# Patient Record
Sex: Female | Born: 1950 | Race: White | Hispanic: No | Smoking: Current every day smoker
Health system: Southern US, Community
[De-identification: ages and names within clinical notes are randomized; demographics above are authoritative.]

## PROBLEM LIST (undated history)

## (undated) DIAGNOSIS — R911 Solitary pulmonary nodule: Secondary | ICD-10-CM

## (undated) DIAGNOSIS — H269 Unspecified cataract: Secondary | ICD-10-CM

## (undated) DIAGNOSIS — J111 Influenza due to unidentified influenza virus with other respiratory manifestations: Secondary | ICD-10-CM

## (undated) DIAGNOSIS — J449 Chronic obstructive pulmonary disease, unspecified: Secondary | ICD-10-CM

## (undated) DIAGNOSIS — Z720 Tobacco use: Secondary | ICD-10-CM

## (undated) DIAGNOSIS — I10 Essential (primary) hypertension: Secondary | ICD-10-CM

## (undated) DIAGNOSIS — E785 Hyperlipidemia, unspecified: Secondary | ICD-10-CM

## (undated) DIAGNOSIS — J9600 Acute respiratory failure, unspecified whether with hypoxia or hypercapnia: Secondary | ICD-10-CM

## (undated) DIAGNOSIS — T7840XA Allergy, unspecified, initial encounter: Secondary | ICD-10-CM

## (undated) DIAGNOSIS — J189 Pneumonia, unspecified organism: Secondary | ICD-10-CM

## (undated) DIAGNOSIS — J42 Unspecified chronic bronchitis: Secondary | ICD-10-CM

## (undated) DIAGNOSIS — J302 Other seasonal allergic rhinitis: Secondary | ICD-10-CM

## (undated) DIAGNOSIS — M1990 Unspecified osteoarthritis, unspecified site: Secondary | ICD-10-CM

## (undated) HISTORY — PX: TUBAL LIGATION: SHX77

## (undated) HISTORY — PX: COLONOSCOPY: SHX174

## (undated) HISTORY — PX: DILATION AND CURETTAGE OF UTERUS: SHX78

## (undated) HISTORY — PX: EYE SURGERY: SHX253

## (undated) HISTORY — DX: Essential (primary) hypertension: I10

## (undated) HISTORY — PX: LUMBAR LAMINECTOMY: SHX95

## (undated) HISTORY — DX: Allergy, unspecified, initial encounter: T78.40XA

## (undated) HISTORY — DX: Hyperlipidemia, unspecified: E78.5

## (undated) HISTORY — DX: Unspecified cataract: H26.9

## (undated) HISTORY — DX: Chronic obstructive pulmonary disease, unspecified: J44.9

## (undated) HISTORY — PX: GANGLION CYST EXCISION: SHX1691

## (undated) HISTORY — DX: Other seasonal allergic rhinitis: J30.2

## (undated) HISTORY — PX: SPINE SURGERY: SHX786

## (undated) HISTORY — DX: Unspecified osteoarthritis, unspecified site: M19.90

---
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
## 1970-06-19 HISTORY — PX: OTHER SURGICAL HISTORY: SHX169

## 1996-06-19 HISTORY — PX: CHOLECYSTECTOMY: SHX55

## 1998-08-31 ENCOUNTER — Other Ambulatory Visit: Admission: RE | Admit: 1998-08-31 | Discharge: 1998-08-31 | Payer: Self-pay | Admitting: Gynecology

## 1999-12-01 ENCOUNTER — Other Ambulatory Visit: Admission: RE | Admit: 1999-12-01 | Discharge: 1999-12-01 | Payer: Self-pay | Admitting: Gynecology

## 2000-11-13 ENCOUNTER — Other Ambulatory Visit: Admission: RE | Admit: 2000-11-13 | Discharge: 2000-11-13 | Payer: Self-pay | Admitting: Gynecology

## 2002-06-19 HISTORY — PX: ROTATOR CUFF REPAIR: SHX139

## 2006-06-19 HISTORY — PX: MENISCECTOMY: SHX123

## 2012-06-19 HISTORY — PX: BREAST BIOPSY: SHX20

## 2012-10-22 DIAGNOSIS — E785 Hyperlipidemia, unspecified: Secondary | ICD-10-CM | POA: Insufficient documentation

## 2012-11-13 DIAGNOSIS — R079 Chest pain, unspecified: Secondary | ICD-10-CM | POA: Insufficient documentation

## 2013-01-22 DIAGNOSIS — D249 Benign neoplasm of unspecified breast: Secondary | ICD-10-CM | POA: Insufficient documentation

## 2014-11-23 DIAGNOSIS — M25561 Pain in right knee: Secondary | ICD-10-CM | POA: Diagnosis not present

## 2014-11-23 DIAGNOSIS — M25562 Pain in left knee: Secondary | ICD-10-CM | POA: Diagnosis not present

## 2014-11-23 DIAGNOSIS — M1990 Unspecified osteoarthritis, unspecified site: Secondary | ICD-10-CM | POA: Diagnosis not present

## 2014-12-08 DIAGNOSIS — M7122 Synovial cyst of popliteal space [Baker], left knee: Secondary | ICD-10-CM | POA: Diagnosis not present

## 2014-12-08 DIAGNOSIS — M25561 Pain in right knee: Secondary | ICD-10-CM | POA: Diagnosis not present

## 2014-12-08 DIAGNOSIS — M13861 Other specified arthritis, right knee: Secondary | ICD-10-CM | POA: Diagnosis not present

## 2014-12-08 DIAGNOSIS — M13862 Other specified arthritis, left knee: Secondary | ICD-10-CM | POA: Diagnosis not present

## 2014-12-08 DIAGNOSIS — M1712 Unilateral primary osteoarthritis, left knee: Secondary | ICD-10-CM | POA: Diagnosis not present

## 2014-12-09 DIAGNOSIS — Z1231 Encounter for screening mammogram for malignant neoplasm of breast: Secondary | ICD-10-CM | POA: Diagnosis not present

## 2015-01-25 DIAGNOSIS — M1990 Unspecified osteoarthritis, unspecified site: Secondary | ICD-10-CM | POA: Diagnosis not present

## 2016-01-10 DIAGNOSIS — Z23 Encounter for immunization: Secondary | ICD-10-CM | POA: Diagnosis not present

## 2016-01-10 DIAGNOSIS — I10 Essential (primary) hypertension: Secondary | ICD-10-CM | POA: Diagnosis not present

## 2016-01-10 DIAGNOSIS — Z6829 Body mass index (BMI) 29.0-29.9, adult: Secondary | ICD-10-CM | POA: Diagnosis not present

## 2016-01-10 DIAGNOSIS — Z0000 Encounter for general adult medical examination without abnormal findings: Secondary | ICD-10-CM | POA: Diagnosis not present

## 2016-01-12 ENCOUNTER — Other Ambulatory Visit (HOSPITAL_COMMUNITY): Payer: Self-pay | Admitting: Internal Medicine

## 2016-01-12 DIAGNOSIS — Z6829 Body mass index (BMI) 29.0-29.9, adult: Secondary | ICD-10-CM | POA: Diagnosis not present

## 2016-01-12 DIAGNOSIS — E663 Overweight: Secondary | ICD-10-CM | POA: Diagnosis not present

## 2016-01-12 DIAGNOSIS — Z23 Encounter for immunization: Secondary | ICD-10-CM | POA: Diagnosis not present

## 2016-01-12 DIAGNOSIS — Z1389 Encounter for screening for other disorder: Secondary | ICD-10-CM | POA: Diagnosis not present

## 2016-01-12 DIAGNOSIS — E039 Hypothyroidism, unspecified: Secondary | ICD-10-CM | POA: Diagnosis not present

## 2016-01-12 DIAGNOSIS — Z1231 Encounter for screening mammogram for malignant neoplasm of breast: Secondary | ICD-10-CM

## 2016-01-14 ENCOUNTER — Ambulatory Visit (HOSPITAL_COMMUNITY)
Admission: RE | Admit: 2016-01-14 | Discharge: 2016-01-14 | Disposition: A | Discharge status: Home / Self Care | Payer: Medicare Other | Source: Ambulatory Visit | Attending: Internal Medicine | Admitting: Internal Medicine

## 2016-01-14 DIAGNOSIS — Z1231 Encounter for screening mammogram for malignant neoplasm of breast: Secondary | ICD-10-CM

## 2016-02-25 ENCOUNTER — Ambulatory Visit: Payer: Self-pay | Admitting: Internal Medicine

## 2016-02-25 ENCOUNTER — Ambulatory Visit (INDEPENDENT_AMBULATORY_CARE_PROVIDER_SITE_OTHER): Payer: Medicare Other | Admitting: Gastroenterology

## 2016-02-25 ENCOUNTER — Encounter: Payer: Self-pay | Admitting: Gastroenterology

## 2016-02-25 ENCOUNTER — Other Ambulatory Visit: Payer: Self-pay

## 2016-02-25 DIAGNOSIS — Z86010 Personal history of colonic polyps: Secondary | ICD-10-CM

## 2016-02-25 MED ORDER — PEG 3350-KCL-NA BICARB-NACL 420 G PO SOLR: 4000.0000 mL | ORAL | 0 refills | Status: DC

## 2016-02-25 NOTE — Progress Notes (Signed)
Primary Care Physician:  Glo Herring., MD  Primary Gastroenterologist:  Garfield Cornea, MD   Chief Complaint  Patient presents with  . Colonoscopy    hx benign polyps    HPI:  Erin Guerrero is a 65 y.o. female here To schedule surveillance colonoscopy. She states she has had 2 previous colonoscopies, last one 8 years ago [facility]). She has had history of colon polyps. At one point she was told follow-up in 5 years. Records are unavailable. She has been doing well from a GI standpoint. Regular bowel movements. Only occasional constipation. Denies blood in the stool or melena. No heartburn, dysphagia, vomiting, abdominal pain. No family history of colon cancer.  Current Outpatient Prescriptions  Medication Sig Dispense Refill  . Calcium Carbonate Antacid (TUMS ULTRA 1000 PO) Take 2 tablets by mouth daily.    . cetirizine (ZYRTEC) 10 MG tablet Take 10 mg by mouth daily.    . Cholecalciferol (VITAMIN D) 2000 units CAPS Take 1 capsule by mouth daily.    . irbesartan (AVAPRO) 150 MG tablet Take 1 tablet by mouth daily.    Marland Kitchen triamcinolone (NASACORT) 55 MCG/ACT AERO nasal inhaler Place 2 sprays into the nose as needed.     No current facility-administered medications for this visit.     Allergies as of 02/25/2016 - Review Complete 02/25/2016  Allergen Reaction Noted  . Diphenhydramine hcl Anaphylaxis 10/22/2012  . Morphine Rash 10/22/2012  . Other  02/25/2016  . Statins Other (See Comments) 02/25/2016  . Benzocaine Rash 10/22/2012  . Latex Rash 02/25/2016  . Sulfa antibiotics Rash 10/22/2012  . Triclosan Rash 10/22/2012    Past Medical History:  Diagnosis Date  . HTN (hypertension)   . Osteoarthritis   . Seasonal allergies     Past Surgical History:  Procedure Laterality Date  . BREAST BIOPSY  2014   Benign  . Cervical cryo-treatment  1972  . CHOLECYSTECTOMY  1998  . COLONOSCOPY     Polyps  . GANGLION CYST EXCISION  1970   Left wrist  . GANGLION CYST EXCISION      Right hand and left ring finger  . [place]  . MENISCECTOMY  2008  . ROTATOR CUFF REPAIR  2004   Right    Family History  Problem Relation Age of Onset  . Cancer Mother 35    Question cervical/ovarian  . Kidney failure Sister 83    born with one kidney, other kidney injured by mva  . Kidney failure Sister   . Lupus Sister   . Diabetes Sister   . Thyroid cancer Daughter   . Celiac disease Neg Hx   . Inflammatory bowel disease Neg Hx     Social History   Social History  . Marital status: Widowed    Spouse name: N/A  . Number of children: N/A  . Years of education: N/A   Occupational History  . PT for [employer], OR/ER nurse    Social History Main Topics  . Smoking status: Current Every Day Smoker  . Smokeless tobacco: Never Used  . Alcohol use No  . Drug use: No  . Sexual activity: Not on file   Other Topics Concern  . Not on file   Social History Narrative  . No narrative on file      ROS:  General: Negative for anorexia, weight loss, fever, chills, fatigue, weakness. Eyes: Negative for vision changes.  ENT: Negative for hoarseness, difficulty swallowing , nasal congestion. CV: Negative  for chest pain, angina, palpitations, dyspnea on exertion, peripheral edema.  Respiratory: Negative for dyspnea at rest, dyspnea on exertion, cough, sputum, wheezing.  GI: See history of present illness. GU:  Negative for dysuria, hematuria, urinary incontinence, urinary frequency, nocturnal urination.  MS: Negative for joint pain, low back pain.  Derm: Negative for rash or itching.  Neuro: Negative for weakness, abnormal sensation, seizure, frequent headaches, memory loss, confusion.  Psych: Negative for anxiety, depression, suicidal ideation, hallucinations.  Endo: Negative for unusual weight change.  Heme: Negative for bruising or bleeding. Allergy: Negative for rash or hives.    Physical Examination:  BP 140/84   Pulse 76   Temp 97.7 F (36.5  C) (Oral)   Ht 5\' 3"  (1.6 m)   Wt 174 lb 12.8 oz (79.3 kg)   BMI 30.96 kg/m    General: Well-nourished, well-developed in no acute distress.  Head: Normocephalic, atraumatic.   Eyes: Conjunctiva pink, no icterus. Mouth: Oropharyngeal mucosa moist and pink , no lesions erythema or exudate. Neck: Supple without thyromegaly, masses, or lymphadenopathy.  Lungs: Clear to auscultation bilaterally.  Heart: Regular rate and rhythm, no murmurs rubs or gallops.  Abdomen: Bowel sounds are normal, nontender, nondistended, no hepatosplenomegaly or masses, no abdominal bruits or    hernia , no rebound or guarding.   Rectal: Deferred Extremities: No lower extremity edema. No clubbing or deformities.  Neuro: Alert and oriented x 4 , grossly normal neurologically.  Skin: Warm and dry, no rash or jaundice.   Psych: Alert and cooperative, normal mood and affect.

## 2016-02-25 NOTE — Patient Instructions (Signed)
1. Colonoscopy as scheduled. Please see separate instructions. 

## 2016-02-25 NOTE — Progress Notes (Signed)
cc'ed to pcp °

## 2016-02-25 NOTE — Assessment & Plan Note (Signed)
65 year old lady who gives a history of colonic polyps, stating she is overdue for her surveillance colonoscopy. Her last colonoscopy was in 2009 per patient, [place]. Plan on a colonoscopy in the near future.  I have discussed the risks, alternatives, benefits with regards to but not limited to the risk of reaction to medication, bleeding, infection, perforation and the patient is agreeable to proceed. Written consent to be obtained.

## 2016-02-28 ENCOUNTER — Other Ambulatory Visit: Payer: Self-pay

## 2016-02-28 ENCOUNTER — Telehealth: Payer: Self-pay

## 2016-02-28 NOTE — Telephone Encounter (Signed)
Noted  

## 2016-02-28 NOTE — Telephone Encounter (Signed)
Pt called to change her TCS from this Thursday to 03/16/16 @ 8:30 am. New instructions are going out in the mail.

## 2016-03-16 ENCOUNTER — Encounter (HOSPITAL_COMMUNITY): Payer: Self-pay | Admitting: *Deleted

## 2016-03-16 ENCOUNTER — Ambulatory Visit (HOSPITAL_COMMUNITY)
Admission: RE | Admit: 2016-03-16 | Discharge: 2016-03-16 | Disposition: A | Discharge status: Home / Self Care | Payer: Medicare Other | Source: Ambulatory Visit | Attending: Internal Medicine | Admitting: Internal Medicine

## 2016-03-16 ENCOUNTER — Encounter (HOSPITAL_COMMUNITY)
Admission: RE | Disposition: A | Discharge status: Home / Self Care | Payer: Self-pay | Source: Ambulatory Visit | Attending: Internal Medicine

## 2016-03-16 DIAGNOSIS — D120 Benign neoplasm of cecum: Secondary | ICD-10-CM

## 2016-03-16 DIAGNOSIS — Z86010 Personal history of colonic polyps: Secondary | ICD-10-CM | POA: Diagnosis not present

## 2016-03-16 DIAGNOSIS — Z79899 Other long term (current) drug therapy: Secondary | ICD-10-CM | POA: Diagnosis not present

## 2016-03-16 DIAGNOSIS — J302 Other seasonal allergic rhinitis: Secondary | ICD-10-CM | POA: Diagnosis not present

## 2016-03-16 DIAGNOSIS — F17200 Nicotine dependence, unspecified, uncomplicated: Secondary | ICD-10-CM | POA: Insufficient documentation

## 2016-03-16 DIAGNOSIS — K5730 Diverticulosis of large intestine without perforation or abscess without bleeding: Secondary | ICD-10-CM | POA: Diagnosis not present

## 2016-03-16 DIAGNOSIS — Z1211 Encounter for screening for malignant neoplasm of colon: Secondary | ICD-10-CM | POA: Insufficient documentation

## 2016-03-16 DIAGNOSIS — I10 Essential (primary) hypertension: Secondary | ICD-10-CM | POA: Diagnosis not present

## 2016-03-16 HISTORY — PX: COLONOSCOPY: SHX5424

## 2016-03-16 HISTORY — PX: POLYPECTOMY: SHX5525

## 2016-03-16 SURGERY — COLONOSCOPY
Anesthesia: Moderate Sedation

## 2016-03-16 MED ORDER — MEPERIDINE HCL 100 MG/ML IJ SOLN
INTRAMUSCULAR | Status: DC
Start: 2016-03-16 — End: 2016-03-16
  Filled 2016-03-16: qty 2

## 2016-03-16 MED ORDER — ONDANSETRON HCL 4 MG/2ML IJ SOLN
INTRAMUSCULAR | Status: DC | PRN
  Administered 2016-03-16: 4 mg via INTRAVENOUS

## 2016-03-16 MED ORDER — ONDANSETRON HCL 4 MG/2ML IJ SOLN
INTRAMUSCULAR | Status: AC
  Filled 2016-03-16: qty 2

## 2016-03-16 MED ORDER — MIDAZOLAM HCL 5 MG/5ML IJ SOLN
INTRAMUSCULAR | Status: DC | PRN
  Administered 2016-03-16 (×2): 2 mg via INTRAVENOUS
  Administered 2016-03-16: 1 mg via INTRAVENOUS

## 2016-03-16 MED ORDER — MEPERIDINE HCL 100 MG/ML IJ SOLN
INTRAMUSCULAR | Status: DC | PRN
  Administered 2016-03-16 (×2): 50 mg via INTRAVENOUS

## 2016-03-16 MED ORDER — SODIUM CHLORIDE 0.9 % IV SOLN
INTRAVENOUS | Status: DC
  Administered 2016-03-16: 1000 mL via INTRAVENOUS

## 2016-03-16 MED ORDER — MIDAZOLAM HCL 5 MG/5ML IJ SOLN
INTRAMUSCULAR | Status: AC
  Filled 2016-03-16: qty 10

## 2016-03-16 MED ORDER — STERILE WATER FOR IRRIGATION IR SOLN
Status: DC | PRN
  Administered 2016-03-16: 08:00:00

## 2016-03-16 NOTE — Discharge Instructions (Signed)
°  Colonoscopy Discharge Instructions  Read the instructions outlined below and refer to this sheet in the next few weeks. These discharge instructions provide you with general information on caring for yourself after you leave the hospital. Your doctor may also give you specific instructions. While your treatment has been planned according to the most current medical practices available, unavoidable complications occasionally occur. If you have any problems or questions after discharge, call Dr. Gala Romney at [phone]. ACTIVITY  You may resume your regular activity, but move at a slower pace for the next 24 hours.   Take frequent rest periods for the next 24 hours.   Walking will help get rid of the air and reduce the bloated feeling in your belly (abdomen).   No driving for 24 hours (because of the medicine (anesthesia) used during the test).    Do not sign any important legal documents or operate any machinery for 24 hours (because of the anesthesia used during the test).  NUTRITION  Drink plenty of fluids.   You may resume your normal diet as instructed by your doctor.   Begin with a light meal and progress to your normal diet. Heavy or fried foods are harder to digest and may make you feel sick to your stomach (nauseated).   Avoid alcoholic beverages for 24 hours or as instructed.  MEDICATIONS  You may resume your normal medications unless your doctor tells you otherwise.  WHAT YOU CAN EXPECT TODAY  Some feelings of bloating in the abdomen.   Passage of more gas than usual.   Spotting of blood in your stool or on the toilet paper.  IF YOU HAD POLYPS REMOVED DURING THE COLONOSCOPY:  No aspirin products for 7 days or as instructed.   No alcohol for 7 days or as instructed.   Eat a soft diet for the next 24 hours.  FINDING OUT THE RESULTS OF YOUR TEST Not all test results are available during your visit. If your test results are not back during the visit, make an appointment  with your caregiver to find out the results. Do not assume everything is normal if you have not heard from your caregiver or the medical facility. It is important for you to follow up on all of your test results.  SEEK IMMEDIATE MEDICAL ATTENTION IF:  You have more than a spotting of blood in your stool.   Your belly is swollen (abdominal distention).   You are nauseated or vomiting.   You have a temperature over 101.   You have abdominal pain or discomfort that is severe or gets worse throughout the day.    Diverticulosis information provided  Polyp information provided  Further recommendations to follow pending review of pathology report

## 2016-03-16 NOTE — H&P (View-Only) (Signed)
Primary Care Physician:  Glo Herring., MD  Primary Gastroenterologist:  Garfield Cornea, MD   Chief Complaint  Patient presents with  . Colonoscopy    hx benign polyps    HPI:  Erin Guerrero is a 65 y.o. female here To schedule surveillance colonoscopy. She states she has had 2 previous colonoscopies, last one 8 years ago [facility]). She has had history of colon polyps. At one point she was told follow-up in 5 years. Records are unavailable. She has been doing well from a GI standpoint. Regular bowel movements. Only occasional constipation. Denies blood in the stool or melena. No heartburn, dysphagia, vomiting, abdominal pain. No family history of colon cancer.  Current Outpatient Prescriptions  Medication Sig Dispense Refill  . Calcium Carbonate Antacid (TUMS ULTRA 1000 PO) Take 2 tablets by mouth daily.    . cetirizine (ZYRTEC) 10 MG tablet Take 10 mg by mouth daily.    . Cholecalciferol (VITAMIN D) 2000 units CAPS Take 1 capsule by mouth daily.    . irbesartan (AVAPRO) 150 MG tablet Take 1 tablet by mouth daily.    Marland Kitchen triamcinolone (NASACORT) 55 MCG/ACT AERO nasal inhaler Place 2 sprays into the nose as needed.     No current facility-administered medications for this visit.     Allergies as of 02/25/2016 - Review Complete 02/25/2016  Allergen Reaction Noted  . Diphenhydramine hcl Anaphylaxis 10/22/2012  . Morphine Rash 10/22/2012  . Other  02/25/2016  . Statins Other (See Comments) 02/25/2016  . Benzocaine Rash 10/22/2012  . Latex Rash 02/25/2016  . Sulfa antibiotics Rash 10/22/2012  . Triclosan Rash 10/22/2012    Past Medical History:  Diagnosis Date  . HTN (hypertension)   . Osteoarthritis   . Seasonal allergies     Past Surgical History:  Procedure Laterality Date  . BREAST BIOPSY  2014   Benign  . Cervical cryo-treatment  1972  . CHOLECYSTECTOMY  1998  . COLONOSCOPY     Polyps  . GANGLION CYST EXCISION  1970   Left wrist  . GANGLION CYST EXCISION      Right hand and left ring finger  . [place]  . MENISCECTOMY  2008  . ROTATOR CUFF REPAIR  2004   Right    Family History  Problem Relation Age of Onset  . Cancer Mother 35    Question cervical/ovarian  . Kidney failure Sister 83    born with one kidney, other kidney injured by mva  . Kidney failure Sister   . Lupus Sister   . Diabetes Sister   . Thyroid cancer Daughter   . Celiac disease Neg Hx   . Inflammatory bowel disease Neg Hx     Social History   Social History  . Marital status: Widowed    Spouse name: N/A  . Number of children: N/A  . Years of education: N/A   Occupational History  . PT for [employer], OR/ER nurse    Social History Main Topics  . Smoking status: Current Every Day Smoker  . Smokeless tobacco: Never Used  . Alcohol use No  . Drug use: No  . Sexual activity: Not on file   Other Topics Concern  . Not on file   Social History Narrative  . No narrative on file      ROS:  General: Negative for anorexia, weight loss, fever, chills, fatigue, weakness. Eyes: Negative for vision changes.  ENT: Negative for hoarseness, difficulty swallowing , nasal congestion. CV: Negative  for chest pain, angina, palpitations, dyspnea on exertion, peripheral edema.  Respiratory: Negative for dyspnea at rest, dyspnea on exertion, cough, sputum, wheezing.  GI: See history of present illness. GU:  Negative for dysuria, hematuria, urinary incontinence, urinary frequency, nocturnal urination.  MS: Negative for joint pain, low back pain.  Derm: Negative for rash or itching.  Neuro: Negative for weakness, abnormal sensation, seizure, frequent headaches, memory loss, confusion.  Psych: Negative for anxiety, depression, suicidal ideation, hallucinations.  Endo: Negative for unusual weight change.  Heme: Negative for bruising or bleeding. Allergy: Negative for rash or hives.    Physical Examination:  BP 140/84   Pulse 76   Temp 97.7 F (36.5  C) (Oral)   Ht 5\' 3"  (1.6 m)   Wt 174 lb 12.8 oz (79.3 kg)   BMI 30.96 kg/m    General: Well-nourished, well-developed in no acute distress.  Head: Normocephalic, atraumatic.   Eyes: Conjunctiva pink, no icterus. Mouth: Oropharyngeal mucosa moist and pink , no lesions erythema or exudate. Neck: Supple without thyromegaly, masses, or lymphadenopathy.  Lungs: Clear to auscultation bilaterally.  Heart: Regular rate and rhythm, no murmurs rubs or gallops.  Abdomen: Bowel sounds are normal, nontender, nondistended, no hepatosplenomegaly or masses, no abdominal bruits or    hernia , no rebound or guarding.   Rectal: Deferred Extremities: No lower extremity edema. No clubbing or deformities.  Neuro: Alert and oriented x 4 , grossly normal neurologically.  Skin: Warm and dry, no rash or jaundice.   Psych: Alert and cooperative, normal mood and affect.

## 2016-03-16 NOTE — Interval H&P Note (Signed)
History and Physical Interval Note:  03/16/2016 8:14 AM  Erin Guerrero  has presented today for surgery, with the diagnosis of Hx colon polyps  The various methods of treatment have been discussed with the patient and family. After consideration of risks, benefits and other options for treatment, the patient has consented to  Procedure(s) with comments: COLONOSCOPY (N/A) - 1:30 PM as a surgical intervention .  The patient's history has been reviewed, patient examined, no change in status, stable for surgery.  I have reviewed the patient's chart and labs.  Questions were answered to the patient's satisfaction.     No change. Surveillance colonoscopy per plan.  The risks, benefits, limitations, alternatives and imponderables have been reviewed with the patient. Questions have been answered. All parties are agreeable.   Manus Rudd

## 2016-03-16 NOTE — Op Note (Signed)
[facility] Patient Name: Erin Guerrero Procedure Date: 03/16/2016 8:08 AM MRN: [id number] Date of Birth: 12-15-1950 Attending MD: Norvel Richards , MD CSN: [id number] Age: 65 Admit Type: Outpatient Procedure:                Colonoscopy with snare polypectomy Indications:              High risk colon cancer surveillance: Personal                            history of colonic polyps Providers:                Norvel Richards, MD, Jeanann Lewandowsky. Gwenlyn Perking RN, RN,                            Janeece Riggers, RN Referring MD:              Medicines:                Midazolam 2 mg IV, Meperidine 50 mg IV, Ondansetron                            4 mg IV Complications:            No immediate complications. Estimated blood loss:                            Minimal. Estimated Blood Loss:     Estimated blood loss was minimal. Procedure:                Pre-Anesthesia Assessment:                           - Prior to the procedure, a History and Physical                            was performed, and patient medications and                            allergies were reviewed. The patient's tolerance of                            previous anesthesia was also reviewed. The risks                            and benefits of the procedure and the sedation                            options and risks were discussed with the patient.                            All questions were answered, and informed consent                            was obtained. Prior Anticoagulants: The patient has  taken no previous anticoagulant or antiplatelet                            agents. ASA Grade Assessment: II - A patient with                            mild systemic disease. After reviewing the risks                            and benefits, the patient was deemed in                            satisfactory condition to undergo the procedure.                           After obtaining informed  consent, the colonoscope                            was passed under direct vision. Throughout the                            procedure, the patient's blood pressure, pulse, and                            oxygen saturations were monitored continuously. The                            [id number] scope was introduced through                            the anus and advanced to the the cecum, identified                            by appendiceal orifice and ileocecal valve. The                            colonoscopy was performed without difficulty. The                            patient tolerated the procedure well. The quality                            of the bowel preparation was adequate. The                            ileocecal valve, appendiceal orifice, and rectum                            were photographed. The patient tolerated the                            procedure well. Scope In: 8:28:06 AM Scope Out: 8:50:12 AM Total Procedure Duration: 0 hours 22 minutes 6 seconds  Findings:  The perianal and digital rectal examinations were normal.      A few small and large-mouthed diverticula were found in the sigmoid       colon.      A 3 mm polyp was found in the cecum. The polyp was semi-pedunculated.       The polyp was removed with a cold snare. Resection and retrieval were       complete. Estimated blood loss was minimal. Impression:               - Diverticulosis in the sigmoid colon.                           - One 3 mm polyp in the cecum, removed with a cold                            snare. Resected and retrieved. Moderate Sedation:      Moderate (conscious) sedation was administered by the endoscopy nurse       and supervised by the endoscopist. The following parameters were       monitored: oxygen saturation, heart rate, blood pressure, respiratory       rate, EKG, adequacy of pulmonary ventilation, and response to care.       Total physician intraservice time was  50 minutes. Recommendation:           - Patient has a contact number available for                            emergencies. The signs and symptoms of potential                            delayed complications were discussed with the                            patient. Return to normal activities tomorrow.                            Written discharge instructions were provided to the                            patient.                           - Advance diet as tolerated.                           - Continue present medications.                           - Repeat colonoscopy date to be determined after                            pending pathology results are reviewed for                            surveillance based on pathology results.                           -  Return to GI office PRN. Procedure Code(s):        --- Professional ---                           [phone], Colonoscopy, flexible; with removal of                            tumor(s), polyp(s), or other lesion(s) by snare                            technique                           99152, Moderate sedation services provided by the                            same physician or other qualified health care                            professional performing the diagnostic or                            therapeutic service that the sedation supports,                            requiring the presence of an independent trained                            observer to assist in the monitoring of the                            patient's level of consciousness and physiological                            status; initial 15 minutes of intraservice time,                            patient age 32 years or older                           [phone], Moderate sedation services; each additional                            15 minutes intraservice time                           99153, Moderate sedation services; each additional                            15  minutes intraservice time Diagnosis Code(s):        --- Professional ---                           Z86.010, Personal history of colonic polyps  D12.0, Benign neoplasm of cecum                           K57.30, Diverticulosis of large intestine without                            perforation or abscess without bleeding CPT copyright 2016 American Medical Association. All rights reserved. The codes documented in this report are preliminary and upon coder review may  be revised to meet current compliance requirements. Cristopher Estimable. Rourk, MD Norvel Richards, MD 03/16/2016 8:56:50 AM This report has been signed electronically. Number of Addenda: 0

## 2016-03-17 ENCOUNTER — Encounter: Payer: Self-pay | Admitting: Internal Medicine

## 2016-03-20 ENCOUNTER — Encounter (HOSPITAL_COMMUNITY): Payer: Self-pay | Admitting: Internal Medicine

## 2016-05-30 DIAGNOSIS — H52223 Regular astigmatism, bilateral: Secondary | ICD-10-CM | POA: Diagnosis not present

## 2016-05-30 DIAGNOSIS — H2513 Age-related nuclear cataract, bilateral: Secondary | ICD-10-CM | POA: Diagnosis not present

## 2016-05-30 DIAGNOSIS — H524 Presbyopia: Secondary | ICD-10-CM | POA: Diagnosis not present

## 2016-05-30 DIAGNOSIS — H5203 Hypermetropia, bilateral: Secondary | ICD-10-CM | POA: Diagnosis not present

## 2016-07-26 DIAGNOSIS — Z6830 Body mass index (BMI) 30.0-30.9, adult: Secondary | ICD-10-CM | POA: Diagnosis not present

## 2016-07-26 DIAGNOSIS — E6609 Other obesity due to excess calories: Secondary | ICD-10-CM | POA: Diagnosis not present

## 2016-07-26 DIAGNOSIS — J069 Acute upper respiratory infection, unspecified: Secondary | ICD-10-CM | POA: Diagnosis not present

## 2016-07-26 DIAGNOSIS — E669 Obesity, unspecified: Secondary | ICD-10-CM | POA: Diagnosis not present

## 2016-07-26 DIAGNOSIS — J111 Influenza due to unidentified influenza virus with other respiratory manifestations: Secondary | ICD-10-CM | POA: Diagnosis not present

## 2016-07-26 DIAGNOSIS — R0602 Shortness of breath: Secondary | ICD-10-CM | POA: Diagnosis not present

## 2016-07-26 DIAGNOSIS — Z1389 Encounter for screening for other disorder: Secondary | ICD-10-CM | POA: Diagnosis not present

## 2016-07-26 DIAGNOSIS — R062 Wheezing: Secondary | ICD-10-CM | POA: Diagnosis not present

## 2016-07-26 DIAGNOSIS — B349 Viral infection, unspecified: Secondary | ICD-10-CM | POA: Diagnosis not present

## 2016-07-26 DIAGNOSIS — J343 Hypertrophy of nasal turbinates: Secondary | ICD-10-CM | POA: Diagnosis not present

## 2016-08-01 ENCOUNTER — Other Ambulatory Visit (HOSPITAL_COMMUNITY): Payer: Self-pay | Admitting: Family Medicine

## 2016-08-01 ENCOUNTER — Ambulatory Visit (HOSPITAL_COMMUNITY)
Admission: RE | Admit: 2016-08-01 | Discharge: 2016-08-01 | Disposition: A | Discharge status: Home / Self Care | Payer: Medicare Other | Source: Ambulatory Visit | Attending: Internal Medicine | Admitting: Internal Medicine

## 2016-08-01 ENCOUNTER — Ambulatory Visit (HOSPITAL_COMMUNITY)
Admission: RE | Admit: 2016-08-01 | Discharge: 2016-08-01 | Disposition: A | Discharge status: Home / Self Care | Payer: Medicare Other | Source: Ambulatory Visit | Attending: Family Medicine | Admitting: Family Medicine

## 2016-08-01 ENCOUNTER — Other Ambulatory Visit (HOSPITAL_COMMUNITY): Payer: Self-pay | Admitting: Internal Medicine

## 2016-08-01 DIAGNOSIS — R918 Other nonspecific abnormal finding of lung field: Secondary | ICD-10-CM | POA: Insufficient documentation

## 2016-08-01 DIAGNOSIS — Z719 Counseling, unspecified: Secondary | ICD-10-CM | POA: Diagnosis not present

## 2016-08-01 DIAGNOSIS — I700 Atherosclerosis of aorta: Secondary | ICD-10-CM | POA: Diagnosis not present

## 2016-08-01 DIAGNOSIS — R0602 Shortness of breath: Secondary | ICD-10-CM

## 2016-08-01 DIAGNOSIS — R911 Solitary pulmonary nodule: Secondary | ICD-10-CM | POA: Diagnosis not present

## 2016-08-01 DIAGNOSIS — R05 Cough: Secondary | ICD-10-CM | POA: Diagnosis not present

## 2016-08-01 DIAGNOSIS — J40 Bronchitis, not specified as acute or chronic: Secondary | ICD-10-CM | POA: Diagnosis not present

## 2016-08-01 DIAGNOSIS — Z6828 Body mass index (BMI) 28.0-28.9, adult: Secondary | ICD-10-CM | POA: Diagnosis not present

## 2016-08-01 LAB — POCT I-STAT CREATININE: Creatinine, Ser: 0.6 mg/dL (ref 0.44–1.00)

## 2016-08-01 MED ORDER — IOPAMIDOL (ISOVUE-300) INJECTION 61%
75.0000 mL | Freq: Once | INTRAVENOUS | Status: AC | PRN
  Administered 2016-08-01: 75 mL via INTRAVENOUS

## 2016-08-04 ENCOUNTER — Emergency Department (HOSPITAL_COMMUNITY): Payer: Medicare Other

## 2016-08-04 ENCOUNTER — Encounter (HOSPITAL_COMMUNITY): Payer: Self-pay | Admitting: Emergency Medicine

## 2016-08-04 DIAGNOSIS — R109 Unspecified abdominal pain: Secondary | ICD-10-CM | POA: Insufficient documentation

## 2016-08-04 DIAGNOSIS — J479 Bronchiectasis, uncomplicated: Secondary | ICD-10-CM | POA: Diagnosis not present

## 2016-08-04 DIAGNOSIS — F17210 Nicotine dependence, cigarettes, uncomplicated: Secondary | ICD-10-CM | POA: Diagnosis not present

## 2016-08-04 DIAGNOSIS — R000 Tachycardia, unspecified: Secondary | ICD-10-CM | POA: Insufficient documentation

## 2016-08-04 DIAGNOSIS — Z9049 Acquired absence of other specified parts of digestive tract: Secondary | ICD-10-CM | POA: Diagnosis not present

## 2016-08-04 DIAGNOSIS — Z87891 Personal history of nicotine dependence: Secondary | ICD-10-CM | POA: Insufficient documentation

## 2016-08-04 DIAGNOSIS — J189 Pneumonia, unspecified organism: Secondary | ICD-10-CM | POA: Diagnosis not present

## 2016-08-04 DIAGNOSIS — J42 Unspecified chronic bronchitis: Secondary | ICD-10-CM | POA: Insufficient documentation

## 2016-08-04 DIAGNOSIS — Z841 Family history of disorders of kidney and ureter: Secondary | ICD-10-CM | POA: Diagnosis not present

## 2016-08-04 DIAGNOSIS — R911 Solitary pulmonary nodule: Secondary | ICD-10-CM | POA: Insufficient documentation

## 2016-08-04 DIAGNOSIS — Z888 Allergy status to other drugs, medicaments and biological substances status: Secondary | ICD-10-CM | POA: Diagnosis not present

## 2016-08-04 DIAGNOSIS — Z882 Allergy status to sulfonamides status: Secondary | ICD-10-CM | POA: Insufficient documentation

## 2016-08-04 DIAGNOSIS — Z833 Family history of diabetes mellitus: Secondary | ICD-10-CM | POA: Diagnosis not present

## 2016-08-04 DIAGNOSIS — Z79899 Other long term (current) drug therapy: Secondary | ICD-10-CM | POA: Insufficient documentation

## 2016-08-04 DIAGNOSIS — I700 Atherosclerosis of aorta: Secondary | ICD-10-CM | POA: Insufficient documentation

## 2016-08-04 DIAGNOSIS — Z885 Allergy status to narcotic agent status: Secondary | ICD-10-CM | POA: Diagnosis not present

## 2016-08-04 DIAGNOSIS — J111 Influenza due to unidentified influenza virus with other respiratory manifestations: Secondary | ICD-10-CM | POA: Insufficient documentation

## 2016-08-04 DIAGNOSIS — N6310 Unspecified lump in the right breast, unspecified quadrant: Secondary | ICD-10-CM | POA: Diagnosis not present

## 2016-08-04 DIAGNOSIS — Z800 Family history of malignant neoplasm of digestive organs: Secondary | ICD-10-CM | POA: Diagnosis not present

## 2016-08-04 DIAGNOSIS — I10 Essential (primary) hypertension: Secondary | ICD-10-CM | POA: Diagnosis present

## 2016-08-04 DIAGNOSIS — R110 Nausea: Secondary | ICD-10-CM | POA: Diagnosis not present

## 2016-08-04 DIAGNOSIS — Z809 Family history of malignant neoplasm, unspecified: Secondary | ICD-10-CM | POA: Diagnosis not present

## 2016-08-04 DIAGNOSIS — Z91040 Latex allergy status: Secondary | ICD-10-CM | POA: Diagnosis not present

## 2016-08-04 DIAGNOSIS — Z840 Family history of diseases of the skin and subcutaneous tissue: Secondary | ICD-10-CM | POA: Diagnosis not present

## 2016-08-04 DIAGNOSIS — Z7982 Long term (current) use of aspirin: Secondary | ICD-10-CM | POA: Insufficient documentation

## 2016-08-04 DIAGNOSIS — J9601 Acute respiratory failure with hypoxia: Principal | ICD-10-CM | POA: Insufficient documentation

## 2016-08-04 DIAGNOSIS — R079 Chest pain, unspecified: Secondary | ICD-10-CM | POA: Diagnosis not present

## 2016-08-04 DIAGNOSIS — Z86010 Personal history of colonic polyps: Secondary | ICD-10-CM | POA: Insufficient documentation

## 2016-08-04 LAB — CBC
HCT: 44.1 % (ref 36.0–46.0)
HEMOGLOBIN: 15.1 g/dL — AB (ref 12.0–15.0)
MCH: 30.8 pg (ref 26.0–34.0)
MCHC: 34.2 g/dL (ref 30.0–36.0)
MCV: 90 fL (ref 78.0–100.0)
Platelets: 219 10*3/uL (ref 150–400)
RBC: 4.9 MIL/uL (ref 3.87–5.11)
RDW: 12.2 % (ref 11.5–15.5)
WBC: 7.7 10*3/uL (ref 4.0–10.5)

## 2016-08-04 LAB — BASIC METABOLIC PANEL
ANION GAP: 13 (ref 5–15)
BUN: 18 mg/dL (ref 6–20)
CHLORIDE: 101 mmol/L (ref 101–111)
CO2: 25 mmol/L (ref 22–32)
CREATININE: 0.83 mg/dL (ref 0.44–1.00)
Calcium: 9.9 mg/dL (ref 8.9–10.3)
GFR calc non Af Amer: >60 mL/min (ref >60)
Glucose, Bld: 96 mg/dL (ref 65–99)
Potassium: 3.6 mmol/L (ref 3.5–5.1)
Sodium: 139 mmol/L (ref 135–145)

## 2016-08-04 LAB — I-STAT TROPONIN, ED: Troponin i, poc: 0 ng/mL (ref 0.00–0.08)

## 2016-08-04 NOTE — ED Triage Notes (Signed)
Pt seen by PCP 1 1/2 weeks ago for sob and diagnosed with flu.  Took Tamiflu.  Continues to have sob, indigestion, decreased appetite, and epigastric pain since being seen by PCP.  Seen again by PCP 3 days ago and had chest x-ray and CT chest.  Reports bilateral shoulder pain and neck pain x 2 days.

## 2016-08-05 ENCOUNTER — Encounter (HOSPITAL_COMMUNITY): Payer: Self-pay | Admitting: Internal Medicine

## 2016-08-05 ENCOUNTER — Observation Stay (HOSPITAL_COMMUNITY)
Admission: EM | Admit: 2016-08-05 | Discharge: 2016-08-06 | Disposition: A | Discharge status: Home / Self Care | Payer: Medicare Other | Attending: Internal Medicine | Admitting: Internal Medicine

## 2016-08-05 DIAGNOSIS — I10 Essential (primary) hypertension: Secondary | ICD-10-CM | POA: Diagnosis present

## 2016-08-05 DIAGNOSIS — J9601 Acute respiratory failure with hypoxia: Secondary | ICD-10-CM

## 2016-08-05 DIAGNOSIS — J189 Pneumonia, unspecified organism: Secondary | ICD-10-CM | POA: Diagnosis present

## 2016-08-05 DIAGNOSIS — R000 Tachycardia, unspecified: Secondary | ICD-10-CM | POA: Diagnosis present

## 2016-08-05 DIAGNOSIS — F17210 Nicotine dependence, cigarettes, uncomplicated: Secondary | ICD-10-CM | POA: Diagnosis present

## 2016-08-05 DIAGNOSIS — Z720 Tobacco use: Secondary | ICD-10-CM

## 2016-08-05 DIAGNOSIS — J42 Unspecified chronic bronchitis: Secondary | ICD-10-CM | POA: Diagnosis present

## 2016-08-05 DIAGNOSIS — J111 Influenza due to unidentified influenza virus with other respiratory manifestations: Secondary | ICD-10-CM | POA: Diagnosis present

## 2016-08-05 DIAGNOSIS — J410 Simple chronic bronchitis: Secondary | ICD-10-CM

## 2016-08-05 DIAGNOSIS — R109 Unspecified abdominal pain: Secondary | ICD-10-CM | POA: Diagnosis present

## 2016-08-05 DIAGNOSIS — J9600 Acute respiratory failure, unspecified whether with hypoxia or hypercapnia: Secondary | ICD-10-CM | POA: Diagnosis present

## 2016-08-05 HISTORY — DX: Pneumonia, unspecified organism: J18.9

## 2016-08-05 HISTORY — DX: Influenza due to unidentified influenza virus with other respiratory manifestations: J11.1

## 2016-08-05 HISTORY — DX: Acute respiratory failure, unspecified whether with hypoxia or hypercapnia: J96.00

## 2016-08-05 HISTORY — DX: Unspecified chronic bronchitis: J42

## 2016-08-05 HISTORY — DX: Tobacco use: Z72.0

## 2016-08-05 HISTORY — DX: Solitary pulmonary nodule: R91.1

## 2016-08-05 LAB — D-DIMER, QUANTITATIVE: D-Dimer, Quant: 0.64 ug/mL-FEU — ABNORMAL HIGH (ref 0.00–0.50)

## 2016-08-05 LAB — HEPATIC FUNCTION PANEL
ALBUMIN: 4 g/dL (ref 3.5–5.0)
ALK PHOS: 76 U/L (ref 38–126)
ALT: 26 U/L (ref 14–54)
AST: 19 U/L (ref 15–41)
Bilirubin, Direct: 0.1 mg/dL (ref 0.1–0.5)
Indirect Bilirubin: 0.7 mg/dL (ref 0.3–0.9)
TOTAL PROTEIN: 7.4 g/dL (ref 6.5–8.1)
Total Bilirubin: 0.8 mg/dL (ref 0.3–1.2)

## 2016-08-05 LAB — TROPONIN I

## 2016-08-05 LAB — LIPASE, BLOOD: Lipase: 21 U/L (ref 11–51)

## 2016-08-05 MED ORDER — CALCIUM-VITAMIN D 600-200 MG-UNIT PO TABS: 2.0000 | ORAL_TABLET | Freq: Every day | ORAL | Status: DC

## 2016-08-05 MED ORDER — TRAZODONE HCL 50 MG PO TABS: 25.0000 mg | ORAL_TABLET | Freq: Every evening | ORAL | Status: DC | PRN

## 2016-08-05 MED ORDER — SENNOSIDES-DOCUSATE SODIUM 8.6-50 MG PO TABS
1.0000 | ORAL_TABLET | Freq: Every evening | ORAL | Status: DC | PRN
  Filled 2016-08-05: qty 1

## 2016-08-05 MED ORDER — CALCIUM CARBONATE 1250 (500 CA) MG PO TABS
1.0000 | ORAL_TABLET | Freq: Every day | ORAL | Status: DC
  Administered 2016-08-06: 500 mg via ORAL
  Filled 2016-08-05 (×2): qty 1

## 2016-08-05 MED ORDER — IRBESARTAN 300 MG PO TABS
150.0000 mg | ORAL_TABLET | Freq: Every day | ORAL | Status: DC
  Administered 2016-08-05 – 2016-08-06 (×2): 150 mg via ORAL
  Filled 2016-08-05 (×2): qty 1

## 2016-08-05 MED ORDER — SODIUM CHLORIDE 0.9% FLUSH
3.0000 mL | Freq: Two times a day (BID) | INTRAVENOUS | Status: DC
  Administered 2016-08-05 – 2016-08-06 (×2): 3 mL via INTRAVENOUS

## 2016-08-05 MED ORDER — ENOXAPARIN SODIUM 40 MG/0.4ML ~~LOC~~ SOLN
40.0000 mg | SUBCUTANEOUS | Status: DC
  Administered 2016-08-05: 40 mg via SUBCUTANEOUS
  Filled 2016-08-05 (×2): qty 0.4

## 2016-08-05 MED ORDER — CHOLECALCIFEROL 10 MCG (400 UNIT) PO TABS
400.0000 [IU] | ORAL_TABLET | Freq: Every day | ORAL | Status: DC
  Administered 2016-08-06: 400 [IU] via ORAL
  Filled 2016-08-05: qty 1

## 2016-08-05 MED ORDER — ONDANSETRON HCL 4 MG PO TABS: 4.0000 mg | ORAL_TABLET | Freq: Four times a day (QID) | ORAL | Status: DC | PRN

## 2016-08-05 MED ORDER — IPRATROPIUM-ALBUTEROL 0.5-2.5 (3) MG/3ML IN SOLN
3.0000 mL | Freq: Once | RESPIRATORY_TRACT | Status: AC
  Administered 2016-08-05: 3 mL via RESPIRATORY_TRACT
  Filled 2016-08-05: qty 3

## 2016-08-05 MED ORDER — LEVOFLOXACIN 500 MG PO TABS
500.0000 mg | ORAL_TABLET | Freq: Every day | ORAL | Status: DC
  Administered 2016-08-06: 500 mg via ORAL
  Filled 2016-08-05 (×2): qty 1

## 2016-08-05 MED ORDER — SODIUM CHLORIDE 0.9 % IV SOLN
INTRAVENOUS | Status: AC
  Administered 2016-08-05: 09:00:00 via INTRAVENOUS

## 2016-08-05 MED ORDER — ONDANSETRON 4 MG PO TBDP
8.0000 mg | ORAL_TABLET | Freq: Once | ORAL | Status: AC
  Administered 2016-08-05: 8 mg via ORAL
  Filled 2016-08-05: qty 2

## 2016-08-05 MED ORDER — ACETAMINOPHEN 650 MG RE SUPP: 650.0000 mg | Freq: Four times a day (QID) | RECTAL | Status: DC | PRN

## 2016-08-05 MED ORDER — IBUPROFEN 400 MG PO TABS
400.0000 mg | ORAL_TABLET | Freq: Four times a day (QID) | ORAL | Status: DC | PRN
  Administered 2016-08-05: 400 mg via ORAL
  Filled 2016-08-05: qty 1

## 2016-08-05 MED ORDER — IPRATROPIUM-ALBUTEROL 0.5-2.5 (3) MG/3ML IN SOLN
3.0000 mL | RESPIRATORY_TRACT | Status: AC
  Administered 2016-08-05 (×2): 3 mL via RESPIRATORY_TRACT
  Filled 2016-08-05 (×3): qty 3

## 2016-08-05 MED ORDER — ASPIRIN EC 81 MG PO TBEC
81.0000 mg | DELAYED_RELEASE_TABLET | Freq: Every day | ORAL | Status: DC
  Administered 2016-08-05 – 2016-08-06 (×2): 81 mg via ORAL
  Filled 2016-08-05 (×2): qty 1

## 2016-08-05 MED ORDER — FAMOTIDINE 20 MG PO TABS
40.0000 mg | ORAL_TABLET | Freq: Once | ORAL | Status: AC
  Administered 2016-08-05: 40 mg via ORAL
  Filled 2016-08-05: qty 2

## 2016-08-05 MED ORDER — GUAIFENESIN ER 600 MG PO TB12
600.0000 mg | ORAL_TABLET | Freq: Two times a day (BID) | ORAL | Status: DC
  Administered 2016-08-05 – 2016-08-06 (×3): 600 mg via ORAL
  Filled 2016-08-05 (×3): qty 1

## 2016-08-05 MED ORDER — METHYLPREDNISOLONE SODIUM SUCC 125 MG IJ SOLR
60.0000 mg | Freq: Four times a day (QID) | INTRAMUSCULAR | Status: DC
Start: 2016-08-05 — End: 2016-08-06
  Administered 2016-08-05 – 2016-08-06 (×4): 60 mg via INTRAVENOUS
  Filled 2016-08-05 (×4): qty 2

## 2016-08-05 MED ORDER — KETOROLAC TROMETHAMINE 15 MG/ML IJ SOLN
15.0000 mg | Freq: Four times a day (QID) | INTRAMUSCULAR | Status: DC | PRN
Start: 2016-08-05 — End: 2016-08-06

## 2016-08-05 MED ORDER — LEVOFLOXACIN 500 MG PO TABS
500.0000 mg | ORAL_TABLET | Freq: Once | ORAL | Status: AC
Start: 2016-08-05 — End: 2016-08-05
  Administered 2016-08-05: 500 mg via ORAL
  Filled 2016-08-05: qty 1

## 2016-08-05 MED ORDER — IPRATROPIUM-ALBUTEROL 0.5-2.5 (3) MG/3ML IN SOLN
3.0000 mL | RESPIRATORY_TRACT | Status: AC
  Administered 2016-08-05 (×2): 3 mL via RESPIRATORY_TRACT
  Filled 2016-08-05 (×2): qty 3

## 2016-08-05 MED ORDER — LORATADINE 10 MG PO TABS
10.0000 mg | ORAL_TABLET | Freq: Every day | ORAL | Status: DC
  Administered 2016-08-05 – 2016-08-06 (×2): 10 mg via ORAL
  Filled 2016-08-05 (×2): qty 1

## 2016-08-05 MED ORDER — ONDANSETRON HCL 4 MG/2ML IJ SOLN: 4.0000 mg | Freq: Four times a day (QID) | INTRAMUSCULAR | Status: DC | PRN

## 2016-08-05 MED ORDER — GI COCKTAIL ~~LOC~~
30.0000 mL | Freq: Once | ORAL | Status: AC
  Administered 2016-08-05: 30 mL via ORAL
  Filled 2016-08-05: qty 30

## 2016-08-05 MED ORDER — ACETAMINOPHEN 325 MG PO TABS: 650.0000 mg | ORAL_TABLET | Freq: Four times a day (QID) | ORAL | Status: DC | PRN

## 2016-08-05 MED ORDER — OMEGA-3-ACID ETHYL ESTERS 1 G PO CAPS
3.0000 g | ORAL_CAPSULE | Freq: Every day | ORAL | Status: DC
  Administered 2016-08-05 – 2016-08-06 (×2): 3 g via ORAL
  Filled 2016-08-05 (×2): qty 3

## 2016-08-05 MED ORDER — METHYLPREDNISOLONE SODIUM SUCC 125 MG IJ SOLR
125.0000 mg | Freq: Once | INTRAMUSCULAR | Status: AC
  Administered 2016-08-05: 125 mg via INTRAVENOUS
  Filled 2016-08-05: qty 2

## 2016-08-05 NOTE — ED Notes (Addendum)
Pt baseline O2 sat at 91%. Upon ambulation 42ft, Pt sat dropped to 77%. Denied dizziness but was winded. Steady gait noted.

## 2016-08-05 NOTE — Progress Notes (Signed)
AMANAH GARGER [id number] Admission Data: 08/05/2016 7:39 PM Attending Provider: Elwin Mocha, MD  [id number] J., MD Consults/ Treatment Team:   Erin Guerrero is a 66 y.o. female patient admitted from ED awake, alert  & orientated  X 3,  Full Code, VSS - Blood pressure (!) 136/57, pulse 97, temperature 97.9 F (36.6 C), temperature source Oral, resp. rate (!) 22, height 5\' 3"  (1.6 m), weight 70.7 kg (155 lb 12.8 oz), SpO2 95 %.,  no c/o shortness of breath, no c/o chest pain, no distress noted. Tele # 31 placed and pt is currently running:sinus tachycardia.   IV site WDL:  antecubital left, condition patent and no redness with a transparent dsg that's clean dry and intact.  Allergies:   Allergies  Allergen Reactions  . Diphenhydramine Hcl Anaphylaxis  . Morphine Rash    Other  . Other     Bleach - contact dermatitis  . Statins Other (See Comments)    Muscle cramps/spasms  . Benzocaine Rash  . Latex Rash  . Sulfa Antibiotics Rash    Muscle cramps.  . Triclosan Rash     Past Medical History:  Diagnosis Date  . Acute respiratory failure ([place])   . CAP (community acquired pneumonia)   . Chronic bronchitis ([place])   . HTN (hypertension)   . Influenza   . Osteoarthritis   . Pulmonary nodule   . Seasonal allergies   . Tobacco use     History:  obtained from chart review. Tobacco/alcohol: denied none  Pt orientation to unit, room and routine. Information packet given to patient/family and safety video watched.  Admission INP armband ID verified with patient/family, and in place. SR up x 2, fall risk assessment complete with Patient and family verbalizing understanding of risks associated with falls. Pt verbalizes an understanding of how to use the call bell and to call for help before getting out of bed.  Skin, clean-dry- intact without evidence of bruising, or skin tears.   No evidence of skin break down noted on exam. color normal, vascularity normal, no rashes or  suspicious lesions, no evidence of bleeding or bruising, no rash, no edema, temperature normal, texture normal, mobility and turgor normal    Will cont to monitor and assist as needed.  Salley Slaughter, RN 08/05/2016 7:39 PM

## 2016-08-05 NOTE — ED Notes (Signed)
Recollect blue top 

## 2016-08-05 NOTE — ED Provider Notes (Signed)
[facility] DEPT Provider Note   CSN: [id number] Arrival date & time: 08/04/16  2022     History   Chief Complaint Chief Complaint  Patient presents with  . Abdominal Pain  . Shoulder Pain  . Neck Pain  . Shortness of Breath    HPI Erin Guerrero is a 66 y.o. female who presents with SOB. PMH significant for current tobacco use, HTN, allergies. She states about 2 weeks ago she felt acutely ill. She went to her PCP's office and was diagnosed with flu and given Tamiflu and Z-pack. She has taken both and states she felt worse and had SOB so she went back to her PCP's office a week later. They performed a CT of her chest which showed mild, nonspecific hazy opacities in the left upper lobe with bronchiectasis. She was subsequently put on Levaquin. She states she has been very SOB with any activity at home. She also endorses generalized weakness, decreased PO intake, nausea, reflux symptoms, and neck and shoulder muscle spasms. Denies fever, chills, body aches, headache, chest pain, cough, lower abdominal pain, vomiting, diarrhea, dysuria. No recent surgery/travel/immobilization, hx of cancer, leg swelling, hemptysis, prior DVT/PE, or hormone use. She has not officially been diagnosed with COPD   HPI  Past Medical History:  Diagnosis Date  . HTN (hypertension)   . Osteoarthritis   . Seasonal allergies     Patient Active Problem List   Diagnosis Date Noted  . History of colonic polyps 02/25/2016    Past Surgical History:  Procedure Laterality Date  . BREAST BIOPSY  2014   Benign  . Cervical cryo-treatment  1972  . CHOLECYSTECTOMY  1998  . COLONOSCOPY     Polyps  . COLONOSCOPY N/A 03/16/2016   Procedure: COLONOSCOPY;  Surgeon: Daneil Dolin, MD;  Location: [facility];  Service: Endoscopy;  Laterality: N/A;  1:30 PM  . DILATION AND CURETTAGE OF UTERUS    . GANGLION CYST EXCISION  1970   Left wrist  . GANGLION CYST EXCISION     Right hand and left ring finger  .  [place]  . MENISCECTOMY  2008  . POLYPECTOMY  03/16/2016   Procedure: POLYPECTOMY;  Surgeon: Daneil Dolin, MD;  Location: [facility];  Service: Endoscopy;;  colon  . ROTATOR CUFF REPAIR  2004   Right    OB History    No data available       Home Medications    Prior to Admission medications   Medication Sig Start Date End Date Taking? Authorizing Provider  albuterol (PROVENTIL HFA;VENTOLIN HFA) 108 (90 Base) MCG/ACT inhaler Inhale 1-2 puffs into the lungs every 6 (six) hours as needed for wheezing or shortness of breath.   Yes Historical Provider, MD  aspirin EC 81 MG tablet Take 81 mg by mouth daily.   Yes Historical Provider, MD  Calcium-Vitamin D 600-200 MG-UNIT tablet Take 2 tablets by mouth daily.   Yes Historical Provider, MD  cetirizine (ZYRTEC) 10 MG tablet Take 10 mg by mouth daily.   Yes Historical Provider, MD  irbesartan (AVAPRO) 150 MG tablet Take 1 tablet by mouth daily. 10/11/12  Yes Historical Provider, MD  levofloxacin (LEVAQUIN) 500 MG tablet Take 500 mg by mouth daily.   Yes Historical Provider, MD  omega-3 acid ethyl esters (LOVAZA) 1 g capsule Take 3 g by mouth daily.   Yes Historical Provider, MD  triamcinolone (NASACORT) 55 MCG/ACT AERO nasal inhaler Place 2 sprays into the  nose daily as needed (allergies).    Yes Historical Provider, MD    Family History Family History  Problem Relation Age of Onset  . Cancer Mother 12    Question cervical/ovarian  . Kidney failure Sister 27    born with one kidney, other kidney injured by mva  . Kidney failure Sister   . Lupus Sister   . Diabetes Sister   . Thyroid cancer Daughter   . Celiac disease Neg Hx   . Inflammatory bowel disease Neg Hx     Social History Social History  Substance Use Topics  . Smoking status: Current Every Day Smoker    Types: Cigarettes  . Smokeless tobacco: Never Used  . Alcohol use No     Allergies   Diphenhydramine hcl; Morphine; Other; Statins;  Benzocaine; Latex; Sulfa antibiotics; and Triclosan   Review of Systems Review of Systems  Constitutional: Positive for activity change, appetite change and fatigue. Negative for chills and fever.  HENT: Negative for congestion and rhinorrhea.   Respiratory: Positive for shortness of breath and wheezing. Negative for cough.   Cardiovascular: Negative for chest pain, palpitations and leg swelling.  Gastrointestinal: Positive for nausea. Negative for abdominal pain, diarrhea and vomiting.       +indigestion/reflux  Genitourinary: Negative for dysuria.  Musculoskeletal: Positive for myalgias and neck pain.  Neurological: Positive for weakness (generalized). Negative for light-headedness and headaches.     Physical Exam Updated Vital Signs BP 162/68   Pulse 65   Temp 98.2 F (36.8 C) (Oral)   Resp 20   Ht 5\' 3"  (1.6 m)   Wt 71.2 kg   SpO2 96%   BMI 27.81 kg/m   Physical Exam  Constitutional: She is oriented to person, place, and time. She appears well-developed and well-nourished. No distress.  HENT:  Head: Normocephalic and atraumatic.  Eyes: Conjunctivae are normal. Pupils are equal, round, and reactive to light. Right eye exhibits no discharge. Left eye exhibits no discharge. No scleral icterus.  Neck: Normal range of motion.  FROM. No midline tenderness  Cardiovascular: Normal rate and regular rhythm.  Exam reveals no gallop and no friction rub.   No murmur heard. Pulmonary/Chest: Effort normal. No respiratory distress. She has wheezes (scattered wheezes and coarse breath sounds). She has no rales. She exhibits no tenderness.  Abdominal: Soft. Bowel sounds are normal. She exhibits no distension and no mass. There is no tenderness. There is no rebound and no guarding. No hernia.  Neurological: She is alert and oriented to person, place, and time.  Skin: Skin is warm and dry.  Psychiatric: She has a normal mood and affect. Her behavior is normal.  Nursing note and vitals  reviewed.    ED Treatments / Results  Labs (all labs ordered are listed, but only abnormal results are displayed) Labs Reviewed  CBC - Abnormal; Notable for the following:       Result Value   Hemoglobin 15.1 (*)    All other components within normal limits  D-DIMER, QUANTITATIVE (NOT AT [facility]) - Abnormal; Notable for the following:    D-Dimer, Quant 0.64 (*)    All other components within normal limits  BASIC METABOLIC PANEL  HEPATIC FUNCTION PANEL  LIPASE, BLOOD  TROPONIN I  I-STAT TROPOININ, ED    EKG  EKG Interpretation  Date/Time:  Friday August 04 2016 20:32:17 EST Ventricular Rate:  69 PR Interval:  164 QRS Duration: 68 QT Interval:  410 QTC Calculation: 439 R Axis:  30 Text Interpretation:  Normal sinus rhythm Normal ECG No old tracing to compare Confirmed by WARD,  DO, KRISTEN [phone]) on 08/05/2016 3:38:53 AM       Radiology Dg Chest 2 View  Result Date: 08/04/2016 CLINICAL DATA:  Initial evaluation for acute chest pain. EXAM: CHEST  2 VIEW COMPARISON:  Prior radiograph and CT from 08/01/2016. FINDINGS: Cardiac and mediastinal silhouettes are stable in size and contour, and remain within normal limits. Lungs normally inflated. Chronic coarsening of the interstitial markings is similar to previous. No focal infiltrates. No pulmonary edema or pleural effusion. No pneumothorax. Known nodule at the peripheral early of the left upper lobe again seen, measuring 11 x 21 mm on today's study, similar to previous. No acute osseous abnormality. Surgical clips overlie the lower left chest. IMPRESSION: 1. No active cardiopulmonary disease. 2. 11 x 21 mm nodule overlying the peripheral left upper lobe, similar to previous studies. Electronically Signed   By: Jeannine Boga M.D.   On: 08/04/2016 21:33    Procedures Procedures (including critical care time)  Medications Ordered in ED Medications  ondansetron (ZOFRAN-ODT) disintegrating tablet 8 mg (8 mg Oral Given  08/05/16 0348)  famotidine (PEPCID) tablet 40 mg (40 mg Oral Given 08/05/16 0348)  ipratropium-albuterol (DUONEB) 0.5-2.5 (3) MG/3ML nebulizer solution 3 mL (3 mLs Nebulization Given 08/05/16 0357)  methylPREDNISolone sodium succinate (SOLU-MEDROL) 125 mg/2 mL injection 125 mg (125 mg Intravenous Given 08/05/16 0605)  ipratropium-albuterol (DUONEB) 0.5-2.5 (3) MG/3ML nebulizer solution 3 mL (3 mLs Nebulization Given 08/05/16 0616)  levofloxacin (LEVAQUIN) tablet 500 mg (500 mg Oral Given 08/05/16 0603)     Initial Impression / Assessment and Plan / ED Course  I have reviewed the triage vital signs and the nursing notes.  Pertinent labs & imaging results that were available during my care of the patient were reviewed by me and considered in my medical decision making (see chart for details).  67 year old female presents with likely COPD exacerbation due to recent respiratory illness. Although resting sats are normal on RA, ambulatory sats have dropped to 77% on RA. Otherwise vitals are normal. Labs are overall unremarkable. CXR unremarkable. She was given breathing tx, steroids, and Levaquin in the ED. Lungs are CTA. D-dimer ordered to r/o VTE.   D-dimer is 0.64. Adjusted for age it is negative. Shared visit with Dr. Leonides Schanz who also evaluated patient. Spoke with Lurlean Leyden, NP with hospitalist service who will admit patient. Appreciate assistance.  Final Clinical Impressions(s) / ED Diagnoses   Final diagnoses:  Essential hypertension  Acute respiratory failure with hypoxia ([place])  Influenza  Community acquired pneumonia, unspecified laterality  Tobacco use  Simple chronic bronchitis ([place])    New Prescriptions New Prescriptions   No medications on file     Recardo Evangelist, PA-C 08/05/16 1654

## 2016-08-05 NOTE — H&P (Signed)
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 female with medical history significant for hypertension, tobacco use, chronic bronchitis, recently treated for influenza and community-acquired pneumonia presents to the emergency department with the chief complaint of persistent worsening shortness of breath. Initial evaluation reveals acute respiratory failure  Information is obtained from the patient. She states that 2 weeks ago she developed worsening shortness of breath fever chills headache. She saw a primary care provider tested positive for influenza was treated with Tamiflu for 10 days and completed a Z-Pak. She reports the "flulike symptoms" improved but the shortness of breath persisted and worsened. She was given Levaquin and is currently taking Levaquin. She denies chest pain palpitations cough fever chills headache syncope or near-syncope. She denies lower extremity edema but does endorse some orthopnea. She continues to smoke and has not been officially diagnosed with COPD but reports "chronic bronchitis". She has an inhaler that she uses on occasion but here lately it has not helped. She also reports abdominal pain non-radiating in epigastric area relieved with belching. she endorses some nausea without vomiting. She denies dysuria hematuria frequency or urgency. She denies diarrhea constipation melena or bright red blood per rectum. He states she normally has a slight wheeze but over the last 2 days this has diminished.  ED Course: Emergency department 66 she's afebrile hemodynamically stable with hypoxia with ambulation. Oxygen saturation level dropped to the 70s with ambulation. At rest oxygen saturation level XCII on room air. She is provided with continuous DuoNeb's 2 125 mg of Solu-Medrol and  Levaquin.  Review of Systems: As per HPI otherwise 10 point review of systems negative.   Ambulatory Status: Related independent lives by herself independent with ADLs  Past Medical History:  Diagnosis Date  . Acute respiratory failure ([place])   . CAP (community acquired pneumonia)   . Chronic bronchitis ([place])   . HTN (hypertension)   . Influenza   . Osteoarthritis   . Pulmonary nodule   . Seasonal allergies   . Tobacco use     Past Surgical History:  Procedure Laterality Date  . BREAST BIOPSY  2014   Benign  . Cervical cryo-treatment  1972  . CHOLECYSTECTOMY  1998  . COLONOSCOPY     Polyps  . COLONOSCOPY N/A 03/16/2016   Procedure: COLONOSCOPY;  Surgeon: Daneil Dolin, MD;  [facility];  Service: Endoscopy;  Laterality: N/A;  1:30 PM  . DILATION AND CURETTAGE OF UTERUS    . GANGLION CYST EXCISION  1970   Left wrist  . GANGLION CYST EXCISION     Right hand and left ring finger  . [place]  . MENISCECTOMY  2008  . POLYPECTOMY  03/16/2016   Procedure: POLYPECTOMY;  Surgeon: Daneil Dolin, MD;  Location[facility];  Service: Endoscopy;;  colon  . ROTATOR CUFF REPAIR  2004   Right    Social History   Social History  . Marital status: Widowed    Spouse name: N/A  . Number of children: N/A  . Years of education: N/A   Occupational History  . PT for [employer], OR/ER nurse    Social History Main Topics  . Smoking status: Current Every Day Smoker    Types: Cigarettes  . Smokeless tobacco: Never Used  .  Alcohol use No  . Drug use: No  . Sexual activity: Not on file   Other Topics Concern  . Not on file   Social History Narrative  . No narrative on file    Allergies  Allergen Reactions  . Diphenhydramine Hcl Anaphylaxis  . Morphine Rash    Other  . Other     Bleach - contact dermatitis  . Statins Other (See Comments)    Muscle cramps/spasms  . Benzocaine Rash  . Latex Rash  . Sulfa Antibiotics Rash    Muscle cramps.    . Triclosan Rash    Family History  Problem Relation Age of Onset  . Cancer Mother 40    Question cervical/ovarian  . Kidney failure Sister 42    born with one kidney, other kidney injured by mva  . Kidney failure Sister   . Lupus Sister   . Diabetes Sister   . Thyroid cancer Daughter   . Celiac disease Neg Hx   . Inflammatory bowel disease Neg Hx     Prior to Admission medications   Medication Sig Start Date End Date Taking? Authorizing Provider  albuterol (PROVENTIL HFA;VENTOLIN HFA) 108 (90 Base) MCG/ACT inhaler Inhale 1-2 puffs into the lungs every 6 (six) hours as needed for wheezing or shortness of breath.   Yes Historical Provider, MD  aspirin EC 81 MG tablet Take 81 mg by mouth daily.   Yes Historical Provider, MD  Calcium-Vitamin D 600-200 MG-UNIT tablet Take 2 tablets by mouth daily.   Yes Historical Provider, MD  cetirizine (ZYRTEC) 10 MG tablet Take 10 mg by mouth daily.   Yes Historical Provider, MD  irbesartan (AVAPRO) 150 MG tablet Take 1 tablet by mouth daily. 10/11/12  Yes Historical Provider, MD  levofloxacin (LEVAQUIN) 500 MG tablet Take 500 mg by mouth daily.   Yes Historical Provider, MD  omega-3 acid ethyl esters (LOVAZA) 1 g capsule Take 3 g by mouth daily.   Yes Historical Provider, MD  triamcinolone (NASACORT) 55 MCG/ACT AERO nasal inhaler Place 2 sprays into the nose daily as needed (allergies).    Yes Historical Provider, MD    Physical Exam: Vitals:   08/05/16 0700 08/05/16 0800 08/05/16 0830 08/05/16 0900  BP:  136/72 129/63 139/70  Pulse:  98 113 (!) 112  Resp:  21 18 20   Temp:    98.2 F (36.8 C)  TempSrc:    Oral  SpO2: 93% 92% 93% 94%  Weight:   70.7 kg (155 lb 12.8 oz)   Height:   5\' 3"  (1.6 m)      General:  Appears Slightly irritable but cooperative appears somewhat uncomfortable repositioning self in bed Eyes:  PERRL, EOMI, normal lids, iris ENT:  grossly normal hearing, lips & tongue, mucous membranes of her mouth are pink slightly  dry Neck:  no LAD, masses or thyromegaly Cardiovascular:  Tachycardic but regular, no m/r/g. No LE edema. Pedal pulses present and palpable Respiratory:  Mild increased work of breathing with conversation. Breath sounds somewhat diminished throughout at this point I hear no wheeze no crackles. Abdomen:  soft, ntnd, +BS nontender Skin:  no rash or induration seen on limited exam Musculoskeletal:  grossly normal tone BUE/BLE, good ROM, no bony abnormality Psychiatric:  grossly normal mood and affect, speech fluent and appropriate, AOx3 Neurologic:  CN 2-12 grossly intact, moves all extremities in coordinated fashion, sensation intact  Labs on Admission: I have personally reviewed following labs and imaging studies  CBC:  Recent  Labs Lab 08/04/16 2054  WBC 7.7  HGB 15.1*  HCT 44.1  MCV 90.0  PLT [id number]   Basic Metabolic Panel:  Recent Labs Lab 08/01/16 1806 08/04/16 2054  NA  --  139  K  --  3.6  CL  --  101  CO2  --  25  GLUCOSE  --  96  BUN  --  18  CREATININE 0.60 0.83  CALCIUM  --  9.9   GFR: Estimated Creatinine Clearance: 62.8 mL/min (by C-G formula based on SCr of 0.83 mg/dL). Liver Function Tests:  Recent Labs Lab 08/05/16 0306  AST 19  ALT 26  ALKPHOS 76  BILITOT 0.8  PROT 7.4  ALBUMIN 4.0    Recent Labs Lab 08/05/16 0306  LIPASE 21   No results for input(s): AMMONIA in the last 168 hours. Coagulation Profile: No results for input(s): INR, PROTIME in the last 168 hours. Cardiac Enzymes: No results for input(s): CKTOTAL, CKMB, CKMBINDEX, TROPONINI in the last 168 hours. BNP (last 3 results) No results for input(s): PROBNP in the last 8760 hours. HbA1C: No results for input(s): HGBA1C in the last 72 hours. CBG: No results for input(s): GLUCAP in the last 168 hours. Lipid Profile: No results for input(s): CHOL, HDL, LDLCALC, TRIG, CHOLHDL, LDLDIRECT in the last 72 hours. Thyroid Function Tests: No results for input(s): TSH, T4TOTAL, FREET4,  T3FREE, THYROIDAB in the last 72 hours. Anemia Panel: No results for input(s): VITAMINB12, FOLATE, FERRITIN, TIBC, IRON, RETICCTPCT in the last 72 hours. Urine analysis: No results found for: COLORURINE, APPEARANCEUR, LABSPEC, PHURINE, GLUCOSEU, HGBUR, BILIRUBINUR, KETONESUR, PROTEINUR, UROBILINOGEN, NITRITE, LEUKOCYTESUR  Creatinine Clearance: Estimated Creatinine Clearance: 62.8 mL/min (by C-G formula based on SCr of 0.83 mg/dL).  Sepsis Labs: @LABRCNTIP (procalcitonin:4,lacticidven:4) )No results found for this or any previous visit (from the past 240 hour(s)).   Radiological Exams on Admission: Dg Chest 2 View  Result Date: 08/04/2016 CLINICAL DATA:  Initial evaluation for acute chest pain. EXAM: CHEST  2 VIEW COMPARISON:  Prior radiograph and CT from 08/01/2016. FINDINGS: Cardiac and mediastinal silhouettes are stable in size and contour, and remain within normal limits. Lungs normally inflated. Chronic coarsening of the interstitial markings is similar to previous. No focal infiltrates. No pulmonary edema or pleural effusion. No pneumothorax. Known nodule at the peripheral early of the left upper lobe again seen, measuring 11 x 21 mm on today's study, similar to previous. No acute osseous abnormality. Surgical clips overlie the lower left chest. IMPRESSION: 1. No active cardiopulmonary disease. 2. 11 x 21 mm nodule overlying the peripheral left upper lobe, similar to previous studies. Electronically Signed   By: Jeannine Boga M.D.   On: 08/04/2016 21:33    EKG: Independently reviewed. Normal sinus rhythm Normal ECG No old tracing to compare  Assessment/Plan Principal Problem:   Acute respiratory failure (HCC) Active Problems:   HTN (hypertension)   Influenza   CAP (community acquired pneumonia)   Tachycardia   Tobacco use   Chronic bronchitis (HCC)   Abdominal pain   1. Acute respiratory failure with hypoxia likely related to recent community-acquired pneumonia/influenza  in the setting of chronic bronchitis in a tobacco user. Oxygen saturation level drops to 70s with ambulation on room air. Chest x-ray with no active cardiopulmonary disease but with nodule that appears stable. She is afebrile no leukocytosis and dynamically stable and not hypoxic. D-dimer when adjusted for age within limits of normal. -Admit to telemetry -Scheduled nebulizers -Steroids -Levaquin -IS -Continue oxygen supplementation -Monitor oxygen  saturation level -Wean oxygen as able -Would likely benefit from outpatient pulmonary function tests  #2. Tachycardia. EKG normal sinus rhythm. No chest pain. Likely related to nebulizers. Heart rate was 63 upon presentation. -Monitor -check troponin  #3.  Recent history of Community-acquired pneumonia/influenza. Completed Tamiflu. Completed Z-Pak. Reports flulike symptoms resolved. Given Levaquin for persistent cough. No leukocytosis afebrile hemodynamically stable  #4., Abdominal pain/nausea. No vomiting. Pain is nonradiating no diaphoresis. Reports improvement with belching. EKG was normal sinus rhythm. He is provided with Zantac -Obtain initial troponin -Monitor on telemetry -GI cocktail  5. Tobacco use. -Cessation counseling offered   DVT prophylaxis: lovenox Code Status: full  Family Communication: daughter at bedside  Disposition Plan: home hopefully tomorrow  Consults called: none  Admission status: obs    Dyanne Carrel M MD [facility]  If 7PM-7AM, please contact night-coverage [email or web address] Password [email or web address]  08/05/2016, 9:03 AM

## 2016-08-05 NOTE — ED Provider Notes (Signed)
Medical screening examination/treatment/procedure(s) were conducted as a shared visit with non-physician practitioner(s) and myself.  I personally evaluated the patient during the encounter.   EKG Interpretation  Date/Time:  Friday August 04 2016 20:32:17 EST Ventricular Rate:  69 PR Interval:  164 QRS Duration: 68 QT Interval:  410 QTC Calculation: 439 R Axis:   30 Text Interpretation:  Normal sinus rhythm Normal ECG No old tracing to compare Confirmed by WARD,  DO, KRISTEN ([id number]) on 08/05/2016 3:38:53 AM      Pt is a 66 y.o. female with history of tobacco use, wheezing who presents emergency department with shortness of breath that is progressively worsening with exertion. No chest pain currently. Not on oxygen at home. Does use albuterol at home without relief. Has been on Levaquin for pneumonia. Recently also had influenza. Here patient is wheezing but not in distress with rest. When she ambulates however her sats drop into the 70s. She has received DuoNeb treatments, Solu-Medrol. Chest x-ray shows no new infiltrate or edema. Troponin is negative. Her age-adjusted d-dimer is negative. Suspect possible COPD. I feel she will need admission given she does not wear oxygen at home.   [place], DO 08/05/16 [phone]

## 2016-08-05 NOTE — ED Notes (Signed)
Pt states she has been nauseated for almost 2 weeks and has been dealing with SOB for almost the same time. Pt was diagnosed with pneumonia and then the flu. Pt is currently taking Levaquin and has had tamiflu.

## 2016-08-06 ENCOUNTER — Telehealth: Payer: Self-pay | Admitting: Emergency Medicine

## 2016-08-06 DIAGNOSIS — J9601 Acute respiratory failure with hypoxia: Secondary | ICD-10-CM | POA: Diagnosis not present

## 2016-08-06 LAB — CBC
HEMATOCRIT: 39.1 % (ref 36.0–46.0)
Hemoglobin: 13.4 g/dL (ref 12.0–15.0)
MCH: 30.9 pg (ref 26.0–34.0)
MCHC: 34.3 g/dL (ref 30.0–36.0)
MCV: 90.1 fL (ref 78.0–100.0)
Platelets: 211 10*3/uL (ref 150–400)
RBC: 4.34 MIL/uL (ref 3.87–5.11)
RDW: 12.4 % (ref 11.5–15.5)
WBC: 15.9 10*3/uL — ABNORMAL HIGH (ref 4.0–10.5)

## 2016-08-06 MED ORDER — GUAIFENESIN ER 600 MG PO TB12: 600.0000 mg | ORAL_TABLET | Freq: Two times a day (BID) | ORAL | 0 refills | Status: DC

## 2016-08-06 MED ORDER — PREDNISONE 10 MG PO TABS: ORAL_TABLET | ORAL | 0 refills | Status: DC

## 2016-08-06 MED ORDER — BISACODYL 5 MG PO TBEC
5.0000 mg | DELAYED_RELEASE_TABLET | Freq: Once | ORAL | Status: AC
  Administered 2016-08-06: 5 mg via ORAL
  Filled 2016-08-06: qty 1

## 2016-08-06 MED ORDER — DOCUSATE SODIUM 100 MG PO CAPS: 100.0000 mg | ORAL_CAPSULE | Freq: Two times a day (BID) | ORAL | 0 refills | Status: DC | PRN

## 2016-08-06 MED ORDER — TIOTROPIUM BROMIDE MONOHYDRATE 18 MCG IN CAPS: 18.0000 ug | ORAL_CAPSULE | Freq: Every day | RESPIRATORY_TRACT | 0 refills | Status: DC

## 2016-08-06 MED ORDER — BISACODYL 10 MG RE SUPP: 10.0000 mg | Freq: Every day | RECTAL | Status: DC | PRN

## 2016-08-06 MED ORDER — DOCUSATE SODIUM 100 MG PO CAPS
100.0000 mg | ORAL_CAPSULE | Freq: Two times a day (BID) | ORAL | Status: DC | PRN
Start: 2016-08-06 — End: 2016-08-06

## 2016-08-06 NOTE — Progress Notes (Signed)
Nsg Discharge Note  Admit Date:  08/05/2016 Discharge date: 08/06/2016   CIARA SOULIA to be D/C'd Home per MD order.  AVS completed.  Copy for chart, and copy for patient signed, and dated. Patient/caregiver able to verbalize understanding.  Discharge Medication: Allergies as of 08/06/2016      Reactions   Diphenhydramine Hcl Anaphylaxis   Morphine Rash   Other   Other    Bleach - contact dermatitis   Statins Other (See Comments)   Muscle cramps/spasms   Benzocaine Rash   Latex Rash   Sulfa Antibiotics Rash   Muscle cramps.   Triclosan Rash      Medication List    TAKE these medications   albuterol 108 (90 Base) MCG/ACT inhaler Commonly known as:  PROVENTIL HFA;VENTOLIN HFA Inhale 1-2 puffs into the lungs every 6 (six) hours as needed for wheezing or shortness of breath.   aspirin EC 81 MG tablet Take 81 mg by mouth daily.   Calcium-Vitamin D 600-200 MG-UNIT tablet Take 2 tablets by mouth daily.   cetirizine 10 MG tablet Commonly known as:  ZYRTEC Take 10 mg by mouth daily.   docusate sodium 100 MG capsule Commonly known as:  COLACE Take 1 capsule (100 mg total) by mouth 2 (two) times daily as needed for mild constipation.   guaiFENesin 600 MG 12 hr tablet Commonly known as:  MUCINEX Take 1 tablet (600 mg total) by mouth 2 (two) times daily.   irbesartan 150 MG tablet Commonly known as:  AVAPRO Take 1 tablet by mouth daily.   levofloxacin 500 MG tablet Commonly known as:  LEVAQUIN Take 500 mg by mouth daily.   omega-3 acid ethyl esters 1 g capsule Commonly known as:  LOVAZA Take 3 g by mouth daily.   predniSONE 10 MG tablet Commonly known as:  DELTASONE Take 40mg  daily for 3days,Take 30mg  daily for 3days,Take 20mg  daily for 3days,Take 10mg  daily for 3days, then stop.   tiotropium 18 MCG inhalation capsule Commonly known as:  SPIRIVA HANDIHALER Place 1 capsule (18 mcg total) into inhaler and inhale daily.   triamcinolone 55 MCG/ACT Aero nasal  inhaler Commonly known as:  NASACORT Place 2 sprays into the nose daily as needed (allergies).       Discharge Assessment: Vitals:   08/05/16 2000 08/06/16 0546  BP: 128/60 (!) 117/55  Pulse: 97 85  Resp: 20 18  Temp: 98 F (36.7 C) 97.8 F (36.6 C)   Skin clean, dry and intact without evidence of skin break down, no evidence of skin tears noted. IV catheter discontinued intact. Site without signs and symptoms of complications - no redness or edema noted at insertion site, patient denies c/o pain - only slight tenderness at site.  Dressing with slight pressure applied.  D/c Instructions-Education: Discharge instructions given to patient/family with verbalized understanding. D/c education completed with patient/family including follow up instructions, medication list, d/c activities limitations if indicated, with other d/c instructions as indicated by MD - patient able to verbalize understanding, all questions fully answered. Patient instructed to return to ED, call 911, or call MD for any changes in condition.  Patient escorted via [place], and D/C home via private auto.  Salley Slaughter, RN 08/06/2016 11:55 AM

## 2016-08-06 NOTE — Progress Notes (Signed)
Pt ambulated around the unit aprox. 150' without distress. 02 sats remained 98-100%

## 2016-08-06 NOTE — Telephone Encounter (Signed)
Patient being d/c from hospital 2/19, needs Pulm consult ASAP for pulm nodule.   Please set up with any Pulm provider, to be seen asap, call her to arrange. Thanks

## 2016-08-07 NOTE — Telephone Encounter (Signed)
She needs the next available consult slot for pulm nodule, any provider, even if not this week.

## 2016-08-07 NOTE — Telephone Encounter (Signed)
Spoke with the pt and scheduled appt with MW for 11:15 am on 08/10/16

## 2016-08-07 NOTE — Telephone Encounter (Signed)
We don't have any available appointments this week for consults.  RB - please advise. Thanks.

## 2016-08-08 NOTE — Discharge Summary (Signed)
[facility] Discharge Summary   Patient: Erin Guerrero [id number]   PCP: Glo Herring., MD DOB: [birth year]   Date of admission: 08/05/2016   Date of discharge: 08/06/2016     Discharge Diagnoses:  Principal Problem:   Acute respiratory failure ([place]) Active Problems:   HTN (hypertension)   Influenza   CAP (community acquired pneumonia)   Tachycardia   Tobacco use   Chronic bronchitis ([place])   Abdominal pain   Admitted From: home Disposition:  home  Recommendations for Outpatient Follow-up:  1. Follow-up with PCP in one week 2. Establish care with pulmonary for pulmonary nodule   Follow-up Information    FUSCO,LAWRENCE J., MD. Schedule an appointment as soon as possible for a visit in 1 week(s).   Specialty:  Internal Medicine Contact information: [address] [place] [id number] [phone]        Collene Gobble., MD. Call in 2 week(s).   Specialty:  Pulmonary Disease Why:  to establish care.  Contact information: [address] [address] [phone]          Diet recommendation: Cardiac diet  Activity: The patient is advised to gradually reintroduce usual activities.  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 female with medical history significant for hypertension, tobacco use, chronic bronchitis, recently treated for influenza and community-acquired pneumonia presents to the emergency department with the chief complaint of persistent worsening shortness of breath. Initial evaluation reveals acute respiratory failure  Information is obtained from the patient. She states that 2 weeks ago she developed worsening shortness of breath fever chills headache. She saw a primary care provider tested positive for influenza was treated with Tamiflu for 10 days and completed a Z-Pak. She  reports the "flulike symptoms" improved but the shortness of breath persisted and worsened. She was given Levaquin and is currently taking Levaquin. She denies chest pain palpitations cough fever chills headache syncope or near-syncope. She denies lower extremity edema but does endorse some orthopnea. She continues to smoke and has not been officially diagnosed with COPD but reports "chronic bronchitis". She has an inhaler that she uses on occasion but here lately it has not helped. She also reports abdominal pain non-radiating in epigastric area relieved with belching. she endorses some nausea without vomiting. She denies dysuria hematuria frequency or urgency. She denies diarrhea constipation melena or bright red blood per rectum. He states she normally has a slight wheeze but over the last 2 days this has diminished."  Hospital Course:   Summary of her active problems in the hospital is as following. 1. Acute respiratory failure with hypoxia likely related to recent community-acquired pneumonia/influenza in the setting of chronic bronchitis in a tobacco user.  Oxygen saturation level dropped to 70s in ER with ambulation on room air. Chest x-ray with no active cardiopulmonary disease but with nodule that appears stable.  She is afebrile no leukocytosis and dynamically stable and not hypoxic.  D-dimer when adjusted for age within limits of normal. We will continue regimen initiated with inhalers, steroids and antibiotics.  2. Pulmonary nodule. Discussed with pulmonary, patient will follow-up with outpatient for further workup. May be amenable to CT-guided biopsy.  3. Tachycardia. Sinus Due to inhalers. Monitor.  4., Abdominal pain/nausea. Resolved.  5. Tobacco use. -Cessation counseling offered  All other chronic medical condition were  stable during the hospitalization.  Patient was ambulatory without any assistance. On the day of the discharge the patient's vitals were stable, and no  other acute medical condition were reported by patient. the patient was felt safe to be discharge at home with family.  Procedures and Results:  none   Consultations:  none  DISCHARGE MEDICATION: Discharge Medication List as of 08/06/2016 11:52 AM    START taking these medications   Details  docusate sodium (COLACE) 100 MG capsule Take 1 capsule (100 mg total) by mouth 2 (two) times daily as needed for mild constipation., Starting Sun 08/06/2016, Normal    guaiFENesin (MUCINEX) 600 MG 12 hr tablet Take 1 tablet (600 mg total) by mouth 2 (two) times daily., Starting Sun 08/06/2016, Normal    predniSONE (DELTASONE) 10 MG tablet Take 40mg  daily for 3days,Take 30mg  daily for 3days,Take 20mg  daily for 3days,Take 10mg  daily for 3days, then stop., Normal    tiotropium (SPIRIVA HANDIHALER) 18 MCG inhalation capsule Place 1 capsule (18 mcg total) into inhaler and inhale daily., Starting Sun 08/06/2016, Normal      CONTINUE these medications which have NOT CHANGED   Details  albuterol (PROVENTIL HFA;VENTOLIN HFA) 108 (90 Base) MCG/ACT inhaler Inhale 1-2 puffs into the lungs every 6 (six) hours as needed for wheezing or shortness of breath., Historical Med    aspirin EC 81 MG tablet Take 81 mg by mouth daily., Historical Med    Calcium-Vitamin D 600-200 MG-UNIT tablet Take 2 tablets by mouth daily., Historical Med    cetirizine (ZYRTEC) 10 MG tablet Take 10 mg by mouth daily., Historical Med    irbesartan (AVAPRO) 150 MG tablet Take 1 tablet by mouth daily., Starting Fri 10/11/2012, Historical Med    levofloxacin (LEVAQUIN) 500 MG tablet Take 500 mg by mouth daily., Historical Med    omega-3 acid ethyl esters (LOVAZA) 1 g capsule Take 3 g by mouth daily., Historical Med    triamcinolone (NASACORT) 55 MCG/ACT AERO nasal inhaler Place 2 sprays into the nose daily as needed (allergies). , Historical Med       Allergies  Allergen Reactions  . Diphenhydramine Hcl Anaphylaxis  . Morphine  Rash    Other  . Other     Bleach - contact dermatitis  . Statins Other (See Comments)    Muscle cramps/spasms  . Benzocaine Rash  . Latex Rash  . Sulfa Antibiotics Rash    Muscle cramps.  . Triclosan Rash   Discharge Instructions    Diet - low sodium heart healthy    Complete by:  As directed    Discharge instructions    Complete by:  As directed    It is important that you read following instructions as well as go over your medication list with RN to help you understand your care after this hospitalization.  Discharge Instructions: Please follow-up with PCP in one week  Please request your primary care physician to go over all Hospital Tests and Procedure/Radiological results at the follow up,  Please get all Hospital records sent to your PCP by signing hospital release before you go home.   Do not take more than prescribed Pain, Sleep and Anxiety Medications. You were cared for by a hospitalist during your hospital stay. If you have any questions about your discharge medications or the care you received while you were in the hospital after you are discharged, you can call the unit and ask to speak with the hospitalist on call if the hospitalist that  took care of you is not available.  Once you are discharged, your primary care physician will handle any further medical issues. Please note that NO REFILLS for any discharge medications will be authorized once you are discharged, as it is imperative that you return to your primary care physician (or establish a relationship with a primary care physician if you do not have one) for your aftercare needs so that they can reassess your need for medications and monitor your lab values. You Must read complete instructions/literature along with all the possible adverse reactions/side effects for all the Medicines you take and that have been prescribed to you. Take any new Medicines after you have completely understood and accept all the possible  adverse reactions/side effects. Wear Seat belts while driving. If you have smoked or chewed Tobacco in the last 2 yrs please stop smoking and/or stop any Recreational drug use.   Increase activity slowly    Complete by:  As directed      Discharge Exam: Filed Weights   08/05/16 0254 08/05/16 0830  Weight: 71.2 kg (157 lb) 70.7 kg (155 lb 12.8 oz)   Vitals:   08/05/16 2000 08/06/16 0546  BP: 128/60 (!) 117/55  Pulse: 97 85  Resp: 20 18  Temp: 98 F (36.7 C) 97.8 F (36.6 C)   General: Appear in no distress, no Rash; Oral Mucosa moist. Cardiovascular: S1 and S2 Present, no Murmur, no JVD Respiratory: Bilateral Air entry present and Clear to Auscultation, no Crackles, no wheezes Abdomen: Bowel Sound present, Soft and no tenderness Extremities: no Pedal edema, no calf tenderness Neurology: Grossly no focal neuro deficit.  The results of significant diagnostics from this hospitalization (including imaging, microbiology, ancillary and laboratory) are listed below for reference.    Significant Diagnostic Studies: Dg Chest 2 View  Result Date: 08/04/2016 CLINICAL DATA:  Initial evaluation for acute chest pain. EXAM: CHEST  2 VIEW COMPARISON:  Prior radiograph and CT from 08/01/2016. FINDINGS: Cardiac and mediastinal silhouettes are stable in size and contour, and remain within normal limits. Lungs normally inflated. Chronic coarsening of the interstitial markings is similar to previous. No focal infiltrates. No pulmonary edema or pleural effusion. No pneumothorax. Known nodule at the peripheral early of the left upper lobe again seen, measuring 11 x 21 mm on today's study, similar to previous. No acute osseous abnormality. Surgical clips overlie the lower left chest. IMPRESSION: 1. No active cardiopulmonary disease. 2. 11 x 21 mm nodule overlying the peripheral left upper lobe, similar to previous studies. Electronically Signed   By: Jeannine Boga M.D.   On: 08/04/2016 21:33   Dg  Chest 2 View  Result Date: 08/01/2016 CLINICAL DATA:  Shortness of breath and cough for the past week following a flu syndrome. History of hypertension, smoker. EXAM: CHEST  2 VIEW COMPARISON:  None in PACs FINDINGS: The lungs are hyperinflated. The interstitial markings are coarse. There is confluent density peripherally in the left upper lobe measuring approximately 1.5 x 2 cm. The heart and pulmonary vascularity are normal. The mediastinum is normal in width. There is calcification in the wall of the tortuous thoracic aorta. There is mild multilevel degenerative disc disease of the thoracic spine. There is mild widening of the right AC joint. IMPRESSION: Ovoid soft tissue masslike density in the left upper lobe. The appearance is atypical for pneumonia. Chest CT scanning is recommended. Chronic bronchitic changes. Thoracic aortic atherosclerosis. These results will be called to the ordering clinician or representative by the Radiologist  Assistant, and communication documented in the PACS or zVision Dashboard. Electronically Signed   By: David  Martinique M.D.   On: 08/01/2016 08:57   Ct Chest W Contrast  Result Date: 08/01/2016 CLINICAL DATA:  Acute onset of shortness of breath. Pulmonary nodule noted on chest radiograph. Further evaluation requested. Initial encounter. EXAM: CT CHEST WITH CONTRAST TECHNIQUE: Multidetector CT imaging of the chest was performed during intravenous contrast administration. CONTRAST:  28mL ISOVUE-300 IOPAMIDOL (ISOVUE-300) INJECTION 61% COMPARISON:  Chest radiograph performed earlier today at 8:32 a.m. FINDINGS: Cardiovascular: The heart is normal in size. Scattered calcification is seen along the aortic arch and descending thoracic aorta. The great vessels are grossly unremarkable in appearance. Mediastinum/Nodes: Scattered mediastinal nodes remain normal in size. No pericardial effusion is identified. A 1.3 cm hypodensity at the right thyroid lobe is likely benign, given its  size. No axillary lymphadenopathy is seen. Postoperative change is noted at the left breast. A partially calcified small nodule is seen at the medial right breast. Lungs/Pleura: There is a 1.8 x 1.1 x 1.3 cm mildly lobulated nodule at the periphery of the left upper lobe, raising concern for malignancy. Mild nonspecific hazy opacities are seen within the left upper lobe, raising concern for a mild infectious process. Bronchiectasis is seen at the right middle lobe. No pleural effusion or pneumothorax is seen. Upper Abdomen: Small hypodensities are noted within the liver, measuring up to 1.7 cm in size. These are nonspecific but more likely reflect cysts. The visualized portions of the spleen, adrenal glands and kidneys are within normal limits. Musculoskeletal: No acute osseous abnormalities are identified. The visualized musculature is unremarkable in appearance. IMPRESSION: 1. 1.8 x 1.1 x 1.3 cm mildly lobulated nodule at the periphery of the left upper lung lobe. Malignancy cannot be excluded. Recommend Pulmonary consultation for tissue diagnosis, as deemed clinically appropriate. 2. Mild nonspecific hazy opacities at the left upper lobe, raising concern for a mild infectious process. 3. Bronchiectasis at the right middle lung lobe. 4. Small nonspecific hypodensities within the liver most likely reflect cysts. Electronically Signed   By: Garald Balding M.D.   On: 08/01/2016 18:31    Microbiology: No results found for this or any previous visit (from the past 240 hour(s)).   Labs: CBC:  Recent Labs Lab 08/04/16 2054 08/06/16 0503  WBC 7.7 15.9*  HGB 15.1* 13.4  HCT 44.1 39.1  MCV 90.0 90.1  PLT [id number]   Basic Metabolic Panel:  Recent Labs Lab 08/01/16 1806 08/04/16 2054  NA  --  139  K  --  3.6  CL  --  101  CO2  --  25  GLUCOSE  --  96  BUN  --  18  CREATININE 0.60 0.83  CALCIUM  --  9.9   Liver Function Tests:  Recent Labs Lab 08/05/16 0306  AST 19  ALT 26  ALKPHOS 76    BILITOT 0.8  PROT 7.4  ALBUMIN 4.0    Recent Labs Lab 08/05/16 0306  LIPASE 21   No results for input(s): AMMONIA in the last 168 hours. Cardiac Enzymes:  Recent Labs Lab 08/05/16 0952  TROPONINI <0.03   BNP (last 3 results) No results for input(s): BNP in the last 8760 hours. CBG: No results for input(s): GLUCAP in the last 168 hours. Time spent: 30 minutes  Signed:  Berle Mull  [facility] 08/06/2016 , 5:21 PM

## 2016-08-10 ENCOUNTER — Encounter: Payer: Self-pay | Admitting: Internal Medicine

## 2016-08-10 ENCOUNTER — Ambulatory Visit (INDEPENDENT_AMBULATORY_CARE_PROVIDER_SITE_OTHER): Payer: Medicare Other | Admitting: Internal Medicine

## 2016-08-10 ENCOUNTER — Encounter: Payer: Self-pay | Admitting: *Deleted

## 2016-08-10 VITALS — BP 142/84 | HR 67 | Ht 63.0 in | Wt 159.6 lb

## 2016-08-10 DIAGNOSIS — J449 Chronic obstructive pulmonary disease, unspecified: Secondary | ICD-10-CM

## 2016-08-10 DIAGNOSIS — R911 Solitary pulmonary nodule: Secondary | ICD-10-CM

## 2016-08-10 MED ORDER — TIOTROPIUM BROMIDE MONOHYDRATE 18 MCG IN CAPS: 18.0000 ug | ORAL_CAPSULE | Freq: Every day | RESPIRATORY_TRACT | 0 refills | Status: DC

## 2016-08-10 NOTE — Patient Instructions (Addendum)
Plan A = Automatic = spiriva each am   Plan B = Backup Only use your albuterol as a rescue medication to be used if you can't catch your breath by resting or doing a relaxed purse lip breathing pattern.  - The less you use it, the better it will work when you need it. - Ok to use the inhaler up to 2 puffs  every 4 hours if you must but call for appointment if use goes up over your usual need - Don't leave home without it !!  (think of it like the spare tire for your car)   Congratulations on not smoking, it's the most important aspect of your care      Please schedule a follow up office visit in 6 weeks, call sooner if needed with cxr on return and if at all possible obtain the actual chest xrays from [facility] in hand

## 2016-08-10 NOTE — Assessment & Plan Note (Addendum)
Quit smoking 07/20/16 - Spirometry 08/10/2016  FEV1 0.90 (39%)  Ratio 54 with classic curvature on spiriva handhaler   She has much more severe airflow obst than hx or exam would suggest    I reviewed the Fletcher curve with the patient that basically indicates  if you quit smoking when your best day FEV1 is still  preserved (as is only relatively still   the case here)  it is highly unlikely you will progress to severe disease and informed the patient there was  no medication on the market that has proven to alter the curve/ its downward trajectory  or the likelihood of progression of their disease(unlike other chronic medical conditions such as atheroclerosis where we do think we can change the natural hx with risk reducing meds)    Therefore stopping smoking and maintaining abstinence is the most important aspect of care, not choice of inhalers or for that matter, doctors.    For now leave on spiriva >  Consider stiolto next ov

## 2016-08-10 NOTE — Progress Notes (Signed)
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 Initial evaluation reveals acute respiratory failure  Information is obtained from the patient. She states that 2 weeks ago she developed worsening shortness of breath fever chills headache. She saw a primary care provider tested positive for influenza was treated with Tamiflu for 10 days and completed a Z-Pak. She reports the "flulike symptoms" improved but the shortness of breath persisted and worsened. She was given Levaquin and is currently taking Levaquin. She denies chest pain palpitations cough fever chills headache syncope or near-syncope. She denies lower extremity edema but does endorse some orthopnea. She continues to smoke and has not been officially diagnosed with COPD but reports "chronic bronchitis". She has an inhaler that she uses on occasion but here  lately it has not helped. She also reportsabdominal pain non-radiating in epigastric area relieved with belching. sheendorses some nausea without vomiting. She denies dysuria hematuria frequency or urgency. She denies diarrhea constipation melena or bright red blood per rectum. He states she normally has a slight wheeze but over the last 2 days this has diminished."  Hospital Course:   Summary of her active problems in the hospital is as following. 1. Acute respiratory failure with hypoxia likely related to recent community-acquired pneumonia/influenza in the setting of chronic bronchitis in a tobacco user. Oxygen saturation level dropped to 70s in ER with ambulation on room air. Chest x-ray with no active cardiopulmonary disease but with nodule that appears stable.  She is afebrile no leukocytosis and dynamically stable and not hypoxic.  D-dimer when adjusted for age within limits of normal. We will continue regimen initiated with inhalers, steroids and antibiotics.  2. Pulmonary nodule. Discussed with pulmonary, patient will follow-up with outpatient for further workup. May be amenable to CT-guided biopsy.  3. Tachycardia. Sinus Due to inhalers. Monitor.  4., Abdominal pain/nausea. Resolved.  5. Tobacco use. -Cessation counseling offered     08/10/2016 1st [facility] office visit/ Erin Guerrero   Chief Complaint  Patient presents with  . Pulmonary Consult    Referred by [facility] for eval of pulmonary nodule. Pt states she had the flu, and then was thought to have PNA and this is why CXR was done and then CT Chest. She states her breathing is at baseline now and no co's  today.     Completely back to nl but now on spiriva and prn saba  Doe = MMRC1 = can walk nl pace, flat grade, can't hurry or go uphills or steps s sob   No obvious day to day or daytime variability or assoc excess/ purulent sputum or mucus plugs or hemoptysis or cp or chest tightness, subjective wheeze  or overt sinus or hb symptoms. No unusual exp hx or h/o childhood pna/ asthma or knowledge of premature birth.  Sleeping ok without nocturnal  or early am exacerbation  of respiratory  c/o's or need for noct saba. Also denies any obvious fluctuation of symptoms with weather or environmental changes or other aggravating or alleviating factors except as outlined above   Current Medications, Allergies, Complete Past Medical History, Past Surgical History, Family History, and Social History were reviewed in [employer] record.  ROS  The following are not active complaints unless bolded sore throat, dysphagia, dental problems, itching, sneezing,  nasal congestion or excess/ purulent secretions, ear ache,   fever, chills, sweats, unintended wt loss, classically pleuritic or exertional cp,  orthopnea pnd or leg swelling, presyncope, palpitations, abdominal pain, anorexia, nausea, vomiting, diarrhea  or change in bowel or bladder habits, change in stools or urine, dysuria,hematuria,  rash, arthralgias, visual complaints, headache, numbness, weakness or ataxia or problems with walking or coordination,  change in mood/affect or memory.          Review of Systems     Objective:   Physical Exam amb wf nad  Wt Readings from Last 3 Encounters:  08/10/16 159 lb 9.6 oz (72.4 kg)  08/05/16 155 lb 12.8 oz (70.7 kg)  03/16/16 170 lb (77.1 kg)    Vital signs reviewed   - Note on arrival 02 sats  96% on RA      HEENT: nl dentition, turbinates bilaterally, and oropharynx. Nl external ear canals without cough reflex   NECK :  without JVD/Nodes/TM/ nl carotid upstrokes bilaterally   LUNGS: no acc muscle use,  sltly barrel contour chest/ diminished bs bilaterally but no wheezing   CV:  RRR  no s3 or murmur or increase in P2, and no edema   ABD:  soft and nontender with nl inspiratory excursion in the supine position. No bruits or organomegaly appreciated, bowel sounds nl  MS:   Nl gait/ ext warm without deformities, calf tenderness, cyanosis or clubbing No obvious joint restrictions   SKIN: warm and dry without lesions    NEURO:  alert, approp, nl sensorium with  no motor or cerebellar deficits apparent.     I personally reviewed images and agree with radiology impression as follows:  CT w contrast Chest  2/131/8  1. 1.8 x 1.1 x 1.3 cm mildly lobulated nodule at the periphery of the left upper lung lobe. Malignancy cannot be excluded. Recommend Pulmonary consultation for tissue diagnosis, as deemed clinically appropriate. 2. Mild nonspecific hazy opacities at the left upper lobe, raising concern for a mild infectious process. 3. Bronchiectasis at the right middle lung lobe.     Assessment:

## 2016-08-11 DIAGNOSIS — R911 Solitary pulmonary nodule: Secondary | ICD-10-CM | POA: Insufficient documentation

## 2016-08-11 NOTE — Assessment & Plan Note (Addendum)
See CT chest 08/01/16 with prior h/o pna in [place] 3 y prior > studies requested   She is clearly not a candidate for resection based on present FEV1 of only 0.90 and this  lesion looks quite dense to me so her  it's certainly possible it's just scar from her previous pna but since she's just getting over CAP there's nothing further to do for now as PET is the next step and should be postponed for about 6 weeks p lung infection to reduce false positives from inflammatory processes.  In meantime I've asked her to acquire her images from [place]  Discussed in detail all the  indications, usual  risks and alternatives  relative to the benefits with patient who agrees to proceed with conservative f/u as outlined    Total time devoted to counseling  > 50 % of initial 60 min office visit:  review case with pt/ discussion of options/alternatives/ personally creating written customized instructions  in presence of pt  then going over those specific  Instructions directly with the pt including how to use all of the meds but in particular covering each new medication in detail and the difference between the maintenance= "automatic" meds and the prns using an action plan format for the latter (If this problem/symptom => do that organization reading Left to right).  Please see AVS from this visit for a full list of these instructions which I personally wrote for this pt and  are unique to this visit.

## 2016-09-03 ENCOUNTER — Encounter (HOSPITAL_COMMUNITY): Payer: Self-pay | Admitting: Emergency Medicine

## 2016-09-03 ENCOUNTER — Emergency Department (HOSPITAL_COMMUNITY): Payer: Medicare Other

## 2016-09-03 ENCOUNTER — Emergency Department (HOSPITAL_COMMUNITY)
Admission: EM | Admit: 2016-09-03 | Discharge: 2016-09-03 | Disposition: A | Discharge status: Home / Self Care | Payer: Medicare Other | Attending: Emergency Medicine | Admitting: Emergency Medicine

## 2016-09-03 DIAGNOSIS — J449 Chronic obstructive pulmonary disease, unspecified: Secondary | ICD-10-CM | POA: Insufficient documentation

## 2016-09-03 DIAGNOSIS — M5441 Lumbago with sciatica, right side: Secondary | ICD-10-CM | POA: Insufficient documentation

## 2016-09-03 DIAGNOSIS — Z79899 Other long term (current) drug therapy: Secondary | ICD-10-CM | POA: Insufficient documentation

## 2016-09-03 DIAGNOSIS — I10 Essential (primary) hypertension: Secondary | ICD-10-CM | POA: Diagnosis not present

## 2016-09-03 DIAGNOSIS — Z7982 Long term (current) use of aspirin: Secondary | ICD-10-CM | POA: Diagnosis not present

## 2016-09-03 DIAGNOSIS — M5431 Sciatica, right side: Secondary | ICD-10-CM

## 2016-09-03 DIAGNOSIS — Z791 Long term (current) use of non-steroidal anti-inflammatories (NSAID): Secondary | ICD-10-CM | POA: Diagnosis not present

## 2016-09-03 DIAGNOSIS — Z87891 Personal history of nicotine dependence: Secondary | ICD-10-CM | POA: Diagnosis not present

## 2016-09-03 DIAGNOSIS — M545 Low back pain: Secondary | ICD-10-CM | POA: Diagnosis not present

## 2016-09-03 MED ORDER — HYDROMORPHONE HCL 1 MG/ML IJ SOLN
1.0000 mg | Freq: Once | INTRAMUSCULAR | Status: AC
  Administered 2016-09-03: 1 mg via INTRAVENOUS
  Filled 2016-09-03: qty 1

## 2016-09-03 MED ORDER — KETOROLAC TROMETHAMINE 30 MG/ML IJ SOLN
30.0000 mg | Freq: Once | INTRAMUSCULAR | Status: AC
  Administered 2016-09-03: 30 mg via INTRAVENOUS
  Filled 2016-09-03: qty 1

## 2016-09-03 MED ORDER — DIAZEPAM 5 MG PO TABS
5.0000 mg | ORAL_TABLET | Freq: Once | ORAL | Status: AC
  Administered 2016-09-03: 5 mg via ORAL
  Filled 2016-09-03: qty 1

## 2016-09-03 MED ORDER — DIAZEPAM 5 MG PO TABS: ORAL_TABLET | ORAL | 0 refills | Status: DC

## 2016-09-03 MED ORDER — ONDANSETRON HCL 4 MG/2ML IJ SOLN
4.0000 mg | Freq: Once | INTRAMUSCULAR | Status: AC
  Administered 2016-09-03: 4 mg via INTRAVENOUS
  Filled 2016-09-03: qty 2

## 2016-09-03 MED ORDER — PREDNISONE 10 MG PO TABS: 20.0000 mg | ORAL_TABLET | Freq: Every day | ORAL | 0 refills | Status: DC

## 2016-09-03 MED ORDER — METHYLPREDNISOLONE SODIUM SUCC 125 MG IJ SOLR
125.0000 mg | Freq: Once | INTRAMUSCULAR | Status: AC
  Administered 2016-09-03: 125 mg via INTRAVENOUS
  Filled 2016-09-03: qty 2

## 2016-09-03 MED ORDER — OXYCODONE-ACETAMINOPHEN 5-325 MG PO TABS: ORAL_TABLET | ORAL | 0 refills | Status: DC

## 2016-09-03 NOTE — ED Provider Notes (Signed)
Erin Guerrero DEPT Provider Note   CSN: [id number] Arrival date & time: 09/03/16  1448  By signing my name below, I, Erin Guerrero, attest that this documentation has been prepared under the direction and in the presence of Milton Ferguson, MD. Electronically Signed: Jeanell Guerrero, Scribe. 09/03/2016. 11:00 AM.  History   Chief Complaint Chief Complaint  Patient presents with  . Hip Pain   The history is provided by the patient. No language interpreter was used.  Leg Pain   This is a new problem. The current episode started more than 2 days ago. The problem occurs constantly. The problem has not changed since onset.The pain is present in the right lower leg. The quality of the pain is described as dull. The pain is at a severity of 7/10. The pain is moderate.   HPI Comments: Erin Guerrero is a 66 y.o. female who presents to the Emergency Department complaining of constant moderate right hip pain that started 2 days ago. She has been going to physical therapy for back pain prior to 2 days ago. She had back surgery at [facility] ~30 years ago. She has been taking ibuprofen PTA with minimal relief. She describes the pain as radiating to her right thigh. She denies any prior hx of back radiating to LE or other complaints.     PCP: Glo Herring., MD  Past Medical History:  Diagnosis Date  . Acute respiratory failure ([place])   . CAP (community acquired pneumonia)   . Chronic bronchitis ([place])   . HTN (hypertension)   . Influenza   . Osteoarthritis   . Pulmonary nodule   . Seasonal allergies   . Tobacco use     Patient Active Problem List   Diagnosis Date Noted  . Solitary pulmonary nodule 08/11/2016  . COPD  GOLD III  08/10/2016  . Tachycardia 08/05/2016  . Abdominal pain 08/05/2016  . HTN (hypertension)   . Acute respiratory failure ([place])   . Influenza   . CAP (community acquired pneumonia)   . Tobacco use   . Chronic bronchitis ([place])   . History of colonic polyps 02/25/2016      Past Surgical History:  Procedure Laterality Date  . BREAST BIOPSY  2014   Benign  . Cervical cryo-treatment  1972  . CHOLECYSTECTOMY  1998  . COLONOSCOPY     Polyps  . COLONOSCOPY N/A 03/16/2016   Procedure: COLONOSCOPY;  Surgeon: Daneil Dolin, MD;  Location[facility];  Service: Endoscopy;  Laterality: N/A;  1:30 PM  . DILATION AND CURETTAGE OF UTERUS    . GANGLION CYST EXCISION  1970   Left wrist  . GANGLION CYST EXCISION     Right hand and left ring finger  . [place]  . MENISCECTOMY  2008  . POLYPECTOMY  03/16/2016   Procedure: POLYPECTOMY;  Surgeon: Daneil Dolin, MD;  Location: [facility];  Service: Endoscopy;;  colon  . ROTATOR CUFF REPAIR  2004   Right    OB History    Gravida Para Term Preterm AB Living   1 1 1     1    SAB TAB Ectopic Multiple Live Births                   Home Medications    Prior to Admission medications   Medication Sig Start Date End Date Taking? Authorizing Provider  albuterol (PROVENTIL HFA;VENTOLIN HFA) 108 (90 Base) MCG/ACT inhaler Inhale 1-2 puffs into the  lungs every 6 (six) hours as needed for wheezing or shortness of breath.    Historical Provider, MD  aspirin EC 81 MG tablet Take 81 mg by mouth daily.    Historical Provider, MD  Calcium-Vitamin D 600-200 MG-UNIT tablet Take 2 tablets by mouth daily.    Historical Provider, MD  cetirizine (ZYRTEC) 10 MG tablet Take 10 mg by mouth daily.    Historical Provider, MD  docusate sodium (COLACE) 100 MG capsule Take 1 capsule (100 mg total) by mouth 2 (two) times daily as needed for mild constipation. 08/06/16   Lavina Hamman, MD  guaiFENesin (MUCINEX) 600 MG 12 hr tablet Take 1 tablet (600 mg total) by mouth 2 (two) times daily. 08/06/16   Lavina Hamman, MD  irbesartan (AVAPRO) 150 MG tablet Take 1 tablet by mouth daily. 10/11/12   Historical Provider, MD  levofloxacin (LEVAQUIN) 500 MG tablet Take 500 mg by mouth daily.    Historical Provider, MD  omega-3  acid ethyl esters (LOVAZA) 1 g capsule Take 3 g by mouth daily.    Historical Provider, MD  predniSONE (DELTASONE) 10 MG tablet Take 40mg  daily for 3days,Take 30mg  daily for 3days,Take 20mg  daily for 3days,Take 10mg  daily for 3days, then stop. 08/06/16   Lavina Hamman, MD  tiotropium (SPIRIVA HANDIHALER) 18 MCG inhalation capsule Place 1 capsule (18 mcg total) into inhaler and inhale daily. 08/10/16   Tanda Rockers, MD  triamcinolone (NASACORT) 55 MCG/ACT AERO nasal inhaler Place 2 sprays into the nose daily as needed (allergies).     Historical Provider, MD    Family History Family History  Problem Relation Age of Onset  . Cancer Mother 85    Question cervical/ovarian  . Kidney failure Sister 52    born with one kidney, other kidney injured by mva  . Kidney failure Sister   . Lupus Sister   . Diabetes Sister   . Thyroid cancer Daughter   . Celiac disease Neg Hx   . Inflammatory bowel disease Neg Hx     Social History Social History  Substance Use Topics  . Smoking status: Former Smoker    Packs/day: 1.00    Years: 40.00    Types: Cigarettes    Quit date: 07/20/2016  . Smokeless tobacco: Never Used  . Alcohol use No     Allergies   Diphenhydramine hcl; Morphine; Other; Statins; Benzocaine; Latex; Sulfa antibiotics; and Triclosan   Review of Systems Review of Systems  Constitutional: Negative for appetite change and fatigue.  HENT: Negative for congestion, ear discharge and sinus pressure.   Eyes: Negative for discharge.  Respiratory: Negative for cough.   Cardiovascular: Negative for chest pain.  Gastrointestinal: Negative for abdominal pain and diarrhea.  Genitourinary: Negative for frequency and hematuria.  Musculoskeletal: Positive for myalgias (Right hip/thigh). Negative for back pain.  Skin: Negative for rash.  Neurological: Negative for seizures and headaches.  Psychiatric/Behavioral: Negative for hallucinations.     Physical Exam Updated Vital Signs BP (!)  159/82 (BP Location: Right Arm)   Pulse 76   Temp 97.4 F (36.3 C) (Oral)   Resp 18   Ht 5' 3.5" (1.613 m)   Wt 165 lb (74.8 kg)   SpO2 98%   BMI 28.77 kg/m   Physical Exam  Constitutional: She is oriented to person, place, and time. She appears well-developed.  HENT:  Head: Normocephalic.  Eyes: Conjunctivae and EOM are normal. No scleral icterus.  Neck: Neck supple. No thyromegaly present.  Cardiovascular: Normal rate and regular rhythm.  Exam reveals no gallop and no friction rub.   No murmur heard. Pulmonary/Chest: No stridor. She has no wheezes. She has no rales. She exhibits no tenderness.  Abdominal: She exhibits no distension. There is no tenderness. There is no rebound.  Musculoskeletal: Normal range of motion. She exhibits no edema.  Moderate lumbar spine tenderness with positive straight leg raise on the right.   Lymphadenopathy:    She has no cervical adenopathy.  Neurological: She is oriented to person, place, and time. She exhibits normal muscle tone. Coordination normal.  Skin: No rash noted. No erythema.  Psychiatric: She has a normal mood and affect. Her behavior is normal.     ED Treatments / Results  DIAGNOSTIC STUDIES: Oxygen Saturation is 98% on RA, normal by my interpretation.    COORDINATION OF CARE: 11:04 AM- Pt advised of plan for treatment and pt agrees.  Labs (all labs ordered are listed, but only abnormal results are displayed) Labs Reviewed - No data to display  EKG  EKG Interpretation None       Radiology No results found.  Procedures Procedures (including critical care time)  Medications Ordered in ED Medications  HYDROmorphone (DILAUDID) injection 1 mg (not administered)  ondansetron (ZOFRAN) injection 4 mg (not administered)  methylPREDNISolone sodium succinate (SOLU-MEDROL) 125 mg/2 mL injection 125 mg (not administered)     Initial Impression / Assessment and Plan / ED Course  I have reviewed the triage vital signs and  the nursing notes.  Pertinent labs & imaging results that were available during my care of the patient were reviewed by me and considered in my medical decision making (see chart for details).     Patient with sciatica on the right side. Patient put on prednisone and Percocet and Valium will follow-up with her PCP Final Clinical Impressions(s) / ED Diagnoses   Final diagnoses:  None    New Prescriptions New Prescriptions   No medications on file   The chart was scribed for me under my direct supervision.  I personally performed the history, physical, and medical decision making and all procedures in the evaluation of this patient.Milton Ferguson, MD 09/03/16 [phone]

## 2016-09-03 NOTE — Discharge Instructions (Signed)
Rest at home for a couple days and follow-up with her doctor this week for recheck

## 2016-09-03 NOTE — ED Triage Notes (Signed)
Patient c/o right hip pain that radiates into thigh. Per patient started Friday. Denies any injury or swelling. Patient taking ibuprofen for pain without relief.

## 2016-09-09 ENCOUNTER — Other Ambulatory Visit: Payer: Self-pay | Admitting: Internal Medicine

## 2016-09-21 ENCOUNTER — Encounter: Payer: Self-pay | Admitting: Family Medicine

## 2016-09-21 ENCOUNTER — Ambulatory Visit (INDEPENDENT_AMBULATORY_CARE_PROVIDER_SITE_OTHER): Payer: Medicare Other | Admitting: Family Medicine

## 2016-09-21 VITALS — BP 120/68 | HR 73 | Temp 96.8°F | Ht 63.5 in | Wt 169.0 lb

## 2016-09-21 DIAGNOSIS — I10 Essential (primary) hypertension: Secondary | ICD-10-CM | POA: Diagnosis not present

## 2016-09-21 DIAGNOSIS — Z720 Tobacco use: Secondary | ICD-10-CM | POA: Diagnosis not present

## 2016-09-21 DIAGNOSIS — Z716 Tobacco abuse counseling: Secondary | ICD-10-CM

## 2016-09-21 MED ORDER — VARENICLINE TARTRATE 0.5 MG X 11 & 1 MG X 42 PO MISC: ORAL | 0 refills | Status: DC

## 2016-09-21 MED ORDER — VARENICLINE TARTRATE 1 MG PO TABS: 1.0000 mg | ORAL_TABLET | Freq: Two times a day (BID) | ORAL | 1 refills | Status: DC

## 2016-09-24 NOTE — Progress Notes (Signed)
BP 120/68   Pulse 73   Temp (!) 96.8 F (36 C) (Oral)   Ht 5' 3.5" (1.613 m)   Wt 169 lb (76.7 kg)   BMI 29.47 kg/m    Subjective:    Patient ID: Erin Guerrero, female    DOB: 25-Jul-1950, 66 y.o.   MRN: [id number]  HPI: Erin Guerrero is a 66 y.o. female presenting on 09/21/2016 for Establish Care   HPI Hypertension Patient is coming in for a hypertension visit and to establish care with our office. Her blood pressure today is 120/68. She has been on Avapro. Patient denies headaches, blurred vision, chest pains, shortness of breath, or weakness. Denies any side effects from medication and is content with current medication. Patient is also a smoker and wants to try to quit smoking. She has tried over-the-counter nicotine replacement but would like to try Chantix to see how that does for her. She would like to quit because she has realized that it has started to affect her breathing and has started to develop some amount of COPD.  Relevant past medical, surgical, family and social history reviewed and updated as indicated. Interim medical history since our last visit reviewed. Allergies and medications reviewed and updated.  Review of Systems  Constitutional: Negative for chills and fever.  Respiratory: Negative for chest tightness and shortness of breath.   Cardiovascular: Negative for chest pain and leg swelling.  Musculoskeletal: Negative for back pain and gait problem.  Skin: Negative for rash.  Neurological: Negative for dizziness, weakness, light-headedness, numbness and headaches.  Psychiatric/Behavioral: Negative for agitation and behavioral problems.  All other systems reviewed and are negative.   Per HPI unless specifically indicated above  Social History   Social History  . Marital status: Widowed    Spouse name: N/A  . Number of children: N/A  . Years of education: N/A   Occupational History  . PT for [employer], OR/ER nurse    Social History Main Topics  .  Smoking status: Current Every Day Smoker    Packs/day: 1.00    Years: 50.00    Types: Cigarettes    Last attempt to quit: 07/20/2016  . Smokeless tobacco: Never Used  . Alcohol use No  . Drug use: No  . Sexual activity: No   Other Topics Concern  . Not on file   Social History Narrative  . No narrative on file    Past Surgical History:  Procedure Laterality Date  . BREAST BIOPSY  2014   Benign  . Cervical cryo-treatment  1972  . CHOLECYSTECTOMY  1998  . COLONOSCOPY     Polyps  . COLONOSCOPY N/A 03/16/2016   Procedure: COLONOSCOPY;  Surgeon: Daneil Dolin, MD;  [facility];  Service: Endoscopy;  Laterality: N/A;  1:30 PM  . DILATION AND CURETTAGE OF UTERUS    . GANGLION CYST EXCISION  1970   Left wrist  . GANGLION CYST EXCISION     Right hand and left ring finger  . [place]  . MENISCECTOMY  2008  . POLYPECTOMY  03/16/2016   Procedure: POLYPECTOMY;  Surgeon: Daneil Dolin, MD;  [facility];  Service: Endoscopy;;  colon  . ROTATOR CUFF REPAIR  2004   Right  . TUBAL LIGATION      Family History  Problem Relation Age of Onset  . Cancer Mother 27    Question cervical/ovarian  . Arthritis Mother   . Kidney failure  Sister 47    born with one kidney, other kidney injured by mva  . Kidney failure Sister   . Lupus Sister   . Diabetes Sister   . Thyroid cancer Daughter   . Arthritis Brother   . Hearing loss Brother   . Scoliosis Brother   . Celiac disease Neg Hx   . Inflammatory bowel disease Neg Hx          Objective:    BP 120/68   Pulse 73   Temp (!) 96.8 F (36 C) (Oral)   Ht 5' 3.5" (1.613 m)   Wt 169 lb (76.7 kg)   BMI 29.47 kg/m   Wt Readings from Last 3 Encounters:  09/21/16 169 lb (76.7 kg)  09/03/16 165 lb (74.8 kg)  08/10/16 159 lb 9.6 oz (72.4 kg)    Physical Exam  Constitutional: She is oriented to person, place, and time. She appears well-developed and well-nourished. No distress.  Eyes:  Conjunctivae are normal.  Neck: Neck supple. No thyromegaly present.  Cardiovascular: Normal rate, regular rhythm, normal heart sounds and intact distal pulses.   No murmur heard. Pulmonary/Chest: Effort normal and breath sounds normal. No respiratory distress. She has no wheezes.  Abdominal: Soft. Bowel sounds are normal. She exhibits no distension. There is no tenderness. There is no rebound.  Musculoskeletal: Normal range of motion. She exhibits no edema or tenderness.  Lymphadenopathy:    She has no cervical adenopathy.  Neurological: She is alert and oriented to person, place, and time. Coordination normal.  Skin: Skin is warm and dry. No rash noted. She is not diaphoretic.  Psychiatric: She has a normal mood and affect. Her behavior is normal.  Nursing note and vitals reviewed.     Assessment & Plan:   Problem List Items Addressed This Visit      Cardiovascular and Mediastinum   HTN (hypertension)    Other Visit Diagnoses    Encounter for smoking cessation counseling    -  Primary   Relevant Medications   varenicline (CHANTIX STARTING MONTH PAK) 0.5 MG X 11 & 1 MG X 42 tablet   varenicline (CHANTIX CONTINUING MONTH PAK) 1 MG tablet       Follow up plan: Return in about 3 months (around 12/21/2016), or if symptoms worsen or fail to improve, for fasting labs.  Caryl Pina, MD [facility] 09/21/2016, 4:54 PM

## 2016-09-25 ENCOUNTER — Ambulatory Visit: Payer: Medicare Other | Admitting: Internal Medicine

## 2016-09-25 ENCOUNTER — Ambulatory Visit (INDEPENDENT_AMBULATORY_CARE_PROVIDER_SITE_OTHER): Payer: Medicare Other | Admitting: Internal Medicine

## 2016-09-25 ENCOUNTER — Encounter: Payer: Self-pay | Admitting: Internal Medicine

## 2016-09-25 VITALS — BP 150/80 | HR 74 | Ht 68.0 in | Wt 168.0 lb

## 2016-09-25 DIAGNOSIS — F17210 Nicotine dependence, cigarettes, uncomplicated: Secondary | ICD-10-CM | POA: Diagnosis not present

## 2016-09-25 DIAGNOSIS — R911 Solitary pulmonary nodule: Secondary | ICD-10-CM | POA: Diagnosis not present

## 2016-09-25 DIAGNOSIS — J449 Chronic obstructive pulmonary disease, unspecified: Secondary | ICD-10-CM | POA: Diagnosis not present

## 2016-09-25 MED ORDER — TIOTROPIUM BROMIDE MONOHYDRATE 2.5 MCG/ACT IN AERS: 2.0000 | INHALATION_SPRAY | Freq: Every day | RESPIRATORY_TRACT | 0 refills | Status: DC

## 2016-09-25 NOTE — Assessment & Plan Note (Signed)
See CT chest 08/01/16 with prior h/o pna in [facility] 01/18/14 large as dz in same distribution LUL apicoposterior segment  - PET rec 09/25/2016 >>>  Pt very anxious to have this addressed now rather than f/u longitudinally but I strongly suspect it is nothing more than a scar from prev pna so if neg PET can defer bx, if pos then excisional bx best option  Discussed in detail all the  indications, usual  risks and alternatives  relative to the benefits with patient who agrees to proceed with w/u as outlined .

## 2016-09-25 NOTE — Assessment & Plan Note (Addendum)
Quit smoking 07/20/16 - Spirometry 08/10/2016  FEV1 0.90 (39%)  Ratio 54 with classic curvature on spiriva handhaler  - 09/25/2016  After extensive coaching HFA effectiveness =    75% SMI so try spiriva respimat   I had an extended discussion with the patient reviewing all relevant studies completed to date and  lasting 15 to 20 minutes of a 25 minute visit    Each maintenance medication was reviewed in detail including most importantly the difference between maintenance and prns and under what circumstances the prns are to be triggered using an action plan format that is not reflected in the computer generated alphabetically organized AVS.    Please see AVS for specific instructions unique to this visit that I personally wrote and verbalized to the the pt in detail and then reviewed with pt  by my nurse highlighting any  changes in therapy recommended at today's visit to their plan of care.

## 2016-09-25 NOTE — Patient Instructions (Signed)
Please see patient coordinator before you leave today  to schedule PET scan and I will call you with the results  In the meantime try to stop all smoking if possible   Start spiriva respimat 2.5  X  2 puffs each am  (one capsule of handihaler)

## 2016-09-25 NOTE — Assessment & Plan Note (Signed)
>   3 min  Resumed smoking despite acknowledged risks esp concern she may have lung ca > strongly rec commit to quit but would not at this point despite Risk/benefit review

## 2016-09-25 NOTE — Progress Notes (Signed)
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 Initial evaluation reveals acute respiratory failure  Information is obtained from the patient. She states that 2 weeks ago she developed worsening shortness of breath fever chills headache. She saw a primary care provider tested positive for influenza was treated with Tamiflu for 10 days and completed a Z-Pak. She reports the "flulike symptoms" improved but the shortness of breath persisted and worsened. She was given Levaquin and is currently taking Levaquin. She denies chest pain palpitations cough fever chills headache syncope or near-syncope. She denies lower extremity edema but does endorse some orthopnea. She continues to smoke and has not been officially diagnosed with COPD but reports "chronic bronchitis". She has an inhaler that she uses on  occasion but here lately it has not helped. She also reportsabdominal pain non-radiating in epigastric area relieved with belching. sheendorses some nausea without vomiting. She denies dysuria hematuria frequency or urgency. She denies diarrhea constipation melena or bright red blood per rectum. He states she normally has a slight wheeze but over the last 2 days this has diminished."  Hospital Course:   Summary of her active problems in the hospital is as following. 1. Acute respiratory failure with hypoxia likely related to recent community-acquired pneumonia/influenza in the setting of chronic bronchitis in a tobacco user. Oxygen saturation level dropped to 70s in ER with ambulation on room air. Chest x-ray with no active cardiopulmonary disease but with nodule that appears stable.  She is afebrile no leukocytosis and dynamically stable and not hypoxic.  D-dimer when adjusted for age within limits of normal. We will continue regimen initiated with inhalers, steroids and antibiotics.  2. Pulmonary nodule. Discussed with pulmonary, patient will follow-up with outpatient for further workup. May be amenable to CT-guided biopsy.  3. Tachycardia. Sinus Due to inhalers. Monitor.  4., Abdominal pain/nausea. Resolved.  5. Tobacco use. -Cessation counseling offered     08/10/2016 1st [facility] office visit/ Felton Buczynski  Re abn cxr  Chief Complaint  Patient presents with  . Pulmonary Consult    Referred by [facility] for eval of pulmonary nodule. Pt states she had the flu, and then was thought to have PNA and this is why CXR was done and then CT Chest. She states her  breathing is at baseline now and no co's today.   Completely back to nl but now on spiriva and prn saba  Doe = MMRC1 = can walk nl pace, flat grade, can't hurry or go uphills or steps s sob  rec Plan A = Automatic = spiriva each am  Plan B = Backup Only use your albuterol as a rescue medication  Congratulations  on not smoking, it's the most important aspect of your care    09/25/2016  f/u ov/Taylar Hartsough re:  GOLD III copd/ spiriva handhaler/ f/u SPN LUL  Chief Complaint  Patient presents with  . Follow-up    pt reports she is doing ok but started smoking a little bit, she is doing better no SOB, thinks Spiriva is helping  doe still MMRC =1   No obvious day to day or daytime variability or assoc excess/ purulent sputum or mucus plugs or hemoptysis or cp or chest tightness, subjective wheeze or overt sinus or hb symptoms. No unusual exp hx or h/o childhood pna/ asthma or knowledge of premature birth.  Sleeping ok without nocturnal  or early am exacerbation  of respiratory  c/o's or need for noct saba. Also denies any obvious fluctuation of symptoms with weather or environmental changes or other aggravating or alleviating factors except as outlined above   Current Medications, Allergies, Complete Past Medical History, Past Surgical History, Family History, and Social History were reviewed in [employer] record.  ROS  The following are not active complaints unless bolded sore throat, dysphagia, dental problems, itching, sneezing,  nasal congestion or excess/ purulent secretions, ear ache,   fever, chills, sweats, unintended wt loss, classically pleuritic or exertional cp,  orthopnea pnd or leg swelling, presyncope, palpitations, abdominal pain, anorexia, nausea, vomiting, diarrhea  or change in bowel or bladder habits, change in stools or urine, dysuria,hematuria,  rash, arthralgias, visual complaints, headache, numbness, weakness or ataxia or problems with walking or coordination,  change in mood/affect or memory.                    Objective:   Physical Exam amb wf nad   09/25/2016          168  08/10/16 159 lb 9.6 oz (72.4 kg)  08/05/16 155 lb 12.8 oz (70.7 kg)  03/16/16 170 lb (77.1 kg)    Vital signs reviewed   - Note on arrival 02 sats  100% on RA      HEENT: nl  dentition, turbinates bilaterally, and oropharynx. Nl external ear canals without cough reflex   NECK :  without JVD/Nodes/TM/ nl carotid upstrokes bilaterally   LUNGS: no acc muscle use,  sltly barrel contour chest/ diminished bs bilaterally but no wheezing at all    CV:  RRR  no s3 or murmur or increase in P2, and no edema   ABD:  soft and nontender with nl inspiratory excursion in the supine position. No bruits or organomegaly appreciated, bowel sounds nl  MS:  Nl gait/ ext warm without deformities, calf tenderness, cyanosis or clubbing No obvious joint restrictions   SKIN: warm and dry without lesions    NEURO:  alert, approp, nl sensorium with  no motor or cerebellar deficits apparent.     I personally reviewed images and agree with radiology impression as follows:  CT w contrast Chest  08/01/16  1. 1.8 x 1.1 x 1.3 cm mildly lobulated nodule at the periphery of the left upper lung lobe. Malignancy cannot be  excluded. Recommend Pulmonary consultation for tissue diagnosis, as deemed clinically appropriate. 2. Mild nonspecific hazy opacities at the left upper lobe, raising concern for a mild infectious process. 3. Bronchiectasis at the right middle lung lobe.     Assessment:

## 2016-10-04 ENCOUNTER — Encounter (HOSPITAL_COMMUNITY): Payer: Medicare Other

## 2016-10-05 ENCOUNTER — Ambulatory Visit (HOSPITAL_COMMUNITY)
Admission: RE | Admit: 2016-10-05 | Discharge: 2016-10-05 | Disposition: A | Discharge status: Home / Self Care | Payer: Medicare Other | Source: Ambulatory Visit | Attending: Internal Medicine | Admitting: Internal Medicine

## 2016-10-05 DIAGNOSIS — R911 Solitary pulmonary nodule: Secondary | ICD-10-CM

## 2016-10-05 DIAGNOSIS — R918 Other nonspecific abnormal finding of lung field: Secondary | ICD-10-CM | POA: Insufficient documentation

## 2016-10-05 DIAGNOSIS — D3501 Benign neoplasm of right adrenal gland: Secondary | ICD-10-CM | POA: Diagnosis not present

## 2016-10-05 LAB — GLUCOSE, CAPILLARY: GLUCOSE-CAPILLARY: 103 mg/dL — AB (ref 65–99)

## 2016-10-05 MED ORDER — FLUDEOXYGLUCOSE F - 18 (FDG) INJECTION
8.4500 | Freq: Once | INTRAVENOUS | Status: AC | PRN
  Administered 2016-10-05: 8.45 via INTRAVENOUS

## 2016-10-05 NOTE — Progress Notes (Signed)
Spoke with pt and notified of results per Dr. Wert. Pt verbalized understanding and denied any questions. 

## 2016-10-15 ENCOUNTER — Other Ambulatory Visit: Payer: Self-pay | Admitting: Family Medicine

## 2016-10-15 ENCOUNTER — Other Ambulatory Visit: Payer: Self-pay | Admitting: Internal Medicine

## 2016-10-27 ENCOUNTER — Other Ambulatory Visit: Payer: Self-pay | Admitting: Family Medicine

## 2016-10-27 DIAGNOSIS — Z716 Tobacco abuse counseling: Secondary | ICD-10-CM

## 2016-10-27 NOTE — Telephone Encounter (Signed)
lmtcb

## 2016-10-27 NOTE — Telephone Encounter (Signed)
Patient should not need a refill on the starting month pack, she should have Chantix continuing month pack which is 1 mg twice a day and 60 tablets. If she does not have this then go ahead and send one for her.

## 2016-12-28 ENCOUNTER — Ambulatory Visit (INDEPENDENT_AMBULATORY_CARE_PROVIDER_SITE_OTHER): Payer: Medicare Other

## 2016-12-28 ENCOUNTER — Encounter: Payer: Self-pay | Admitting: Family Medicine

## 2016-12-28 ENCOUNTER — Ambulatory Visit (INDEPENDENT_AMBULATORY_CARE_PROVIDER_SITE_OTHER): Payer: Medicare Other | Admitting: Family Medicine

## 2016-12-28 VITALS — BP 138/78 | HR 64 | Temp 97.2°F | Ht 68.0 in | Wt 175.0 lb

## 2016-12-28 DIAGNOSIS — I10 Essential (primary) hypertension: Secondary | ICD-10-CM | POA: Diagnosis not present

## 2016-12-28 DIAGNOSIS — Z780 Asymptomatic menopausal state: Secondary | ICD-10-CM

## 2016-12-28 DIAGNOSIS — E663 Overweight: Secondary | ICD-10-CM | POA: Diagnosis not present

## 2016-12-28 DIAGNOSIS — J449 Chronic obstructive pulmonary disease, unspecified: Secondary | ICD-10-CM

## 2016-12-28 NOTE — Progress Notes (Signed)
BP 138/78   Pulse 64   Temp (!) 97.2 F (36.2 C) (Oral)   Ht _0  (1.727 m)   Wt 175 lb (79.4 kg)   BMI 26.61 kg/m    Subjective:    Patient ID: Erin Guerrero, female    DOB: 01-01-1951, 66 y.o.   MRN: [id number]  HPI: Erin Guerrero is a 66 y.o. female presenting on 12/28/2016 for Follow-up (pt here today for routine follow up of her chronic medical conditions and blood work.)   HPI Hypertension Patient is currently on Avapro, and their blood pressure today is 138/78. Patient denies any lightheadedness or dizziness. Patient denies headaches, blurred vision, chest pains, shortness of breath, or weakness. Denies any side effects from medication and is content with current medication.   COPD recheck Patient is coming in today for a COPD recheck, she needs a refill on her Spiriva. She feels like is doing very well for her and she uses albuterol inhaler a couple times a week but not more. She is having slightly increased cough since she stopped smoking but has been feeling a lot better in general.  Patient is postmenopausal and needs a DEXA scan  Relevant past medical, surgical, family and social history reviewed and updated as indicated. Interim medical history since our last visit reviewed. Allergies and medications reviewed and updated.  Review of Systems  Constitutional: Negative for chills and fever.  HENT: Negative for congestion, ear discharge and ear pain.   Eyes: Negative for redness and visual disturbance.  Respiratory: Positive for cough and wheezing. Negative for chest tightness and shortness of breath.   Cardiovascular: Negative for chest pain and leg swelling.  Musculoskeletal: Negative for back pain and gait problem.  Skin: Negative for rash.  Neurological: Negative for dizziness, light-headedness and headaches.  Psychiatric/Behavioral: Negative for agitation and behavioral problems.  All other systems reviewed and are negative.   Per HPI unless  specifically indicated above     Objective:    BP 138/78   Pulse 64   Temp (!) 97.2 F (36.2 C) (Oral)   Ht _1  (1.727 m)   Wt 175 lb (79.4 kg)   BMI 26.61 kg/m   Wt Readings from Last 3 Encounters:  12/28/16 175 lb (79.4 kg)  09/25/16 168 lb (76.2 kg)  09/21/16 169 lb (76.7 kg)    Physical Exam  Constitutional: She is oriented to person, place, and time. She appears well-developed and well-nourished. No distress.  Eyes: Conjunctivae are normal.  Neck: Neck supple. No thyromegaly present.  Cardiovascular: Normal rate, regular rhythm, normal heart sounds and intact distal pulses.   No murmur heard. Pulmonary/Chest: Effort normal and breath sounds normal. No respiratory distress. She has no wheezes.  Musculoskeletal: Normal range of motion. She exhibits no edema or tenderness.  Neurological: She is alert and oriented to person, place, and time. Coordination normal.  Skin: Skin is warm and dry. No rash noted. She is not diaphoretic.  Psychiatric: She has a normal mood and affect. Her behavior is normal.  Nursing note and vitals reviewed.      Assessment & Plan:   Problem List Items Addressed This Visit      Cardiovascular and Mediastinum   HTN (hypertension) - Primary   Relevant Orders   CMP14+EGFR     Respiratory   COPD  GOLD III      Other   Overweight (BMI 25.0-29.9)   Relevant Orders   Lipid panel    Other Visit  Diagnoses    Postmenopausal       Relevant Orders   DG Bone Density       Follow up plan: Return in about 6 months (around 06/30/2017), or if symptoms worsen or fail to improve, for Recheck hypertension and COPD.  Counseling provided for all of the vaccine components Orders Placed This Encounter  Procedures  . DG Bone Density  . CMP14+EGFR  . Lipid panel    Caryl Pina, MD Colon Medicine 12/28/2016, 8:38 AM

## 2016-12-29 LAB — LIPID PANEL
CHOL/HDL RATIO: 3.5 ratio (ref 0.0–4.4)
CHOLESTEROL TOTAL: 236 mg/dL — AB (ref 100–199)
HDL: 67 mg/dL (ref >39)
LDL CALC: 149 mg/dL — AB (ref 0–99)
TRIGLYCERIDES: 102 mg/dL (ref 0–149)
VLDL Cholesterol Cal: 20 mg/dL (ref 5–40)

## 2016-12-29 LAB — CMP14+EGFR
A/G RATIO: 1.9 (ref 1.2–2.2)
ALBUMIN: 4.2 g/dL (ref 3.6–4.8)
ALK PHOS: 88 IU/L (ref 39–117)
ALT: 24 IU/L (ref 0–32)
AST: 16 IU/L (ref 0–40)
BUN / CREAT RATIO: 21 (ref 12–28)
BUN: 15 mg/dL (ref 8–27)
Bilirubin Total: 0.3 mg/dL (ref 0.0–1.2)
CO2: 22 mmol/L (ref 20–29)
CREATININE: 0.71 mg/dL (ref 0.57–1.00)
Calcium: 9.5 mg/dL (ref 8.7–10.3)
Chloride: 106 mmol/L (ref 96–106)
GFR calc Af Amer: 102 mL/min/{1.73_m2} (ref >59)
GFR, EST NON AFRICAN AMERICAN: 88 mL/min/{1.73_m2} (ref >59)
Globulin, Total: 2.2 g/dL (ref 1.5–4.5)
Glucose: 82 mg/dL (ref 65–99)
POTASSIUM: 4.4 mmol/L (ref 3.5–5.2)
Sodium: 144 mmol/L (ref 134–144)
Total Protein: 6.4 g/dL (ref 6.0–8.5)

## 2017-01-01 ENCOUNTER — Other Ambulatory Visit: Payer: Self-pay | Admitting: Family Medicine

## 2017-01-01 DIAGNOSIS — Z1231 Encounter for screening mammogram for malignant neoplasm of breast: Secondary | ICD-10-CM

## 2017-01-04 ENCOUNTER — Encounter: Payer: Self-pay | Admitting: Internal Medicine

## 2017-01-04 ENCOUNTER — Ambulatory Visit (INDEPENDENT_AMBULATORY_CARE_PROVIDER_SITE_OTHER): Payer: Medicare Other | Admitting: Internal Medicine

## 2017-01-04 ENCOUNTER — Ambulatory Visit (INDEPENDENT_AMBULATORY_CARE_PROVIDER_SITE_OTHER)
Admission: RE | Admit: 2017-01-04 | Discharge: 2017-01-04 | Disposition: A | Discharge status: Home / Self Care | Payer: Medicare Other | Source: Ambulatory Visit | Attending: Internal Medicine | Admitting: Internal Medicine

## 2017-01-04 VITALS — BP 126/60 | HR 74 | Ht 63.0 in | Wt 172.0 lb

## 2017-01-04 DIAGNOSIS — R911 Solitary pulmonary nodule: Secondary | ICD-10-CM

## 2017-01-04 DIAGNOSIS — R918 Other nonspecific abnormal finding of lung field: Secondary | ICD-10-CM | POA: Diagnosis not present

## 2017-01-04 DIAGNOSIS — J449 Chronic obstructive pulmonary disease, unspecified: Secondary | ICD-10-CM

## 2017-01-04 NOTE — Progress Notes (Signed)
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 Initial evaluation reveals acute respiratory failure  Information is obtained from the patient. She states that 67 weeks ago she developed worsening shortness of breath fever chills headache. She saw a primary care provider tested positive for influenza was treated with Tamiflu for 10 days and completed a Z-Pak. She reports the "flulike symptoms" improved but the shortness of breath persisted and worsened. She was given Levaquin and is currently taking Levaquin. She denies chest pain palpitations cough fever chills headache syncope or near-syncope. She denies lower extremity edema but does endorse some orthopnea. She continues to smoke and has not been officially diagnosed with COPD but reports "chronic bronchitis". She has an inhaler that she uses on  occasion but here lately it has not helped. She also reportsabdominal pain non-radiating in epigastric area relieved with belching. sheendorses some nausea without vomiting. She denies dysuria hematuria frequency or urgency. She denies diarrhea constipation melena or bright red blood per rectum. He states she normally has a slight wheeze but over the last 2 days this has diminished."  Hospital Course:   Summary of her active problems in the hospital is as following. 1. Acute respiratory failure with hypoxia likely related to recent community-acquired pneumonia/influenza in the setting of chronic bronchitis in a tobacco user. Oxygen saturation level dropped to 70s in ER with ambulation on room air. Chest x-ray with no active cardiopulmonary disease but with nodule that appears stable.  She is afebrile no leukocytosis and dynamically stable and not hypoxic.  D-dimer when adjusted for age within limits of normal. We will continue regimen initiated with inhalers, steroids and antibiotics.  2. Pulmonary nodule. Discussed with pulmonary, patient will follow-up with outpatient for further workup. May be amenable to CT-guided biopsy.  3. Tachycardia. Sinus Due to inhalers. Monitor.  4., Abdominal pain/nausea. Resolved.  5. Tobacco use. -Cessation counseling offered     08/10/2016 1st [place] [place] [facility] office visit/ Wert  Re abn cxr  Chief Complaint  Patient presents with  . Pulmonary Consult    Referred by [facility] for eval of pulmonary nodule. Pt states she had the flu, and then was thought to have PNA and this is why CXR was done and then CT Chest. She states her  breathing is at baseline now and no co's today.   Completely back to nl but now on spiriva and prn saba  Doe = MMRC1 = can walk nl pace, flat grade, can't hurry or go uphills or steps s sob  rec Plan A = Automatic = spiriva each am  Plan B = Backup Only use your albuterol as a rescue medication  Congratulations  on not smoking, it's the most important aspect of your care    09/25/2016  f/u ov/Wert re:  GOLD III copd/ spiriva handhaler/ f/u SPN LUL   Chief Complaint  Patient presents with  . Follow-up    pt reports she is doing ok but started smoking a little bit, she is doing better no SOB, thinks Spiriva is helping  doe still MMRC =1  rec Please see patient coordinator before you leave today  to schedule PET scan and I will call you with the results In the meantime try to stop all smoking if possible  Start spiriva respimat 2.5  X  2 puffs each am  (one capsule of handihaler)       01/04/2017  f/u ov/Wert re: last cig x 3 m/ maint rx spiriva daily / rare saba  Chief Complaint  Patient presents with  . Follow-up    Pt has been doing good since last visit; no complaints or concerns. Denies any SOB, cough, or CP.    Doe still MMRC1 = can walk nl pace, flat grade, can't hurry or go uphills or steps s sob improved on spiriva    No obvious day to day or daytime variability or assoc excess/ purulent sputum or mucus plugs or hemoptysis or cp or chest tightness, subjective wheeze or overt sinus or hb symptoms. No unusual exp hx or h/o childhood pna/ asthma or knowledge of premature birth.  Sleeping ok without nocturnal  or early am exacerbation  of respiratory  c/o's or need for noct saba. Also denies any obvious fluctuation of symptoms with weather or environmental changes or other aggravating or alleviating factors except as outlined above   Current Medications, Allergies, Complete Past Medical History, Past Surgical History, Family History, and Social History were reviewed in [employer] record.  ROS  The following are not active complaints unless bolded sore throat, dysphagia, dental problems, itching, sneezing,  nasal congestion or excess/ purulent secretions, ear ache,   fever, chills, sweats, unintended wt loss, classically pleuritic or exertional cp,  orthopnea pnd or leg  swelling, presyncope, palpitations, abdominal pain, anorexia, nausea, vomiting, diarrhea  or change in bowel or bladder habits, change in stools or urine, dysuria,hematuria,  rash, arthralgias, visual complaints, headache, numbness, weakness or ataxia or problems with walking or coordination,  change in mood/affect or memory.                    Objective:   Physical Exam amb wf nad   01/04/2017        172  09/25/2016          168  08/10/16 159 lb 9.6 oz (72.4 kg)  08/05/16 155 lb 12.8 oz (70.7 kg)  03/16/16 170 lb (77.1 kg)    Vital signs reviewed   - Note on arrival 02 sats  97% on RA      HEENT: nl dentition, turbinates bilaterally, and oropharynx. Nl external ear canals without cough reflex   NECK :  without JVD/Nodes/TM/ nl carotid upstrokes bilaterally   LUNGS: no acc muscle use,  sltly barrel contour chest/ diminished bs bilaterally but no wheeze   CV:  RRR  no s3 or murmur or increase in P2, and no edema   ABD:  soft and nontender with nl inspiratory excursion in the supine position. No bruits or organomegaly appreciated, bowel sounds nl  MS:  Nl gait/ ext warm without deformities, calf tenderness, cyanosis or clubbing No obvious joint restrictions   SKIN: warm and dry without lesions    NEURO:  alert, approp, nl sensorium with  no motor or cerebellar deficits apparent.    CXR PA and Lateral:   01/04/2017 :    I personally reviewed images and agree with radiology impression as follows:    No change by radiography of a 1 x 2 cm left upper lobe nodule. Other pulmonary markings appear stable. Smaller nodules visible by CT are not visible by radiography       Assessment:

## 2017-01-04 NOTE — Patient Instructions (Addendum)
Please schedule a follow up visit in 6  months but call sooner if needed  With cxr on return

## 2017-01-07 NOTE — Assessment & Plan Note (Signed)
See CT chest 08/01/16 with prior h/o pna in [facility] 01/18/14 large as dz in same distribution LUL apicoposterior segment  - PET rec 10/05/2016 > neg c/w scarring from pna same location   This is almost certainly benign based on hx and neg pet in 09/2016 with no change on cxr now though is at risk of scar carcinoma, a rarely reported entity now and ? If it even exists   Discussed in detail all the  indications, usual  risks and alternatives  relative to the benefits with patient who agrees to proceed with conservative f/u as outlined and cxr in 6 m then yearly

## 2017-01-07 NOTE — Assessment & Plan Note (Signed)
Quit smoking 07/20/16 - Spirometry 08/10/2016  FEV1 0.90 (39%)  Ratio 54 with classic curvature on spiriva handhaler  - 09/25/2016  After extensive coaching HFA effectiveness =    75% SMI so try spiriva respimat> improved 01/04/2017  So no change rx needed unless aecopd becomes an issue, which has not been the case to date.   I had an extended discussion with the patient reviewing all relevant studies completed to date and  lasting 15 to 20 minutes of a 25 minute visit    Each maintenance medication was reviewed in detail including most importantly the difference between maintenance and prns and under what circumstances the prns are to be triggered using an action plan format that is not reflected in the computer generated alphabetically organized AVS.    Please see AVS for specific instructions unique to this visit that I personally wrote and verbalized to the the pt in detail and then reviewed with pt  by my nurse highlighting any  changes in therapy recommended at today's visit to their plan of care.

## 2017-01-17 ENCOUNTER — Ambulatory Visit: Payer: Medicare Other | Admitting: Pharmacist

## 2017-01-24 ENCOUNTER — Ambulatory Visit (HOSPITAL_COMMUNITY)
Admission: RE | Admit: 2017-01-24 | Discharge: 2017-01-24 | Disposition: A | Discharge status: Home / Self Care | Payer: Medicare Other | Source: Ambulatory Visit | Attending: Family Medicine | Admitting: Family Medicine

## 2017-01-24 DIAGNOSIS — Z1231 Encounter for screening mammogram for malignant neoplasm of breast: Secondary | ICD-10-CM | POA: Insufficient documentation

## 2017-01-25 ENCOUNTER — Encounter: Payer: Self-pay | Admitting: Pharmacist

## 2017-01-25 ENCOUNTER — Ambulatory Visit (INDEPENDENT_AMBULATORY_CARE_PROVIDER_SITE_OTHER): Payer: Medicare Other | Admitting: Pharmacist

## 2017-01-25 DIAGNOSIS — M85851 Other specified disorders of bone density and structure, right thigh: Secondary | ICD-10-CM | POA: Diagnosis not present

## 2017-01-25 DIAGNOSIS — M85852 Other specified disorders of bone density and structure, left thigh: Secondary | ICD-10-CM

## 2017-01-25 NOTE — Patient Instructions (Signed)
Continue calcium and vitamin D               Exercise for Strong Bones  Try [employer] - balance low impact exercises  Exercise is important to build and maintain strong bones / bone density.  There are 2 types of exercises that are important to building and maintaining strong bones:  Weight- bearing and muscle-stregthening.  Weight-bearing Exercises  These exercises include activities that make you move against gravity while staying upright. Weight-bearing exercises can be high-impact or low-impact.  High-impact weight-bearing exercises help build bones and keep them strong. If you have broken a bone due to osteoporosis or are at risk of breaking a bone, you may need to avoid high-impact exercises. If you're not sure, you should check with your healthcare provider.  Examples of high-impact weight-bearing exercises are: Dancing  Doing high-impact aerobics  Hiking  Jogging/running  Jumping Rope  Stair climbing  Tennis  Low-impact weight-bearing exercises can also help keep bones strong and are a safe alternative if you cannot do high-impact exercises.   Examples of low-impact weight-bearing exercises are: Using elliptical training machines  Doing low-impact aerobics  Using stair-step machines  Fast walking on a treadmill or outside   Muscle-Strengthening Exercises These exercises include activities where you move your body, a weight or some other resistance against gravity. They are also known as resistance exercises and include: Lifting weights  Using elastic exercise bands  Using weight machines  Lifting your own body weight  Functional movements, such as standing and rising up on your toes  Yoga and Pilates can also improve strength, balance and flexibility. However, certain positions may not be safe for people with osteoporosis or those at increased risk of broken bones. For example, exercises that have you bend forward may increase the chance of breaking a bone in the  spine.   Non-Impact Exercises There are other types of exercises that can help prevent falls.  Non-impact exercises can help you to improve balance, posture and how well you move in everyday activities. Some of these exercises include: Balance exercises that strengthen your legs and test your balance, such as Tai Chi, can decrease your risk of falls.  Posture exercises that improve your posture and reduce rounded or "sloping" shoulders can help you decrease the chance of breaking a bone, especially in the spine.  Functional exercises that improve how well you move can help you with everyday activities and decrease your chance of falling and breaking a bone. For example, if you have trouble getting up from a chair or climbing stairs, you should do these activities as exercises.   **A physical therapist can teach you balance, posture and functional exercises. He/she can also help you learn which exercises are safe and appropriate for you.  [facility] has a physical therapy office in [place] in front of our office and referrals can be made for assessments and treatment as needed and strength and balance training.  If you would like to have an assessment with [facility] and our physical therapy team please let a nurse or provider know.  Fall Prevention in the Home Falls can cause injuries and can affect people from all age groups. There are many simple things that you can do to make your home safe and to help prevent falls. What can I do on the outside of my home?  Regularly repair the edges of walkways and driveways and fix any cracks.  Remove high doorway thresholds.  Trim any shrubbery  on the main path into your home.  Use bright outdoor lighting.  Clear walkways of debris and clutter, including tools and rocks.  Regularly check that handrails are securely fastened and in good repair. Both sides of any steps should have handrails.  Install guardrails along the edges of any raised decks or  porches.  Have leaves, snow, and ice cleared regularly.  Use sand or salt on walkways during winter months.  In the garage, clean up any spills right away, including grease or oil spills. What can I do in the bathroom?  Use night lights.  Install grab bars by the toilet and in the tub and shower. Do not use towel bars as grab bars.  Use non-skid mats or decals on the floor of the tub or shower.  If you need to sit down while you are in the shower, use a plastic, non-slip stool.  Keep the floor dry. Immediately clean up any water that spills on the floor.  Remove soap buildup in the tub or shower on a regular basis.  Attach bath mats securely with double-sided non-slip rug tape.  Remove throw rugs and other tripping hazards from the floor. What can I do in the bedroom?  Use night lights.  Make sure that a bedside light is easy to reach.  Do not use oversized bedding that drapes onto the floor.  Have a firm chair that has side arms to use for getting dressed.  Remove throw rugs and other tripping hazards from the floor. What can I do in the kitchen?  Clean up any spills right away.  Avoid walking on wet floors.  Place frequently used items in easy-to-reach places.  If you need to reach for something above you, use a sturdy step stool that has a grab bar.  Keep electrical cables out of the way.  Do not use floor polish or wax that makes floors slippery. If you have to use wax, make sure that it is non-skid floor wax.  Remove throw rugs and other tripping hazards from the floor. What can I do in the stairways?  Do not leave any items on the stairs.  Make sure that there are handrails on both sides of the stairs. Fix handrails that are broken or loose. Make sure that handrails are as long as the stairways.  Check any carpeting to make sure that it is firmly attached to the stairs. Fix any carpet that is loose or worn.  Avoid having throw rugs at the top or bottom  of stairways, or secure the rugs with carpet tape to prevent them from moving.  Make sure that you have a light switch at the top of the stairs and the bottom of the stairs. If you do not have them, have them installed. What are some other fall prevention tips?  Wear closed-toe shoes that fit well and support your feet. Wear shoes that have rubber soles or low heels.  When you use a stepladder, make sure that it is completely opened and that the sides are firmly locked. Have someone hold the ladder while you are using it. Do not climb a closed stepladder.  Add color or contrast paint or tape to grab bars and handrails in your home. Place contrasting color strips on the first and last steps.  Use mobility aids as needed, such as canes, walkers, scooters, and crutches.  Turn on lights if it is dark. Replace any light bulbs that burn out.  Set up furniture so that  there are clear paths. Keep the furniture in the same spot.  Fix any uneven floor surfaces.  Choose a carpet design that does not hide the edge of steps of a stairway.  Be aware of any and all pets.  Review your medicines with your healthcare provider. Some medicines can cause dizziness or changes in blood pressure, which increase your risk of falling. Talk with your health care provider about other ways that you can decrease your risk of falls. This may include working with a physical therapist or trainer to improve your strength, balance, and endurance. This information is not intended to replace advice given to you by your health care provider. Make sure you discuss any questions you have with your health care provider. Document Released: 05/26/2002 Document Revised: 11/02/2015 Document Reviewed: 07/10/2014 Elsevier Interactive Patient Education  2017 [employer].

## 2017-01-25 NOTE — Progress Notes (Signed)
Patient ID: Erin Guerrero, female   DOB: 1950-09-23, 66 y.o.   MRN: [id number]        HPI:   Back Pain?  Yes - occassial - history back surgery 1980's      Kyphosis?  No Prior fracture?  Yes - hand in her 32's - fell on brick steps Med(s) for Osteoporosis/Osteopenia:   Med(s) previously tried for Osteoporosis/Osteopenia:  none                                                             PMH: Age at menopause:  In 50's  Hysterectomy?  No Oophorectomy?  No HRT? No Steroid Use?  No Thyroid med?  No History of cancer?  No History of digestive disorders (ie Crohn's)?  No Current or previous eating disorders?  No Last Vitamin D Result:  Not checked in our office Last GFR Result:  88 (12/28/2016)   FH/SH: Family history of osteoporosis?  No Parent with history of hip fracture?  No Family history of breast cancer?  No Exercise?  No Smoking?  No - but only recently stopped Alcohol?  Yes - glass of wine less than once per week    Calcium Assessment Calcium Intake  # of servings/day  Calcium mg  Milk (8 oz) 0.5  x  300  = 150mg   Yogurt (4 oz) 0.5 x  200 = 100mg   Cheese (1 oz) 0.5 x  200 = 100mg   Other Calcium sources   250mg   Ca supplement 1200mg  = 1200mg    Estimated calcium intake per day 1800mg     DEXA Results Date of Test T-Score for AP Spine L1-L4 T-Score for Total Left Hip  09/01/2010 [facility]) 0.6 -1.6  12/28/2016 0.0 -1.4           FRAX 10 year estimate: Total FX risk:  14.9%  (consider medication if >/= 20%) Hip FX risk:  2.7%  (consider medication if >/= 3%)  Assessment: Osteopenia - low FRAX risk   Recommendations: 1.  Discussed BMD  / DEXA results and discussed fracture risk. 2.  continue calcium 1200mg  daily through supplementation or diet.  3.  recommend weight bearing exercise - 30 minutes at least 4 days per week.   4.  Counseled and educated about fall risk and prevention. 5.  Discussed smoking's effects on bones.  Commended patient on her  quitting for last 4 months.  Recommended continue to sustain from cigarette smoking.  Recheck DEXA:  2 years  Time spent counseling patient:  25 minutes

## 2017-03-16 ENCOUNTER — Telehealth: Payer: Self-pay | Admitting: Internal Medicine

## 2017-03-16 MED ORDER — TIOTROPIUM BROMIDE MONOHYDRATE 2.5 MCG/ACT IN AERS: INHALATION_SPRAY | RESPIRATORY_TRACT | 3 refills | Status: DC

## 2017-03-16 NOTE — Telephone Encounter (Signed)
Called and spoke with pt and she is aware of refill of medication that has been sent to her mail order pharmacy.

## 2017-04-19 DIAGNOSIS — Z23 Encounter for immunization: Secondary | ICD-10-CM | POA: Diagnosis not present

## 2017-07-02 ENCOUNTER — Encounter: Payer: Self-pay | Admitting: Family Medicine

## 2017-07-02 ENCOUNTER — Ambulatory Visit (INDEPENDENT_AMBULATORY_CARE_PROVIDER_SITE_OTHER): Payer: Medicare Other | Admitting: Family Medicine

## 2017-07-02 VITALS — BP 128/70 | HR 65 | Temp 97.3°F | Ht 63.0 in | Wt 184.0 lb

## 2017-07-02 DIAGNOSIS — Z13220 Encounter for screening for lipoid disorders: Secondary | ICD-10-CM | POA: Diagnosis not present

## 2017-07-02 DIAGNOSIS — J449 Chronic obstructive pulmonary disease, unspecified: Secondary | ICD-10-CM

## 2017-07-02 DIAGNOSIS — I10 Essential (primary) hypertension: Secondary | ICD-10-CM | POA: Diagnosis not present

## 2017-07-02 DIAGNOSIS — E663 Overweight: Secondary | ICD-10-CM

## 2017-07-02 DIAGNOSIS — Z1159 Encounter for screening for other viral diseases: Secondary | ICD-10-CM

## 2017-07-02 MED ORDER — IRBESARTAN 150 MG PO TABS: 150.0000 mg | ORAL_TABLET | Freq: Every day | ORAL | 1 refills | Status: DC

## 2017-07-02 MED ORDER — TIOTROPIUM BROMIDE MONOHYDRATE 2.5 MCG/ACT IN AERS: INHALATION_SPRAY | RESPIRATORY_TRACT | 3 refills | Status: DC

## 2017-07-02 NOTE — Progress Notes (Signed)
BP 128/70   Pulse 65   Temp (!) 97.3 F (36.3 C) (Oral)   Ht 5' 3"  (1.6 m)   Wt 184 lb (83.5 kg)   BMI 32.59 kg/m    Subjective:    Patient ID: Erin Guerrero, female    DOB: 11/25/1950, 67 y.o.   MRN: [id number]  HPI: Erin Guerrero is a 67 y.o. female presenting on 07/02/2017 for COPD (6 mo) and Hypertension   HPI Hypertension Patient is currently on irbesartan, and their blood pressure today is 128 over. Patient denies any lightheadedness or dizziness. Patient denies headaches, blurred vision, chest pains, shortness of breath, or weakness. Denies any side effects from medication and is content with current medication.   COPD Patient says she has had a few times where she is lapsed back on taking cigarettes again but has quit again and has been at least a couple weeks without it.  She is still using the Spiriva and denies any major symptoms of breath or wheezing.  She says she was having a little more issues with started back up smoking but is doing better now and is almost back to where she was before she relapsed.  She denies any coughing or wheezing symptoms currently and denies any nighttime symptoms currently.   Relevant past medical, surgical, family and social history reviewed and updated as indicated. Interim medical history since our last visit reviewed. Allergies and medications reviewed and updated.  Review of Systems  Constitutional: Negative for chills and fever.  Eyes: Negative for visual disturbance.  Respiratory: Negative for cough, chest tightness, shortness of breath and wheezing.   Cardiovascular: Negative for chest pain and leg swelling.  Musculoskeletal: Negative for back pain and gait problem.  Skin: Negative for rash.  Neurological: Positive for numbness (Patient complained of some numbness in her hands intermittently, she does not have it currently today.  She thinks it may be her neck but she does not want to go for yet). Negative for dizziness,  light-headedness and headaches.  Psychiatric/Behavioral: Negative for agitation and behavioral problems.  All other systems reviewed and are negative.   Per HPI unless specifically indicated above     Objective:    BP 128/70   Pulse 65   Temp (!) 97.3 F (36.3 C) (Oral)   Ht 5' 3"  (1.6 m)   Wt 184 lb (83.5 kg)   BMI 32.59 kg/m   Wt Readings from Last 3 Encounters:  07/02/17 184 lb (83.5 kg)  01/04/17 172 lb (78 kg)  12/28/16 175 lb (79.4 kg)    Physical Exam  Constitutional: She is oriented to person, place, and time. She appears well-developed and well-nourished. No distress.  Eyes: Conjunctivae are normal.  Neck: Neck supple. No thyromegaly present.  Cardiovascular: Normal rate, regular rhythm, normal heart sounds and intact distal pulses.  No murmur heard. Pulmonary/Chest: Effort normal and breath sounds normal. No respiratory distress. She has no wheezes. She has no rales.  Musculoskeletal: Normal range of motion. She exhibits no edema or tenderness.  Lymphadenopathy:    She has no cervical adenopathy.  Neurological: She is alert and oriented to person, place, and time. Coordination normal.  Skin: Skin is warm and dry. No rash noted. She is not diaphoretic.  Psychiatric: She has a normal mood and affect. Her behavior is normal.  Nursing note and vitals reviewed.     Assessment & Plan:   Problem List Items Addressed This Visit      Cardiovascular and  Mediastinum   HTN (hypertension) - Primary   Relevant Medications   irbesartan (AVAPRO) 150 MG tablet   Other Relevant Orders   CMP14+EGFR     Respiratory   COPD  GOLD III    Relevant Medications   Tiotropium Bromide Monohydrate (SPIRIVA RESPIMAT) 2.5 MCG/ACT AERS     Other   Overweight (BMI 25.0-29.9)    Other Visit Diagnoses    Lipid screening       Relevant Orders   Lipid panel   Need for hepatitis C screening test       Relevant Orders   Hepatitis C antibody      Follow up plan: Return in about 6  months (around 12/30/2017), or if symptoms worsen or fail to improve, for Hypertension recheck.  Counseling provided for all of the vaccine components No orders of the defined types were placed in this encounter.   Caryl Pina, MD [facility] 07/02/2017, 8:12 AM

## 2017-07-03 LAB — CMP14+EGFR
A/G RATIO: 1.8 (ref 1.2–2.2)
ALBUMIN: 4.3 g/dL (ref 3.6–4.8)
ALT: 39 IU/L — ABNORMAL HIGH (ref 0–32)
AST: 22 IU/L (ref 0–40)
Alkaline Phosphatase: 89 IU/L (ref 39–117)
BILIRUBIN TOTAL: 0.2 mg/dL (ref 0.0–1.2)
BUN / CREAT RATIO: 25 (ref 12–28)
BUN: 15 mg/dL (ref 8–27)
CALCIUM: 9.7 mg/dL (ref 8.7–10.3)
CO2: 21 mmol/L (ref 20–29)
Chloride: 106 mmol/L (ref 96–106)
Creatinine, Ser: 0.61 mg/dL (ref 0.57–1.00)
GFR, EST AFRICAN AMERICAN: 108 mL/min/{1.73_m2} (ref >59)
GFR, EST NON AFRICAN AMERICAN: 94 mL/min/{1.73_m2} (ref >59)
Globulin, Total: 2.4 g/dL (ref 1.5–4.5)
Glucose: 91 mg/dL (ref 65–99)
POTASSIUM: 4.4 mmol/L (ref 3.5–5.2)
Sodium: 142 mmol/L (ref 134–144)
TOTAL PROTEIN: 6.7 g/dL (ref 6.0–8.5)

## 2017-07-03 LAB — LIPID PANEL
CHOL/HDL RATIO: 4.4 ratio (ref 0.0–4.4)
Cholesterol, Total: 249 mg/dL — ABNORMAL HIGH (ref 100–199)
HDL: 57 mg/dL (ref >39)
LDL Calculated: 171 mg/dL — ABNORMAL HIGH (ref 0–99)
Triglycerides: 103 mg/dL (ref 0–149)
VLDL CHOLESTEROL CAL: 21 mg/dL (ref 5–40)

## 2017-07-03 LAB — HEPATITIS C ANTIBODY

## 2017-07-09 ENCOUNTER — Ambulatory Visit: Payer: Medicare Other | Admitting: Internal Medicine

## 2017-07-17 ENCOUNTER — Ambulatory Visit (INDEPENDENT_AMBULATORY_CARE_PROVIDER_SITE_OTHER): Payer: Medicare Other | Admitting: Internal Medicine

## 2017-07-17 ENCOUNTER — Encounter: Payer: Self-pay | Admitting: Internal Medicine

## 2017-07-17 VITALS — BP 144/86 | HR 78 | Ht 63.0 in | Wt 184.2 lb

## 2017-07-17 DIAGNOSIS — J449 Chronic obstructive pulmonary disease, unspecified: Secondary | ICD-10-CM | POA: Diagnosis not present

## 2017-07-17 DIAGNOSIS — R911 Solitary pulmonary nodule: Secondary | ICD-10-CM

## 2017-07-17 NOTE — Assessment & Plan Note (Addendum)
See CT chest 08/01/16 with prior h/o pna in [facility] 01/18/14 large as dz in same distribution LUL apicoposterior segment  - PET rec 10/05/2016 > neg c/w scarring from pna same location     Discussed in detail all the  indications, usual  risks and alternatives  relative to the benefits with patient who did not want xray today and does not want to participate in LDCT screening either > agreed to r/u cxr in 6 m    > 50 % of 25 min ov spent in counseling

## 2017-07-17 NOTE — Progress Notes (Signed)
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 Initial evaluation reveals acute respiratory failure  Information is obtained from the patient. She states that 2 weeks ago she developed worsening shortness of breath fever chills headache. She saw a primary care provider tested positive for influenza was treated with Tamiflu for 10 days and completed a Z-Pak. She reports the "flulike symptoms" improved but the shortness of breath persisted and worsened. She was given Levaquin and is currently taking Levaquin. She denies chest pain palpitations cough fever chills headache syncope or near-syncope. She denies lower extremity edema but does endorse some orthopnea. She continues to smoke and has not been officially diagnosed with COPD but reports "chronic bronchitis". She has an inhaler that she uses on  occasion but here lately it has not helped. She also reportsabdominal pain non-radiating in epigastric area relieved with belching. sheendorses some nausea without vomiting. She denies dysuria hematuria frequency or urgency. She denies diarrhea constipation melena or bright red blood per rectum. He states she normally has a slight wheeze but over the last 2 days this has diminished."  Hospital Course:   Summary of her active problems in the hospital is as following. 1. Acute respiratory failure with hypoxia likely related to recent community-acquired pneumonia/influenza in the setting of chronic bronchitis in a tobacco user. Oxygen saturation level dropped to 68s in ER with ambulation on room air. Chest x-ray with no active cardiopulmonary disease but with nodule that appears stable.  She is afebrile no leukocytosis and dynamically stable and not hypoxic.  D-dimer when adjusted for age within limits of normal. We will continue regimen initiated with inhalers, steroids and antibiotics.  2. Pulmonary nodule. Discussed with pulmonary, patient will follow-up with outpatient for further workup. May be amenable to CT-guided biopsy.  3. Tachycardia. Sinus Due to inhalers. Monitor.  4., Abdominal pain/nausea. Resolved.  5. Tobacco use. -Cessation counseling offered     08/10/2016 1st [facility] office visit/ Erin Guerrero  Re abn cxr  Chief Complaint  Patient presents with  . Pulmonary Consult    Referred by [facility] for eval of pulmonary nodule. Pt states she had the flu, and then was thought to have PNA and this is why CXR was done and then CT Chest. She states  her breathing is at baseline now and no co's today.   Completely back to nl but now on spiriva and prn saba  Doe = MMRC1 = can walk nl pace, flat grade, can't hurry or go uphills or steps s sob  rec Plan A = Automatic = spiriva each am  Plan B = Backup Only use your albuterol as a rescue medication  Congratulations  on not smoking, it's the most important aspect of your care    09/25/2016  f/u ov/Erin Guerrero re:  GOLD III copd/ spiriva handhaler/ f/u SPN LUL   Chief Complaint  Patient presents with  . Follow-up    pt reports she is doing ok but started smoking a little bit, she is doing better no SOB, thinks Spiriva is helping  doe still MMRC =1  rec Please see patient coordinator before you leave today  to schedule PET scan and I will call you with the results In the meantime try to stop all smoking if possible  Start spiriva respimat 2.5  X  2 puffs each am  (one capsule of handihaler)       01/04/2017  f/u ov/Erin Guerrero re: last cig x 3 m/ maint rx spiriva daily / rare saba  Chief Complaint  Patient presents with  . Follow-up    Pt has been doing good since last visit; no complaints or concerns. Denies any SOB, cough, or CP.   Doe still MMRC1 = can walk nl pace, flat grade, can't hurry or go uphills or steps s sob improved on spiriva   rec No change rx cxr on return   07/17/2017  f/u ov/Erin Guerrero re:  Gold III copd/ LUL apical scarring p pna in [place] 01/2014 with neg pet 10/05/16  Chief Complaint  Patient presents with  . Follow-up    Breathing is doing well. She rarely uses her albuterol inhaler.   Dyspnea:  Still = MMRC 1 / better on spiriva than off  Cough: not an issue Sleep: flat    No obvious day to day or daytime variability or assoc excess/ purulent sputum or mucus plugs or hemoptysis or cp or chest tightness, subjective wheeze or overt sinus or hb symptoms. No unusual exposure hx or h/o childhood pna/ asthma or knowledge of premature birth.  Sleeping ok flat without nocturnal  or early am exacerbation  of respiratory  c/o's or need for noct saba. Also denies any obvious fluctuation of symptoms with weather or environmental changes or other aggravating or alleviating factors except as outlined above   Current Allergies, Complete Past Medical History, Past Surgical History, Family History, and  Social History were reviewed in [employer] record.   ROS  The following are not active complaints unless bolded Hoarseness, sore throat, dysphagia, dental problems, itching, sneezing,  nasal congestion or discharge of excess mucus or purulent secretions, ear ache,   fever, chills, sweats, unintended wt loss or wt gain, classically pleuritic or exertional cp,  orthopnea pnd or leg swelling, presyncope, palpitations, abdominal pain, anorexia, nausea, vomiting, diarrhea  or change in bowel habits or change in bladder habits, change in stools or change in urine, dysuria, hematuria,  rash, arthralgias, visual complaints, headache, numbness, weakness or ataxia or problems with walking or coordination,  change in mood/affect or memory.        Current Meds  Medication Sig  . albuterol (PROVENTIL HFA;VENTOLIN HFA) 108 (90 Base) MCG/ACT inhaler Inhale 1-2 puffs into the lungs every 6 (six) hours as needed for  wheezing or shortness of breath.  Marland Kitchen aspirin EC 81 MG tablet Take 81 mg by mouth daily.  . Calcium Carbonate-Vit D-Min (CALCIUM 1200 PO) Take 1,200 mg by mouth daily.  . cetirizine (ZYRTEC) 10 MG tablet Take 10 mg by mouth daily.  . cholecalciferol (VITAMIN D) 1000 units tablet Take 2,000 Units by mouth daily.  . diazepam (VALIUM) 5 MG tablet One every 6 hours for muscle spasm  . ibuprofen (ADVIL,MOTRIN) 800 MG tablet Take 800 mg by mouth every 8 (eight) hours as needed.  . irbesartan (AVAPRO) 150 MG tablet Take 1 tablet (150 mg total) by mouth daily.  . Tiotropium Bromide Monohydrate (SPIRIVA RESPIMAT) 2.5 MCG/ACT AERS inhale 2 puffs once daily  . triamcinolone (NASACORT) 55 MCG/ACT AERO nasal inhaler Place 2 sprays into the nose daily as needed (allergies).                           Objective:   Physical Exam   amb wf nad  07/17/2017        184  01/04/2017        172  09/25/2016          168  08/10/16 159 lb 9.6 oz (72.4 kg)  08/05/16 155 lb 12.8 oz (70.7  kg)  03/16/16 170 lb (77.1 kg)            HEENT: nl dentition, turbinates bilaterally, and oropharynx. Nl external ear canals without cough reflex   NECK :  without JVD/Nodes/TM/ nl carotid upstrokes bilaterally   LUNGS: no acc muscle use,  Nl contour chest with slt decreased bs bilaterally/ no wheeze    CV:  RRR  no s3 or murmur or increase in P2, and no edema   ABD:  soft and nontender with nl inspiratory excursion in the supine position. No bruits or organomegaly appreciated, bowel sounds nl  MS:  Nl gait/ ext warm without deformities, calf tenderness, cyanosis or clubbing No obvious joint restrictions   SKIN: warm and dry without lesions    NEURO:  alert, approp, nl sensorium with  no motor or cerebellar deficits apparent.        CXR PA and Lateral:   07/17/2017 :    I personally reviewed images and agree with radiology impression as follows:    declined  -see a/p        Assessment:

## 2017-07-17 NOTE — Patient Instructions (Signed)
Please schedule a follow up office visit in 6  Months -  call sooner if needed with cxr on return

## 2017-07-17 NOTE — Assessment & Plan Note (Signed)
Quit smoking 07/20/16 - Spirometry 08/10/2016  FEV1 0.90 (39%)  Ratio 54 with classic curvature on spiriva handhaler  - 09/25/2016  After extensive coaching HFA effectiveness =    75% SMI so try spiriva respimat> improved   Clearly better on spiriva > no change rx needed   Formulary restrictions will be an ongoing challenge for the forseable future and I would be happy to pick an alternative if the pt will first  provide me a list of them but pt  will need to return here for training for any new device that is required eg dpi vs hfa vs respimat.    In meantime we can always provide samples so the patient never runs out of any needed respiratory medications.   Each maintenance medication was reviewed in detail including most importantly the difference between maintenance and as needed and under what circumstances the prns are to be used.  Please see AVS for specific  Instructions which are unique to this visit and I personally typed out  which were reviewed in detail in writing with the patient and a copy provided.

## 2017-10-17 ENCOUNTER — Telehealth: Payer: Self-pay | Admitting: *Deleted

## 2017-10-17 DIAGNOSIS — I10 Essential (primary) hypertension: Secondary | ICD-10-CM

## 2017-10-17 MED ORDER — IRBESARTAN 150 MG PO TABS: 150.0000 mg | ORAL_TABLET | Freq: Every day | ORAL | 0 refills | Status: DC

## 2017-10-17 NOTE — Telephone Encounter (Signed)
Pt aware refill sent to pharmacy Next appt 12/31/17

## 2017-11-04 ENCOUNTER — Other Ambulatory Visit: Payer: Self-pay | Admitting: Internal Medicine

## 2017-11-04 DIAGNOSIS — J449 Chronic obstructive pulmonary disease, unspecified: Secondary | ICD-10-CM

## 2017-12-26 IMAGING — DX DG CHEST 2V
2 series · 2 of 2 positions shown · non-contrast
Comparison: None in PACs

CLINICAL DATA: Shortness of breath and cough for the past week
following a flu syndrome. History of hypertension, smoker.

EXAM:
CHEST  2 VIEW

[chest pa]
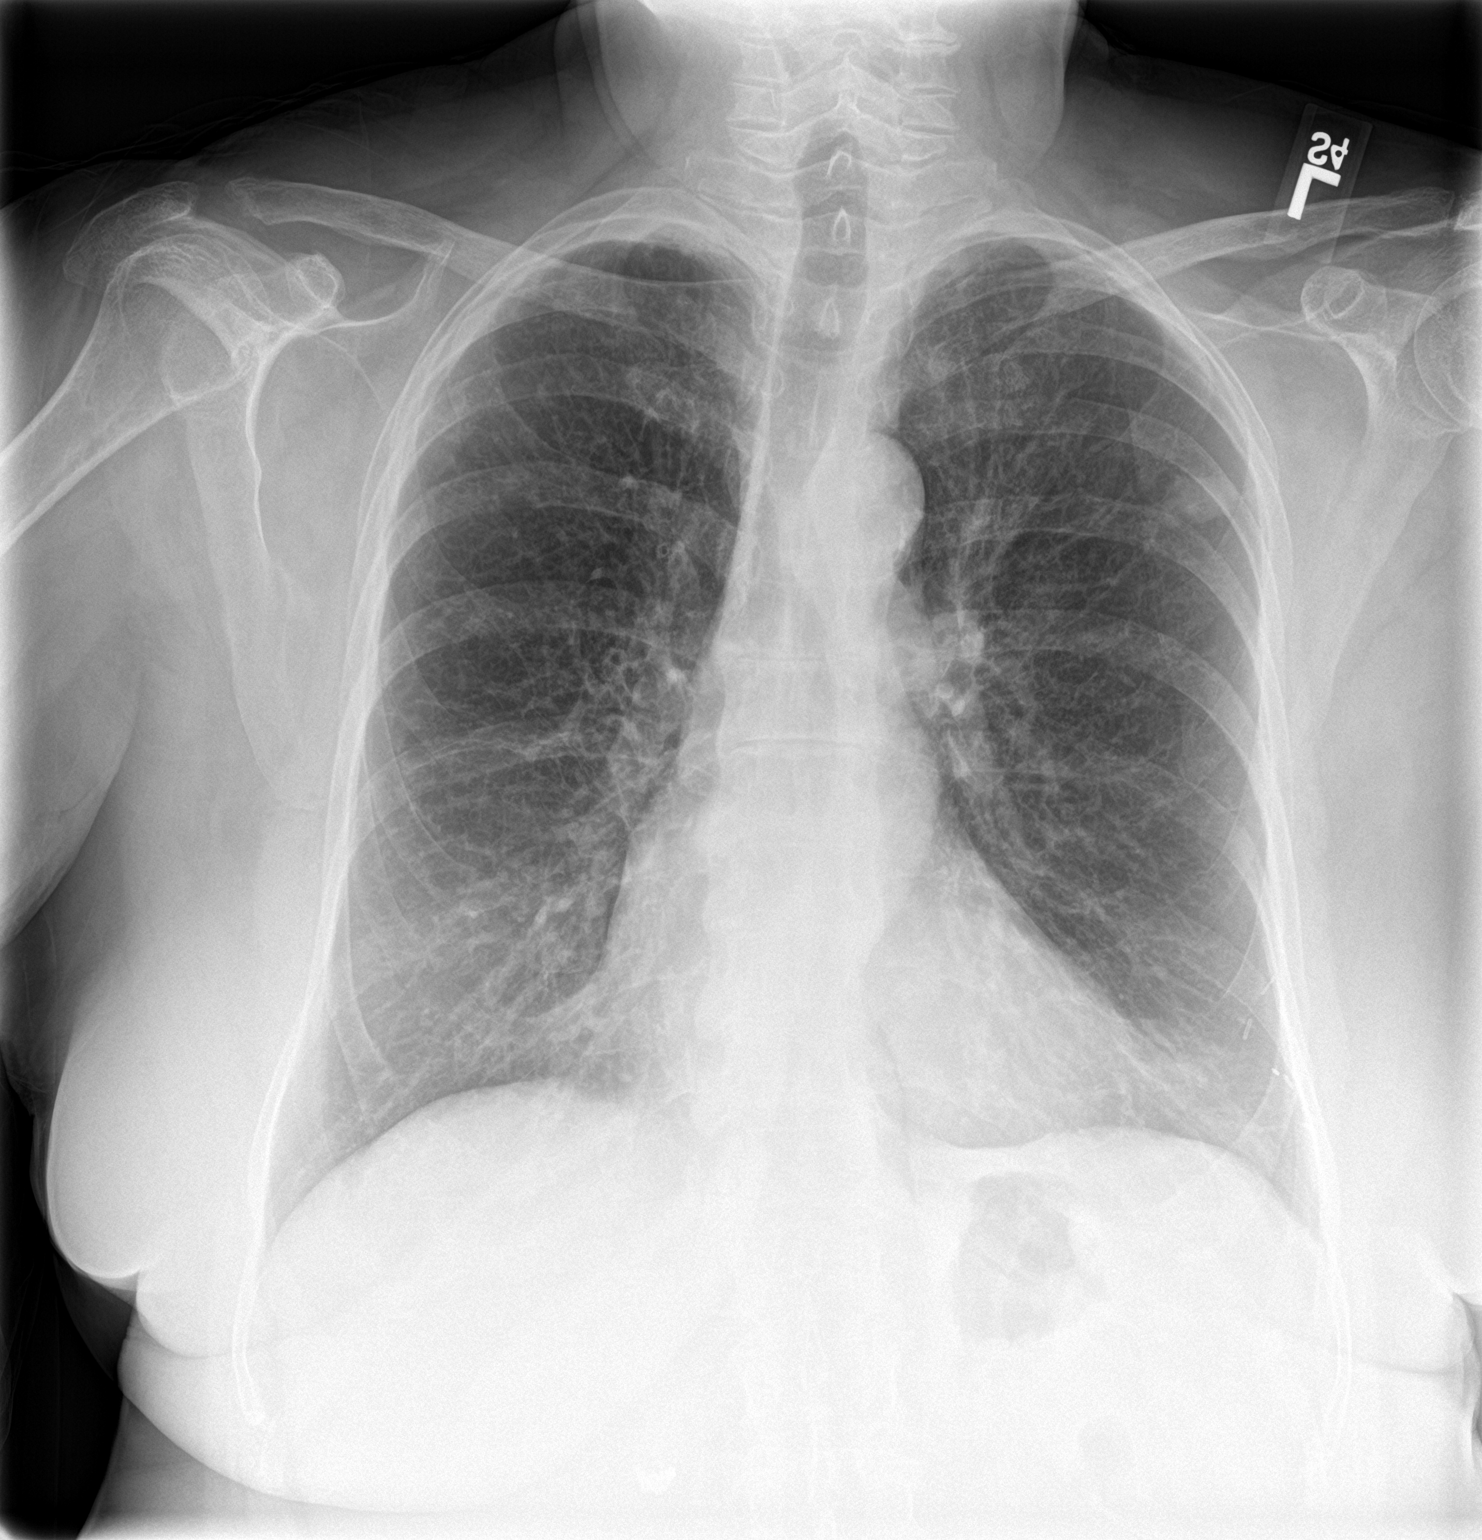

[chest lat]
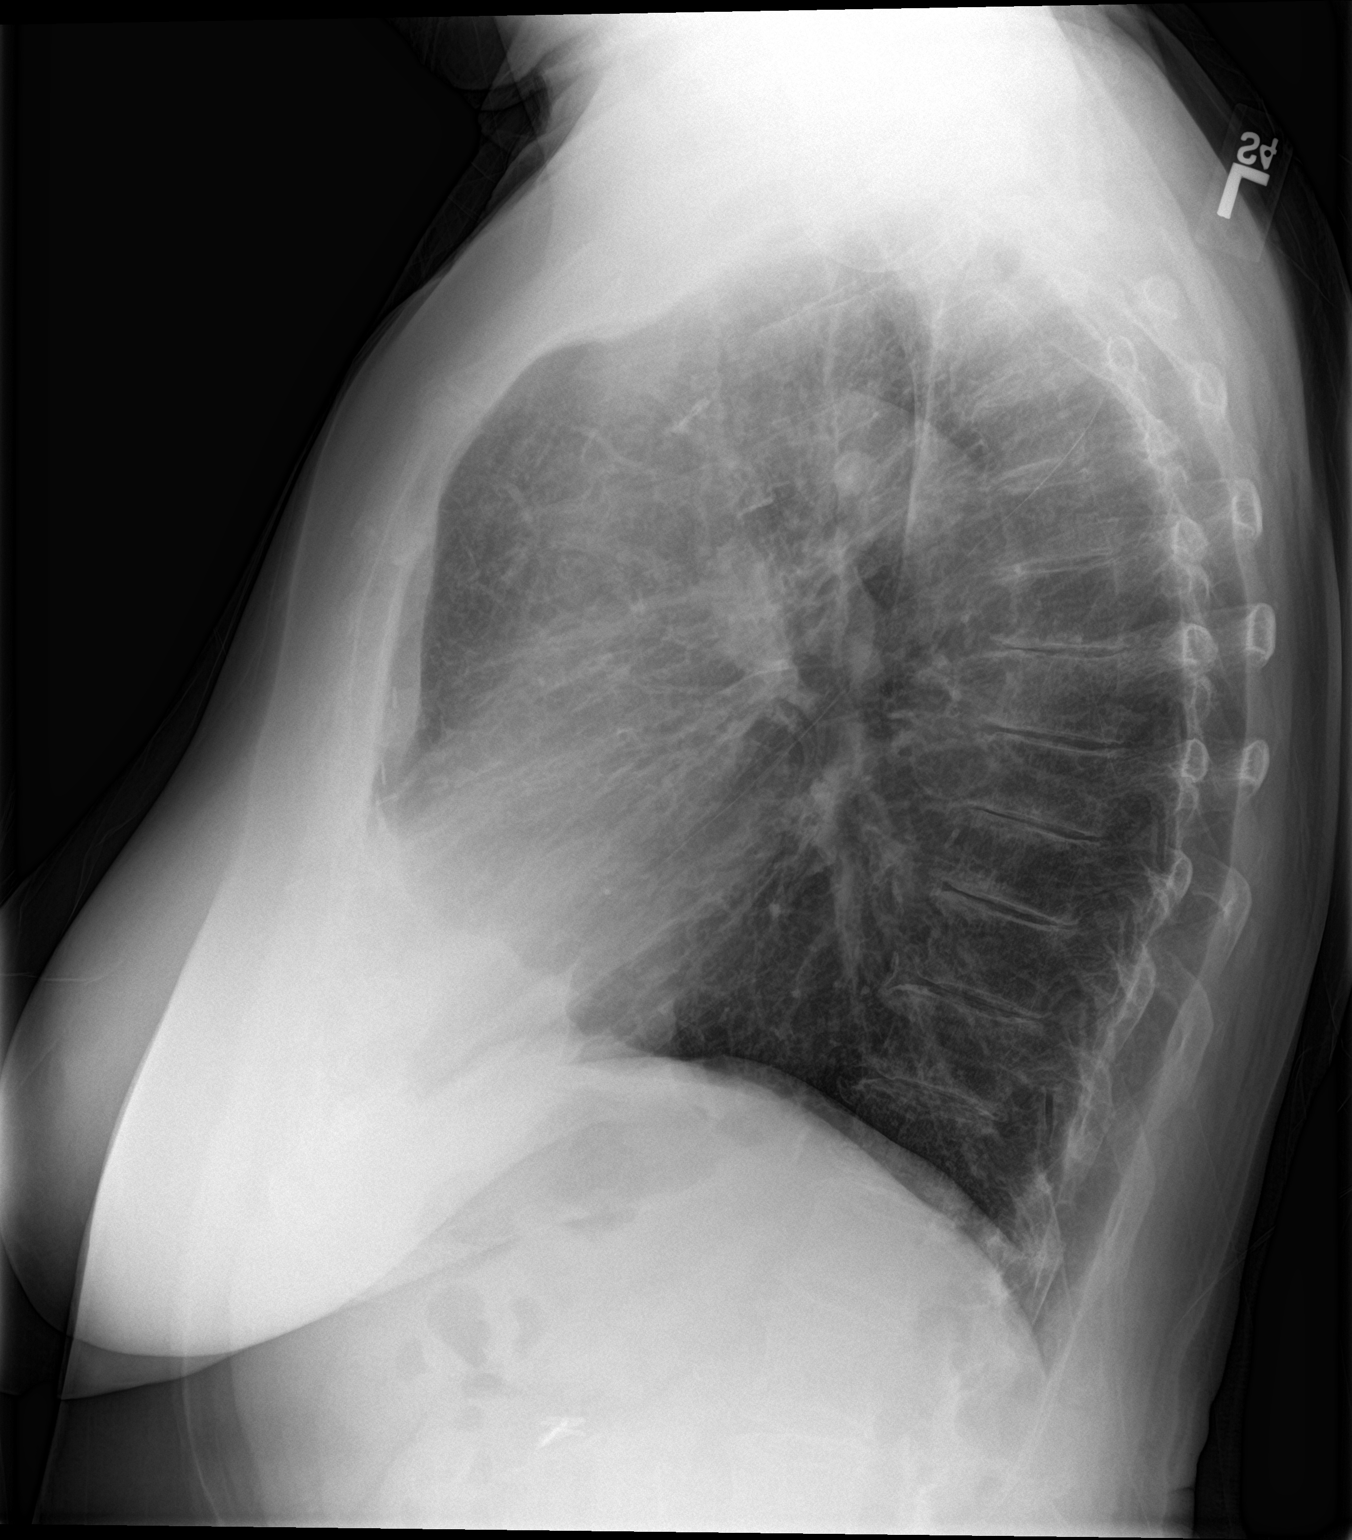

[2 of 2 positions shown; findings below may reference images not displayed]

FINDINGS: The lungs are hyperinflated. The interstitial markings are coarse.
There is confluent density peripherally in the left upper lobe
measuring approximately 1.5 x 2 cm. The heart and pulmonary
vascularity are normal. The mediastinum is normal in width. There is
calcification in the wall of the tortuous thoracic aorta. There is
mild multilevel degenerative disc disease of the thoracic spine.
There is mild widening of the right AC joint.
IMPRESSION: Ovoid soft tissue masslike density in the left upper lobe. The
appearance is atypical for pneumonia. Chest CT scanning is
recommended.

Chronic bronchitic changes.

Thoracic aortic atherosclerosis.

These results will be called to the ordering clinician or
representative by the Radiologist Assistant, and communication
documented in the PACS or zVision Dashboard.

## 2017-12-29 IMAGING — CR DG CHEST 2V
2 series · 2 of 2 positions shown · non-contrast
Comparison: Prior radiograph and CT from 08/01/2016.

CLINICAL DATA: Initial evaluation for acute chest pain.

EXAM:
CHEST  2 VIEW

[chest pa]
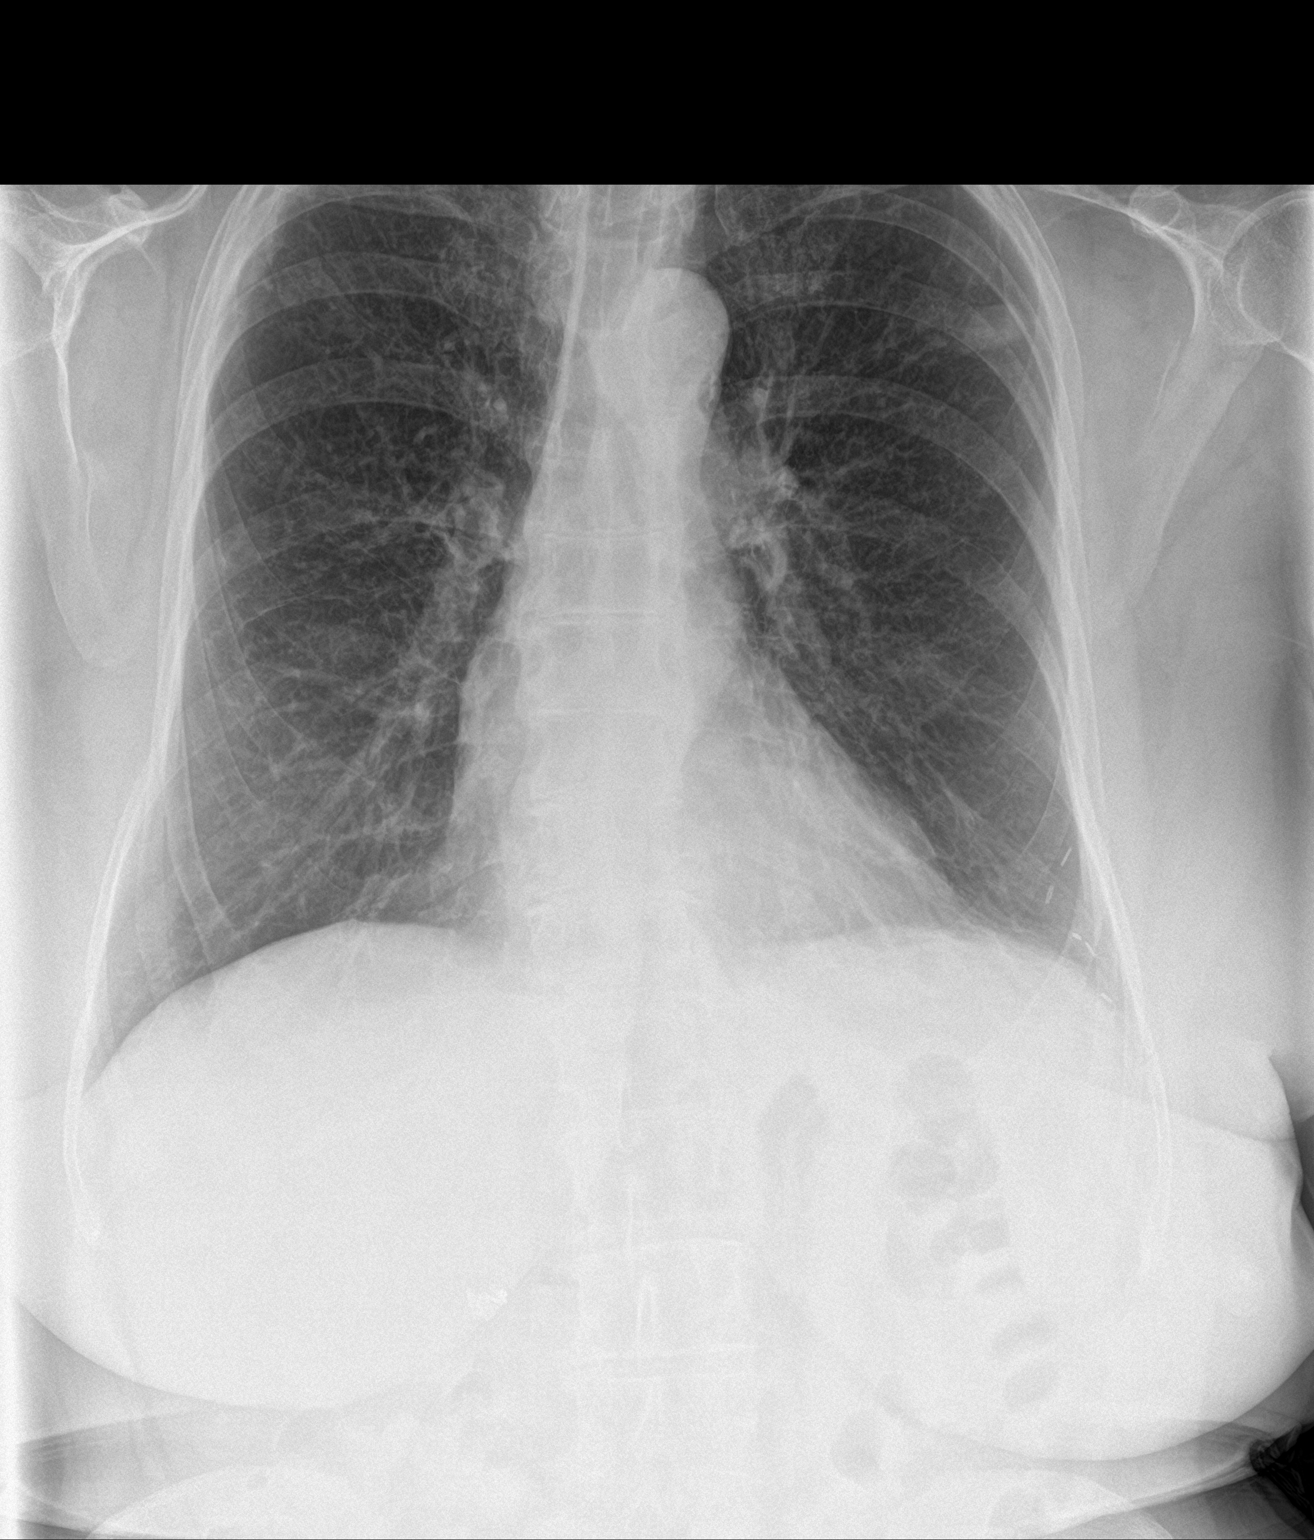

[chest lat]
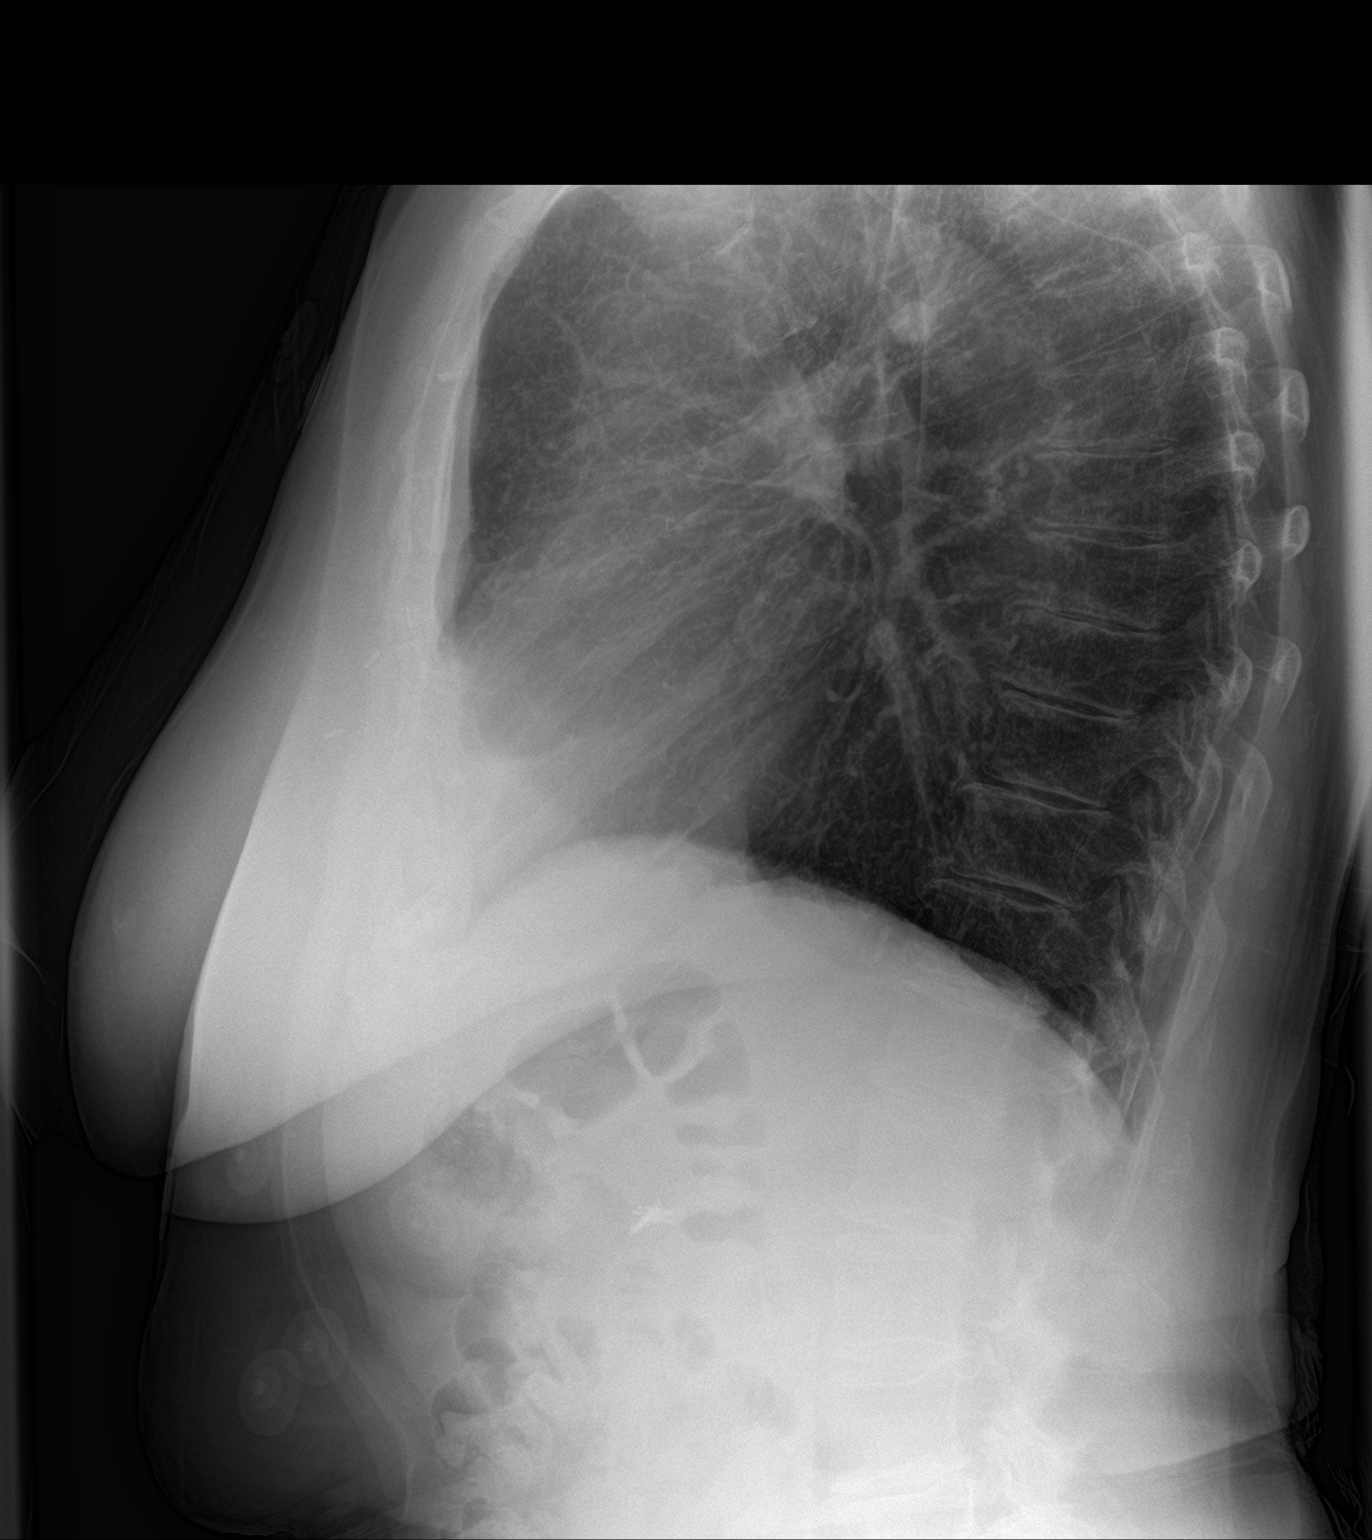

[2 of 2 positions shown; findings below may reference images not displayed]

FINDINGS: Cardiac and mediastinal silhouettes are stable in size and contour,
and remain within normal limits.

Lungs normally inflated. Chronic coarsening of the interstitial
markings is similar to previous. No focal infiltrates. No pulmonary
edema or pleural effusion. No pneumothorax. Known nodule at the
peripheral early of the left upper lobe again seen, measuring 11 x
21 mm on today's study, similar to previous.

No acute osseous abnormality. Surgical clips overlie the lower left
chest.
IMPRESSION: 1. No active cardiopulmonary disease.
2. 11 x 21 mm nodule overlying the peripheral left upper lobe,
similar to previous studies.

## 2017-12-31 ENCOUNTER — Ambulatory Visit (INDEPENDENT_AMBULATORY_CARE_PROVIDER_SITE_OTHER): Payer: Medicare Other | Admitting: Family Medicine

## 2017-12-31 ENCOUNTER — Encounter: Payer: Self-pay | Admitting: Family Medicine

## 2017-12-31 VITALS — BP 116/69 | HR 74 | Temp 97.9°F | Ht 63.0 in | Wt 176.0 lb

## 2017-12-31 DIAGNOSIS — Z23 Encounter for immunization: Secondary | ICD-10-CM | POA: Diagnosis not present

## 2017-12-31 DIAGNOSIS — I10 Essential (primary) hypertension: Secondary | ICD-10-CM

## 2017-12-31 DIAGNOSIS — J449 Chronic obstructive pulmonary disease, unspecified: Secondary | ICD-10-CM

## 2017-12-31 DIAGNOSIS — E669 Obesity, unspecified: Secondary | ICD-10-CM | POA: Diagnosis not present

## 2017-12-31 DIAGNOSIS — Z13220 Encounter for screening for lipoid disorders: Secondary | ICD-10-CM

## 2017-12-31 LAB — CBC WITH DIFFERENTIAL/PLATELET
Basophils Absolute: 0 10*3/uL (ref 0.0–0.2)
Basos: 0 %
EOS (ABSOLUTE): 0.1 10*3/uL (ref 0.0–0.4)
EOS: 2 %
HEMATOCRIT: 43.6 % (ref 34.0–46.6)
Hemoglobin: 14.8 g/dL (ref 11.1–15.9)
IMMATURE GRANS (ABS): 0 10*3/uL (ref 0.0–0.1)
IMMATURE GRANULOCYTES: 0 %
LYMPHS: 27 %
Lymphocytes Absolute: 1.8 10*3/uL (ref 0.7–3.1)
MCH: 30.9 pg (ref 26.6–33.0)
MCHC: 33.9 g/dL (ref 31.5–35.7)
MCV: 91 fL (ref 79–97)
MONOCYTES: 10 %
Monocytes Absolute: 0.7 10*3/uL (ref 0.1–0.9)
Neutrophils Absolute: 3.9 10*3/uL (ref 1.4–7.0)
Neutrophils: 61 %
Platelets: 204 10*3/uL (ref 150–450)
RBC: 4.79 x10E6/uL (ref 3.77–5.28)
RDW: 13.4 % (ref 12.3–15.4)
WBC: 6.5 10*3/uL (ref 3.4–10.8)

## 2017-12-31 LAB — CMP14+EGFR
ALBUMIN: 4.3 g/dL (ref 3.6–4.8)
ALT: 28 IU/L (ref 0–32)
AST: 20 IU/L (ref 0–40)
Albumin/Globulin Ratio: 1.9 (ref 1.2–2.2)
Alkaline Phosphatase: 98 IU/L (ref 39–117)
BILIRUBIN TOTAL: 0.6 mg/dL (ref 0.0–1.2)
BUN / CREAT RATIO: 23 (ref 12–28)
BUN: 16 mg/dL (ref 8–27)
CALCIUM: 9.9 mg/dL (ref 8.7–10.3)
CHLORIDE: 104 mmol/L (ref 96–106)
CO2: 22 mmol/L (ref 20–29)
Creatinine, Ser: 0.7 mg/dL (ref 0.57–1.00)
GFR, EST AFRICAN AMERICAN: 103 mL/min/{1.73_m2} (ref >59)
GFR, EST NON AFRICAN AMERICAN: 89 mL/min/{1.73_m2} (ref >59)
Globulin, Total: 2.3 g/dL (ref 1.5–4.5)
Glucose: 85 mg/dL (ref 65–99)
Potassium: 4.2 mmol/L (ref 3.5–5.2)
Sodium: 141 mmol/L (ref 134–144)
TOTAL PROTEIN: 6.6 g/dL (ref 6.0–8.5)

## 2017-12-31 LAB — LIPID PANEL
CHOL/HDL RATIO: 4.5 ratio — AB (ref 0.0–4.4)
Cholesterol, Total: 268 mg/dL — ABNORMAL HIGH (ref 100–199)
HDL: 59 mg/dL (ref >39)
LDL CALC: 186 mg/dL — AB (ref 0–99)
Triglycerides: 113 mg/dL (ref 0–149)
VLDL Cholesterol Cal: 23 mg/dL (ref 5–40)

## 2017-12-31 MED ORDER — ALBUTEROL SULFATE HFA 108 (90 BASE) MCG/ACT IN AERS: 1.0000 | INHALATION_SPRAY | Freq: Four times a day (QID) | RESPIRATORY_TRACT | 2 refills | Status: DC | PRN

## 2017-12-31 NOTE — Progress Notes (Signed)
BP 116/69   Pulse 74   Temp 97.9 F (36.6 C) (Oral)   Ht 5' 3"  (1.6 m)   Wt 176 lb (79.8 kg)   BMI 31.18 kg/m    Subjective:    Patient ID: Erin Guerrero, female    DOB: September 23, 1950, 67 y.o.   MRN: [id number]  HPI: Erin Guerrero is a 67 y.o. female presenting on 12/31/2017 for Hypertension and COPD   HPI Hypertension Patient is currently on the irbesartan, today she just took it about 15 minutes before her appointment, and their blood pressure today is 116/69. Patient denies any lightheadedness or dizziness. Patient denies headaches, blurred vision, chest pains, shortness of breath, or weakness. Denies any side effects from medication and is content with current medication.   COPD Patient is coming in for COPD recheck today.  He is currently on Spiriva and albuterol.  He has a mild chronic cough but denies any major coughing spells or wheezing spells.  He has 0nighttime symptoms per week and 0daytime symptoms per week currently.  Patient was diagnosed with pulmonary nodule and is being followed by pulmonology for this, it shrunk on the last check so they are just monitoring for now.  She needs a refill of albuterol because her previous one is over 37 years old  Relevant past medical, surgical, family and social history reviewed and updated as indicated. Interim medical history since our last visit reviewed. Allergies and medications reviewed and updated.  Review of Systems  Constitutional: Negative for chills and fever.  HENT: Negative for congestion, ear discharge and ear pain.   Eyes: Negative for redness and visual disturbance.  Respiratory: Negative for chest tightness and shortness of breath.   Cardiovascular: Negative for chest pain and leg swelling.  Genitourinary: Negative for difficulty urinating and dysuria.  Musculoskeletal: Negative for arthralgias, back pain and gait problem.  Skin: Negative for rash.  Neurological: Negative for dizziness, light-headedness and  headaches.  Psychiatric/Behavioral: Negative for agitation and behavioral problems.  All other systems reviewed and are negative.   Per HPI unless specifically indicated above   Allergies as of 12/31/2017      Reactions   Diphenhydramine Hcl Anaphylaxis   Morphine Rash   Other   Other    Bleach - contact dermatitis   Statins Other (See Comments)   Muscle cramps/spasms   Benzocaine Rash   Latex Rash   Sulfa Antibiotics Rash   Muscle cramps.   Triclosan Rash      Medication List        Accurate as of 12/31/17  8:24 AM. Always use your most recent med list.          albuterol 108 (90 Base) MCG/ACT inhaler Commonly known as:  PROVENTIL HFA;VENTOLIN HFA Inhale 1-2 puffs into the lungs every 6 (six) hours as needed for wheezing or shortness of breath.   aspirin EC 81 MG tablet Take 81 mg by mouth daily.   CALCIUM 1200 PO Take 1,200 mg by mouth daily.   cetirizine 10 MG tablet Commonly known as:  ZYRTEC Take 10 mg by mouth daily.   cholecalciferol 1000 units tablet Commonly known as:  VITAMIN D Take 2,000 Units by mouth daily.   diazepam 5 MG tablet Commonly known as:  VALIUM One every 6 hours for muscle spasm   ibuprofen 800 MG tablet Commonly known as:  ADVIL,MOTRIN Take 800 mg by mouth every 8 (eight) hours as needed.   irbesartan 150 MG tablet Commonly known as:  AVAPRO Take 1 tablet (150 mg total) by mouth daily.   SPIRIVA RESPIMAT 2.5 MCG/ACT Aers Generic drug:  Tiotropium Bromide Monohydrate INHALE 2 PUFFS ONCE DAILY   triamcinolone 55 MCG/ACT Aero nasal inhaler Commonly known as:  NASACORT Place 2 sprays into the nose daily as needed (allergies).   TURMERIC CURCUMIN PO Take 1 tablet by mouth daily.          Objective:    BP 116/69   Pulse 74   Temp 97.9 F (36.6 C) (Oral)   Ht 5' 3"  (1.6 m)   Wt 176 lb (79.8 kg)   BMI 31.18 kg/m   Wt Readings from Last 3 Encounters:  12/31/17 176 lb (79.8 kg)  07/17/17 184 lb 3.2 oz (83.6 kg)    07/02/17 184 lb (83.5 kg)    Physical Exam  Constitutional: She is oriented to person, place, and time. She appears well-developed and well-nourished. No distress.  Eyes: Pupils are equal, round, and reactive to light. Conjunctivae and EOM are normal.  Neck: Neck supple. No thyromegaly present.  Cardiovascular: Normal rate, regular rhythm, normal heart sounds and intact distal pulses.  No murmur heard. Pulmonary/Chest: Effort normal and breath sounds normal. No respiratory distress. She has no wheezes.  Abdominal: Soft. Bowel sounds are normal. She exhibits no distension. There is no tenderness.  Musculoskeletal: Normal range of motion. She exhibits no edema.  Lymphadenopathy:    She has no cervical adenopathy.  Neurological: She is alert and oriented to person, place, and time. Coordination normal.  Skin: Skin is warm and dry. No rash noted. She is not diaphoretic.  Psychiatric: She has a normal mood and affect. Her behavior is normal.  Nursing note and vitals reviewed.       Assessment & Plan:   Problem List Items Addressed This Visit      Cardiovascular and Mediastinum   HTN (hypertension) - Primary   Relevant Orders   CMP14+EGFR (Completed)   Lipid panel (Completed)     Respiratory   COPD  GOLD III    Relevant Medications   albuterol (PROVENTIL HFA;VENTOLIN HFA) 108 (90 Base) MCG/ACT inhaler   Other Relevant Orders   CBC with Differential/Platelet (Completed)     Other   Obesity (BMI 30.0-34.9)    Other Visit Diagnoses    Lipid screening       Relevant Orders   Lipid panel (Completed)       Follow up plan: Return in about 1 year (around 01/01/2019), or if symptoms worsen or fail to improve, for Hypertension and COPD recheck.  Counseling provided for all of the vaccine components Orders Placed This Encounter  Procedures  . Pneumococcal conjugate vaccine 13-valent IM  . CBC with Differential/Platelet  . CMP14+EGFR  . Lipid panel    Caryl Pina,  MD [facility] 12/31/2017, 8:24 AM

## 2018-01-04 ENCOUNTER — Other Ambulatory Visit: Payer: Self-pay | Admitting: Family Medicine

## 2018-01-04 DIAGNOSIS — I10 Essential (primary) hypertension: Secondary | ICD-10-CM

## 2018-01-04 DIAGNOSIS — Z1231 Encounter for screening mammogram for malignant neoplasm of breast: Secondary | ICD-10-CM

## 2018-01-14 ENCOUNTER — Ambulatory Visit: Payer: Medicare Other | Admitting: Internal Medicine

## 2018-01-25 ENCOUNTER — Telehealth: Payer: Self-pay | Admitting: Family Medicine

## 2018-01-28 ENCOUNTER — Ambulatory Visit (HOSPITAL_COMMUNITY)
Admission: RE | Admit: 2018-01-28 | Discharge: 2018-01-28 | Disposition: A | Discharge status: Home / Self Care | Payer: Medicare Other | Source: Ambulatory Visit | Attending: Family Medicine | Admitting: Family Medicine

## 2018-01-28 DIAGNOSIS — Z1231 Encounter for screening mammogram for malignant neoplasm of breast: Secondary | ICD-10-CM

## 2018-01-29 ENCOUNTER — Encounter: Payer: Self-pay | Admitting: Internal Medicine

## 2018-01-29 ENCOUNTER — Ambulatory Visit (INDEPENDENT_AMBULATORY_CARE_PROVIDER_SITE_OTHER): Payer: Medicare Other | Admitting: Internal Medicine

## 2018-01-29 ENCOUNTER — Ambulatory Visit (INDEPENDENT_AMBULATORY_CARE_PROVIDER_SITE_OTHER)
Admission: RE | Admit: 2018-01-29 | Discharge: 2018-01-29 | Disposition: A | Discharge status: Home / Self Care | Payer: Medicare Other | Source: Ambulatory Visit | Attending: Internal Medicine | Admitting: Internal Medicine

## 2018-01-29 VITALS — BP 140/80 | HR 70 | Ht 63.0 in | Wt 178.6 lb

## 2018-01-29 DIAGNOSIS — R911 Solitary pulmonary nodule: Secondary | ICD-10-CM

## 2018-01-29 DIAGNOSIS — J449 Chronic obstructive pulmonary disease, unspecified: Secondary | ICD-10-CM

## 2018-01-29 DIAGNOSIS — R918 Other nonspecific abnormal finding of lung field: Secondary | ICD-10-CM | POA: Diagnosis not present

## 2018-01-29 NOTE — Progress Notes (Signed)
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 Initial evaluation reveals acute respiratory failure  Information is obtained from the patient. She states that 2 weeks ago she developed worsening shortness of breath fever chills headache. She saw a primary care provider tested positive for influenza was treated with Tamiflu for 10 days and completed a Z-Pak. She reports the "flulike symptoms" improved but the shortness of breath persisted and worsened. She was given Levaquin and is currently taking Levaquin. She denies chest pain palpitations cough fever chills headache syncope or near-syncope. She denies lower extremity edema but does endorse some orthopnea. She continues to smoke and has not been officially diagnosed with COPD but reports "chronic bronchitis". She has an inhaler that she uses on  occasion but here lately it has not helped. She also reportsabdominal pain non-radiating in epigastric area relieved with belching. sheendorses some nausea without vomiting. She denies dysuria hematuria frequency or urgency. She denies diarrhea constipation melena or bright red blood per rectum. He states she normally has a slight wheeze but over the last 2 days this has diminished."  Hospital Course:   Summary of her active problems in the hospital is as following. 1. Acute respiratory failure with hypoxia likely related to recent community-acquired pneumonia/influenza in the setting of chronic bronchitis in a tobacco user. Oxygen saturation level dropped to 70s in ER with ambulation on room air. Chest x-ray with no active cardiopulmonary disease but with nodule that appears stable.  She is afebrile no leukocytosis and dynamically stable and not hypoxic.  D-dimer when adjusted for age within limits of normal. We will continue regimen initiated with inhalers, steroids and antibiotics.  2. Pulmonary nodule. Discussed with pulmonary, patient will follow-up with outpatient for further workup. May be amenable to CT-guided biopsy.  3. Tachycardia. Sinus Due to inhalers. Monitor.  4., Abdominal pain/nausea. Resolved.  5. Tobacco use. -Cessation counseling offered     08/10/2016 1st [facility] office visit/ [name]  Re abn cxr  Chief Complaint  Patient presents with  . Pulmonary Consult    Referred by [facility] for eval of pulmonary nodule. Pt states she had the flu, and then was thought to have PNA and this is why CXR was done and then CT Chest. She states  her breathing is at baseline now and no co's today.   Completely back to nl but now on spiriva and prn saba  Doe = MMRC1 = can walk nl pace, flat grade, can't hurry or go uphills or steps s sob  rec Plan A = Automatic = spiriva each am  Plan B = Backup Only use your albuterol as a rescue medication  Congratulations  on not smoking, it's the most important aspect of your care    09/25/2016  f/u ov/[name] re:  GOLD III copd/ spiriva handhaler/ f/u SPN LUL   Chief Complaint  Patient presents with  . Follow-up    pt reports she is doing ok but started smoking a little bit, she is doing better no SOB, thinks Spiriva is helping  doe still MMRC =1  rec Please see patient coordinator before you leave today  to schedule PET scan and I will call you with the results In the meantime try to stop all smoking if possible  Start spiriva respimat 2.5  X  2 puffs each am  (one capsule of handihaler)       01/04/2017  f/u ov/[name] re: last cig x 3 m/ maint rx spiriva daily / rare saba  Chief Complaint  Patient presents with  . Follow-up    Pt has been doing good since last visit; no complaints or concerns. Denies any SOB, cough, or CP.   Doe still MMRC1 = can walk nl pace, flat grade, can't hurry or go uphills or steps s sob improved on spiriva   rec No change rx cxr on return   07/17/2017  f/u ov/[name] re:  Gold III copd/ LUL apical scarring p pna in [place] 01/2014 with neg pet 10/05/16  Chief Complaint  Patient presents with  . Follow-up    Breathing is doing well. She rarely uses her albuterol inhaler.   Dyspnea:  Still = MMRC 1 / better on spiriva than off  Cough: not an issue Sleep: flat  rec No change rx      01/29/2018  f/u ov/[name] re:   GOLD III copd / spiriva smi only  Chief Complaint  Patient presents with  . Follow-up    cxr repeated. Her breathing is unchanged. She rarely uses her rescue inhaler.  Dyspnea:  MMRC1 = can walk nl pace, flat grade, can't hurry or go uphills or steps s sob   Cough: none Sleeping: flat ok  SABA use: very rarely 02: none    No obvious day to day or daytime variability or assoc excess/ purulent sputum or mucus plugs or hemoptysis or cp or chest tightness, subjective wheeze or overt sinus or hb symptoms.   Sleeping as above  without nocturnal  or early am  exacerbation  of respiratory  c/o's or need for noct saba. Also denies any obvious fluctuation of symptoms with weather or environmental changes or other aggravating or alleviating factors except as outlined above   No unusual exposure hx or h/o childhood pna/ asthma or knowledge of premature birth.  Current Allergies, Complete Past Medical History, Past Surgical History, Family History, and Social History were reviewed in [employer] record.  ROS  The following are not active complaints unless bolded Hoarseness, sore throat, dysphagia, dental problems, itching, sneezing,  nasal congestion or discharge of excess mucus or purulent secretions, ear ache,   fever, chills, sweats, unintended wt loss or wt gain, classically pleuritic or exertional cp,  orthopnea pnd or arm/hand swelling  or leg swelling better at present presyncope, palpitations, abdominal pain, anorexia, nausea, vomiting, diarrhea  or change in bowel habits or change in bladder habits, change in stools or change in urine, dysuria, hematuria,  rash, arthralgias, visual complaints, headache, numbness, weakness or ataxia or problems with walking or coordination,  change in mood or  memory.        Current Meds  Medication Sig  . albuterol (PROVENTIL HFA;VENTOLIN HFA) 108 (90 Base) MCG/ACT inhaler Inhale 1-2 puffs into the lungs every 6 (six) hours as needed for wheezing or shortness of breath.  Marland Kitchen aspirin EC 81 MG tablet Take 81 mg by mouth daily.  . Calcium Carbonate-Vit D-Min (CALCIUM 1200 PO) Take 1,200 mg by mouth daily.  . cetirizine (ZYRTEC) 10 MG tablet Take 10 mg by mouth daily as needed.   . cholecalciferol (VITAMIN D) 1000 units tablet Take 2,000 Units by mouth daily.  . diazepam (VALIUM) 5 MG tablet One every 6 hours for muscle spasm  . ibuprofen (ADVIL,MOTRIN) 800 MG tablet Take 800 mg by mouth every 8 (eight) hours as needed.  . irbesartan (AVAPRO) 150 MG tablet TAKE 1 TABLET(150 MG) BY MOUTH DAILY  .  SPIRIVA RESPIMAT 2.5 MCG/ACT AERS INHALE 2 PUFFS ONCE DAILY  . triamcinolone (NASACORT) 55 MCG/ACT AERO nasal inhaler Place 2 sprays into the nose daily as needed (allergies).   . TURMERIC CURCUMIN PO Take 1 tablet by mouth daily.                         Objective:   Physical Exam   amb wf nad    01/29/2018        178  07/17/2017        184  01/04/2017        172  09/25/2016          168  08/10/16 159 lb 9.6 oz (72.4 kg)  08/05/16 155 lb 12.8 oz (70.7 kg)  03/16/16 170 lb (77.1 kg)          HEENT: nl dentition / oropharynx. Nl external ear canals without cough reflex -  Mild bilateral non-specific turbinate edema     NECK :  without JVD/Nodes/TM/ nl carotid upstrokes bilaterally   LUNGS: no acc muscle use,  Mild barrel  contour chest wall with bilateral  Distant bs s audible wheeze and  without cough on insp or exp maneuver and mild  Hyperresonant  to  percussion bilaterally     CV:  RRR  no s3 or murmur or increase in P2, and no edema   ABD:  soft and nontender with pos late insp Hoover's  in the supine position. No bruits or organomegaly appreciated, bowel sounds nl  MS:   Nl gait/  ext warm without deformities, calf tenderness, cyanosis or clubbing No obvious joint restrictions   SKIN: warm and dry without lesions    NEURO:  alert, approp, nl sensorium with  no motor or cerebellar deficits apparent.         CXR PA and Lateral:   01/29/2018 :    I personally reviewed images and agree with radiology impression as follows:   Stable nodular density is noted laterally in left upper lobe. Other pulmonary nodules described on prior CT and PET scans are not visualized radiographically.        Assessment:

## 2018-01-29 NOTE — Assessment & Plan Note (Addendum)
Quit smoking 07/20/16 - Spirometry 08/10/2016  FEV1 0.90 (39%)  Ratio 54 with classic curvature on spiriva handhaler  - 09/25/2016  After extensive coaching HFA effectiveness =    75% SMI so try spiriva respimat> improved    Doing much better on spiriva with no tendency to aecopd so no change rx   Discussed with pt: Formulary restrictions will be an ongoing challenge for the forseable future and I would be happy to pick an alternative if the pt will first  provide me a list of them but pt  will need to return here for training for any new device that is required eg dpi vs hfa vs respimat.    In meantime we can always provide samples so the patient never runs out of any needed respiratory medications.

## 2018-01-29 NOTE — Patient Instructions (Signed)
Please schedule a follow up visit in 12  months but call sooner if needed  

## 2018-01-29 NOTE — Assessment & Plan Note (Addendum)
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   Therefore f/u will be yearly, sooner if needed  > 50% of this 25 min spent in counseling

## 2018-02-12 ENCOUNTER — Encounter: Payer: Self-pay | Admitting: Physician Assistant

## 2018-02-12 ENCOUNTER — Ambulatory Visit (INDEPENDENT_AMBULATORY_CARE_PROVIDER_SITE_OTHER): Payer: Medicare Other | Admitting: Physician Assistant

## 2018-02-12 VITALS — BP 139/77 | HR 71 | Temp 97.3°F | Ht 63.0 in | Wt 176.0 lb

## 2018-02-12 DIAGNOSIS — T7840XA Allergy, unspecified, initial encounter: Secondary | ICD-10-CM | POA: Insufficient documentation

## 2018-02-12 DIAGNOSIS — Z888 Allergy status to other drugs, medicaments and biological substances status: Secondary | ICD-10-CM | POA: Diagnosis not present

## 2018-02-12 DIAGNOSIS — R21 Rash and other nonspecific skin eruption: Secondary | ICD-10-CM | POA: Diagnosis not present

## 2018-02-12 MED ORDER — CLOBETASOL PROPIONATE 0.05 % EX OINT: 1.0000 "application " | TOPICAL_OINTMENT | Freq: Two times a day (BID) | CUTANEOUS | 1 refills | Status: DC

## 2018-02-12 MED ORDER — METHYLPREDNISOLONE ACETATE 80 MG/ML IJ SUSP
80.0000 mg | Freq: Once | INTRAMUSCULAR | Status: AC
  Administered 2018-02-12: 80 mg via INTRAMUSCULAR

## 2018-02-13 NOTE — Progress Notes (Signed)
BP 139/77   Pulse 71   Temp (!) 97.3 F (36.3 C) (Oral)   Ht 5\' 3"  (1.6 m)   Wt 176 lb (79.8 kg)   BMI 31.18 kg/m    Subjective:    Patient ID: Erin Guerrero, female    DOB: [birth year], 67 y.o.   MRN: [id number]  HPI: Erin Guerrero is a 67 y.o. female presenting on 02/12/2018 for Allergic Reaction (hands blistering and peeling )  This patient comes in with a severe thickened itching rash on both hands.  Many years ago she had a very similar reaction to statins.  In recent weeks she has started red rice yeast and feels that this is related to the medication.  It has reactive the same with redness and blistering and peeling.  It is very painful and tight at this time and mild swelling.  She has stopped the red rice yeast.  Past Medical History:  Diagnosis Date  . Acute respiratory failure ([place])   . CAP (community acquired pneumonia)   . Cataract   . Chronic bronchitis ([place])   . COPD (chronic obstructive pulmonary disease) ([place])   . HTN (hypertension)   . Hyperlipidemia   . Influenza   . Osteoarthritis   . Pulmonary nodule   . Seasonal allergies   . Tobacco use    Relevant past medical, surgical, family and social history reviewed and updated as indicated. Interim medical history since our last visit reviewed. Allergies and medications reviewed and updated. DATA REVIEWED: CHART IN EPIC  Family History reviewed for pertinent findings.  Review of Systems  Constitutional: Negative.   HENT: Negative.   Eyes: Negative.   Respiratory: Negative.   Gastrointestinal: Negative.   Genitourinary: Negative.   Skin: Positive for color change, rash and wound.    Allergies as of 02/12/2018      Reactions   Diphenhydramine Hcl Anaphylaxis   Morphine Rash   Other   Statins Other (See Comments)   Muscle cramps/spasms, Severe skin rash    Red Yeast Rice [cholestin] Rash   Triclosan Rash   Other    Bleach - contact dermatitis   Benzocaine Rash   Latex Rash   Sulfa  Antibiotics Rash   Muscle cramps.      Medication List        Accurate as of 02/12/18 11:59 PM. Always use your most recent med list.          albuterol 108 (90 Base) MCG/ACT inhaler Commonly known as:  PROVENTIL HFA;VENTOLIN HFA Inhale 1-2 puffs into the lungs every 6 (six) hours as needed for wheezing or shortness of breath.   aspirin EC 81 MG tablet Take 81 mg by mouth daily.   CALCIUM 1200 PO Take 1,200 mg by mouth daily.   cetirizine 10 MG tablet Commonly known as:  ZYRTEC Take 10 mg by mouth daily as needed.   cholecalciferol 1000 units tablet Commonly known as:  VITAMIN D Take 2,000 Units by mouth daily.   clobetasol ointment 0.05 % Commonly known as:  TEMOVATE Apply 1 application topically 2 (two) times daily.   diazepam 5 MG tablet Commonly known as:  VALIUM One every 6 hours for muscle spasm   ibuprofen 800 MG tablet Commonly known as:  ADVIL,MOTRIN Take 800 mg by mouth every 8 (eight) hours as needed.   irbesartan 150 MG tablet Commonly known as:  AVAPRO TAKE 1 TABLET(150 MG) BY MOUTH DAILY   SPIRIVA RESPIMAT 2.5 MCG/ACT Aers  Generic drug:  Tiotropium Bromide Monohydrate INHALE 2 PUFFS ONCE DAILY   triamcinolone 55 MCG/ACT Aero nasal inhaler Commonly known as:  NASACORT Place 2 sprays into the nose daily as needed (allergies).   TURMERIC CURCUMIN PO Take 1 tablet by mouth daily.          Objective:    BP 139/77   Pulse 71   Temp (!) 97.3 F (36.3 C) (Oral)   Ht 5\' 3"  (1.6 m)   Wt 176 lb (79.8 kg)   BMI 31.18 kg/m   Allergies  Allergen Reactions  . Diphenhydramine Hcl Anaphylaxis  . Morphine Rash    Other  . Statins Other (See Comments)    Muscle cramps/spasms, Severe skin rash   . Red Yeast Rice [Cholestin] Rash  . Triclosan Rash  . Other     Bleach - contact dermatitis  . Benzocaine Rash  . Latex Rash  . Sulfa Antibiotics Rash    Muscle cramps.    Wt Readings from Last 3 Encounters:  02/12/18 176 lb (79.8 kg)    01/29/18 178 lb 9.6 oz (81 kg)  12/31/17 176 lb (79.8 kg)    Physical Exam  Constitutional: She is oriented to person, place, and time. She appears well-developed and well-nourished.  HENT:  Head: Normocephalic and atraumatic.  Eyes: Pupils are equal, round, and reactive to light. Conjunctivae and EOM are normal.  Cardiovascular: Normal rate, regular rhythm, normal heart sounds and intact distal pulses.  Pulmonary/Chest: Effort normal and breath sounds normal.  Abdominal: Soft. Bowel sounds are normal.  Neurological: She is alert and oriented to person, place, and time. She has normal reflexes.  Skin: Skin is warm and dry. Lesion and rash noted. Rash is macular and papular. There is erythema.     Bilateral hands with confluent redness on the front and backs of the hands.  There is evidence of peeling and scaling throughout her hands.  She is very tight across her knuckles even some skin cracking in those areas.  Psychiatric: She has a normal mood and affect. Her behavior is normal. Judgment and thought content normal.        Assessment & Plan:   1. Allergic reaction to drug, initial encounter - methylPREDNISolone acetate (DEPO-MEDROL) injection 80 mg - clobetasol ointment (TEMOVATE) 0.05 %; Apply 1 application topically 2 (two) times daily.  Dispense: 60 g; Refill: 1   Continue all other maintenance medications as listed above.  Follow up plan: No follow-ups on file.  Educational handout given for [place] PA-C [facility] [address]  [place], [place] [address] [phone]   02/13/2018, 11:52 AM

## 2018-03-01 DIAGNOSIS — Z23 Encounter for immunization: Secondary | ICD-10-CM | POA: Diagnosis not present

## 2018-03-01 IMAGING — PT NM PET TUM IMG INITIAL (PI) SKULL BASE T - THIGH
1 of 8 series · 1 of 25 positions shown · non-contrast
Comparison: Chest CT on 08/01/2016

CLINICAL DATA: Initial treatment strategy for left upper lobe
pulmonary nodule.

EXAM:
NUCLEAR MEDICINE PET SKULL BASE TO THIGH
TECHNIQUE: 8.5 mCi F-18 FDG was injected intravenously. Full-ring PET imaging
was performed from the skull base to thigh after the radiotracer. CT
data was obtained and used for attenuation correction and anatomic
localization.
FASTING BLOOD GLUCOSE:  Value: 103 mg/dl

[Series 4: ct sk_thigh 5.0 hd_fov · axial · 5.0mm · 1.13mm/px · 1 of 204 slices shown]
[im 204/204  brain]
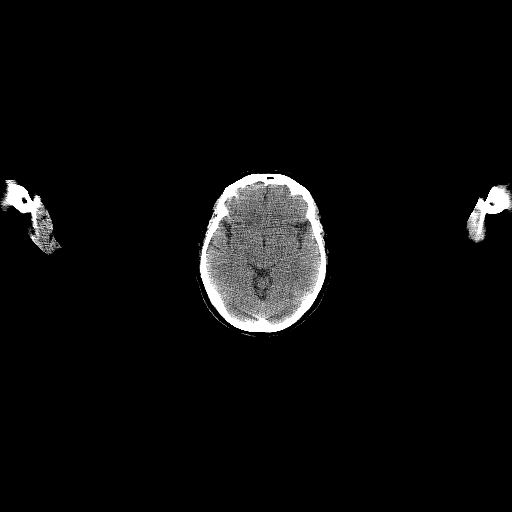

[1 of 25 positions shown; findings below may reference images not displayed]

FINDINGS: NECK

No hypermetabolic lymph nodes in the neck.

CHEST

No hypermetabolic mediastinal, hilar, or axillary lymph nodes.

1.8 cm well-circumscribed pulmonary nodule in the lateral left upper
lobe shows low-grade FDG uptake, with SUV max of 1.4. Adjacent
cluster of tiny less than 5 mm nodules in the posterior left lower
lobe remains stable. A few tiny scattered less than 5 mm right upper
lobe pulmonary nodules also noted. These show no FDG uptake and are
likely postinflammatory, but are too small to characterize by PET.

Mild right middle lobe scarring and bronchiectasis. No evidence of
pulmonary consolidation or pleural effusion.

ABDOMEN/PELVIS

No abnormal hypermetabolic activity within the liver, pancreas,
adrenal glands, or spleen. No hypermetabolic lymph nodes in the
abdomen or pelvis.

Stable small low-attenuation bilateral adrenal masses are seen which
showed no significant FDG uptake are consistent with benign adrenal
adenomas. Tiny left hepatic lobe cysts and prior cholecystectomy
noted. Aortic atherosclerosis.

SKELETON

No focal hypermetabolic activity to suggest skeletal metastasis.
IMPRESSION: 1.8 cm well-circumscribed left upper lobe pulmonary nodule shows
low-grade FDG uptake with SUV max of 1.4. Differential diagnosis
includes postinflammatory etiology as well as carcinoid tumor, and
less likely low-grade carcinoma. Management options include
continued followup by chest CT in 6 months or percutaneous needle
biopsy.

Other tiny less than 5 mm indeterminate pulmonary nodules in both
upper lobes, which are too small to characterize by PET. Continued
attention is recommended on follow-up CT.

Small benign bilateral adrenal adenomas.

## 2018-05-06 ENCOUNTER — Other Ambulatory Visit: Payer: Self-pay | Admitting: Internal Medicine

## 2018-05-06 DIAGNOSIS — J449 Chronic obstructive pulmonary disease, unspecified: Secondary | ICD-10-CM

## 2018-07-02 ENCOUNTER — Other Ambulatory Visit: Payer: Self-pay | Admitting: Family Medicine

## 2018-07-02 DIAGNOSIS — I10 Essential (primary) hypertension: Secondary | ICD-10-CM

## 2018-07-03 NOTE — Telephone Encounter (Signed)
Ov 12/31/17 rtc 1 yr

## 2019-01-02 ENCOUNTER — Other Ambulatory Visit (HOSPITAL_COMMUNITY): Payer: Self-pay | Admitting: Family Medicine

## 2019-01-02 ENCOUNTER — Encounter: Payer: Self-pay | Admitting: Family Medicine

## 2019-01-02 ENCOUNTER — Ambulatory Visit (INDEPENDENT_AMBULATORY_CARE_PROVIDER_SITE_OTHER): Payer: Medicare Other | Admitting: Family Medicine

## 2019-01-02 DIAGNOSIS — I10 Essential (primary) hypertension: Secondary | ICD-10-CM | POA: Diagnosis not present

## 2019-01-02 DIAGNOSIS — Z13220 Encounter for screening for lipoid disorders: Secondary | ICD-10-CM | POA: Diagnosis not present

## 2019-01-02 DIAGNOSIS — J449 Chronic obstructive pulmonary disease, unspecified: Secondary | ICD-10-CM | POA: Diagnosis not present

## 2019-01-02 DIAGNOSIS — Z1231 Encounter for screening mammogram for malignant neoplasm of breast: Secondary | ICD-10-CM

## 2019-01-02 MED ORDER — SPIRIVA RESPIMAT 2.5 MCG/ACT IN AERS: 1.0000 | INHALATION_SPRAY | Freq: Every day | RESPIRATORY_TRACT | 11 refills | Status: DC

## 2019-01-02 MED ORDER — IRBESARTAN 150 MG PO TABS: 150.0000 mg | ORAL_TABLET | Freq: Every day | ORAL | 3 refills | Status: DC

## 2019-01-02 MED ORDER — ALBUTEROL SULFATE HFA 108 (90 BASE) MCG/ACT IN AERS: 1.0000 | INHALATION_SPRAY | Freq: Four times a day (QID) | RESPIRATORY_TRACT | 2 refills | Status: DC | PRN

## 2019-01-02 NOTE — Progress Notes (Signed)
Virtual Visit via telephone Note  I connected with Erin Guerrero on 01/02/19 at [place] by telephone and verified that I am speaking with the correct person using two identifiers. Erin Guerrero is currently located at home and no other people are currently with her during visit. The provider, Fransisca Kaufmann Dettinger, MD is located in their office at time of visit.  Call ended at [place]  I discussed the limitations, risks, security and privacy concerns of performing an evaluation and management service by telephone and the availability of in person appointments. I also discussed with the patient that there may be a patient responsible charge related to this service. The patient expressed understanding and agreed to proceed.   History and Present Illness: Hypertension Patient is currently on irbesartan, and their blood pressure today is 140/70-130-140s. Patient denies any lightheadedness or dizziness. Patient denies headaches, blurred vision, chest pains, shortness of breath, or weakness. Denies any side effects from medication and is content with current medication.   No diagnosis found.  Outpatient Encounter Medications as of 01/02/2019  Medication Sig  . albuterol (PROVENTIL HFA;VENTOLIN HFA) 108 (90 Base) MCG/ACT inhaler Inhale 1-2 puffs into the lungs every 6 (six) hours as needed for wheezing or shortness of breath.  Marland Kitchen aspirin EC 81 MG tablet Take 81 mg by mouth daily.  . Calcium Carbonate-Vit D-Min (CALCIUM 1200 PO) Take 1,200 mg by mouth daily.  . cetirizine (ZYRTEC) 10 MG tablet Take 10 mg by mouth daily as needed.   . cholecalciferol (VITAMIN D) 1000 units tablet Take 2,000 Units by mouth daily.  . clobetasol ointment (TEMOVATE) 9.48 % Apply 1 application topically 2 (two) times daily.  . diazepam (VALIUM) 5 MG tablet One every 6 hours for muscle spasm  . ibuprofen (ADVIL,MOTRIN) 800 MG tablet Take 800 mg by mouth every 8 (eight) hours as needed.  . irbesartan (AVAPRO) 150 MG tablet  TAKE 1 TABLET(150 MG) BY MOUTH DAILY  . SPIRIVA RESPIMAT 2.5 MCG/ACT AERS INHALE 2 PUFFS ONCE DAILY  . triamcinolone (NASACORT) 55 MCG/ACT AERO nasal inhaler Place 2 sprays into the nose daily as needed (allergies).   . TURMERIC CURCUMIN PO Take 1 tablet by mouth daily.   No facility-administered encounter medications on file as of 01/02/2019.     Review of Systems  Constitutional: Negative for chills and fever.  Eyes: Negative for visual disturbance.  Respiratory: Negative for chest tightness and shortness of breath.   Cardiovascular: Negative for chest pain and leg swelling.  Musculoskeletal: Negative for back pain and gait problem.  Skin: Negative for rash.  Neurological: Negative for light-headedness and headaches.  Psychiatric/Behavioral: Negative for agitation and behavioral problems.  All other systems reviewed and are negative.   Observations/Objective: Patient sounds comfortable and in no acute distress   Assessment and Plan: Problem List Items Addressed This Visit      Cardiovascular and Mediastinum   HTN (hypertension)   Relevant Medications   irbesartan (AVAPRO) 150 MG tablet   Other Relevant Orders   CBC with Differential/Platelet   CMP14+EGFR   Lipid panel     Respiratory   COPD  GOLD III  - Primary   Relevant Medications   Tiotropium Bromide Monohydrate (SPIRIVA RESPIMAT) 2.5 MCG/ACT AERS   albuterol (VENTOLIN HFA) 108 (90 Base) MCG/ACT inhaler   Other Relevant Orders   DG Chest 2 View   CBC with Differential/Platelet    Other Visit Diagnoses    Lipid screening       Relevant Orders  Lipid panel       Follow Up Instructions: Follow up in 1 year    I discussed the assessment and treatment plan with the patient. The patient was provided an opportunity to ask questions and all were answered. The patient agreed with the plan and demonstrated an understanding of the instructions.   The patient was advised to call back or seek an in-person evaluation  if the symptoms worsen or if the condition fails to improve as anticipated.  The above assessment and management plan was discussed with the patient. The patient verbalized understanding of and has agreed to the management plan. Patient is aware to call the clinic if symptoms persist or worsen. Patient is aware when to return to the clinic for a follow-up visit. Patient educated on when it is appropriate to go to the emergency department.    I provided 13 minutes of non-face-to-face time during this encounter.    Worthy Rancher, MD

## 2019-01-03 ENCOUNTER — Other Ambulatory Visit: Payer: Medicare Other

## 2019-01-03 ENCOUNTER — Ambulatory Visit (HOSPITAL_COMMUNITY)
Admission: RE | Admit: 2019-01-03 | Discharge: 2019-01-03 | Disposition: A | Discharge status: Home / Self Care | Payer: Medicare Other | Source: Ambulatory Visit | Attending: Family Medicine | Admitting: Family Medicine

## 2019-01-03 ENCOUNTER — Other Ambulatory Visit (HOSPITAL_COMMUNITY)
Admission: RE | Admit: 2019-01-03 | Discharge: 2019-01-03 | Disposition: A | Discharge status: Home / Self Care | Payer: Medicare Other | Source: Ambulatory Visit | Attending: Family Medicine | Admitting: Family Medicine

## 2019-01-03 ENCOUNTER — Other Ambulatory Visit: Payer: Self-pay

## 2019-01-03 DIAGNOSIS — R911 Solitary pulmonary nodule: Secondary | ICD-10-CM | POA: Diagnosis not present

## 2019-01-03 DIAGNOSIS — J449 Chronic obstructive pulmonary disease, unspecified: Secondary | ICD-10-CM

## 2019-01-03 LAB — COMPREHENSIVE METABOLIC PANEL
ALT: 31 U/L (ref 0–44)
AST: 20 U/L (ref 15–41)
Albumin: 4.3 g/dL (ref 3.5–5.0)
Alkaline Phosphatase: 76 U/L (ref 38–126)
Anion gap: 9 (ref 5–15)
BUN: 17 mg/dL (ref 8–23)
CO2: 26 mmol/L (ref 22–32)
Calcium: 9.6 mg/dL (ref 8.9–10.3)
Chloride: 104 mmol/L (ref 98–111)
Creatinine, Ser: 0.63 mg/dL (ref 0.44–1.00)
GFR calc Af Amer: >60 mL/min (ref >60)
GFR calc non Af Amer: >60 mL/min (ref >60)
Glucose, Bld: 91 mg/dL (ref 70–99)
Potassium: 4.1 mmol/L (ref 3.5–5.1)
Sodium: 139 mmol/L (ref 135–145)
Total Bilirubin: 0.8 mg/dL (ref 0.3–1.2)
Total Protein: 7.1 g/dL (ref 6.5–8.1)

## 2019-01-03 LAB — CBC WITH DIFFERENTIAL/PLATELET
Abs Immature Granulocytes: 0.02 10*3/uL (ref 0.00–0.07)
Basophils Absolute: 0 10*3/uL (ref 0.0–0.1)
Basophils Relative: 0 %
Eosinophils Absolute: 0.2 10*3/uL (ref 0.0–0.5)
Eosinophils Relative: 3 %
HCT: 45.8 % (ref 36.0–46.0)
Hemoglobin: 15.2 g/dL — ABNORMAL HIGH (ref 12.0–15.0)
Immature Granulocytes: 0 %
Lymphocytes Relative: 29 %
Lymphs Abs: 2.1 10*3/uL (ref 0.7–4.0)
MCH: 31.4 pg (ref 26.0–34.0)
MCHC: 33.2 g/dL (ref 30.0–36.0)
MCV: 94.6 fL (ref 80.0–100.0)
Monocytes Absolute: 0.8 10*3/uL (ref 0.1–1.0)
Monocytes Relative: 11 %
Neutro Abs: 4.2 10*3/uL (ref 1.7–7.7)
Neutrophils Relative %: 57 %
Platelets: 176 10*3/uL (ref 150–400)
RBC: 4.84 MIL/uL (ref 3.87–5.11)
RDW: 11.8 % (ref 11.5–15.5)
WBC: 7.3 10*3/uL (ref 4.0–10.5)
nRBC: 0 % (ref 0.0–0.2)

## 2019-01-24 ENCOUNTER — Other Ambulatory Visit: Payer: Self-pay | Admitting: *Deleted

## 2019-01-30 ENCOUNTER — Ambulatory Visit: Payer: Medicare Other | Admitting: Internal Medicine

## 2019-02-03 DIAGNOSIS — Z23 Encounter for immunization: Secondary | ICD-10-CM | POA: Diagnosis not present

## 2019-02-04 ENCOUNTER — Telehealth: Payer: Self-pay | Admitting: Family Medicine

## 2019-02-04 MED ORDER — DIAZEPAM 5 MG PO TABS: ORAL_TABLET | ORAL | 0 refills | Status: DC

## 2019-02-04 NOTE — Telephone Encounter (Signed)
Sent refill for patient, she needs to try make sure she gets this during the visit in the future.

## 2019-02-06 ENCOUNTER — Ambulatory Visit (HOSPITAL_COMMUNITY): Payer: Medicare Other

## 2019-02-12 ENCOUNTER — Ambulatory Visit (HOSPITAL_COMMUNITY)
Admission: RE | Admit: 2019-02-12 | Discharge: 2019-02-12 | Disposition: A | Discharge status: Home / Self Care | Payer: Medicare Other | Source: Ambulatory Visit | Attending: Family Medicine | Admitting: Family Medicine

## 2019-02-12 ENCOUNTER — Other Ambulatory Visit: Payer: Self-pay

## 2019-02-12 DIAGNOSIS — Z1231 Encounter for screening mammogram for malignant neoplasm of breast: Secondary | ICD-10-CM

## 2019-05-14 ENCOUNTER — Other Ambulatory Visit: Payer: Self-pay

## 2019-06-27 ENCOUNTER — Other Ambulatory Visit: Payer: Self-pay | Admitting: Family Medicine

## 2019-06-27 DIAGNOSIS — J449 Chronic obstructive pulmonary disease, unspecified: Secondary | ICD-10-CM

## 2019-06-27 DIAGNOSIS — T7840XA Allergy, unspecified, initial encounter: Secondary | ICD-10-CM

## 2019-06-27 DIAGNOSIS — I10 Essential (primary) hypertension: Secondary | ICD-10-CM

## 2019-06-27 MED ORDER — SPIRIVA RESPIMAT 2.5 MCG/ACT IN AERS: 1.0000 | INHALATION_SPRAY | Freq: Every day | RESPIRATORY_TRACT | 5 refills | Status: DC

## 2019-06-27 MED ORDER — CLOBETASOL PROPIONATE 0.05 % EX OINT: 1.0000 "application " | TOPICAL_OINTMENT | Freq: Two times a day (BID) | CUTANEOUS | 1 refills | Status: DC

## 2019-06-27 MED ORDER — IRBESARTAN 150 MG PO TABS: 150.0000 mg | ORAL_TABLET | Freq: Every day | ORAL | 1 refills | Status: DC

## 2019-06-27 MED ORDER — ALBUTEROL SULFATE HFA 108 (90 BASE) MCG/ACT IN AERS: 1.0000 | INHALATION_SPRAY | Freq: Four times a day (QID) | RESPIRATORY_TRACT | 0 refills | Status: DC | PRN

## 2019-06-27 NOTE — Telephone Encounter (Signed)
What is the name of the medication? Tiotropium Bromide Monohydrate (SPIRIVA RESPIMAT) 2.5 MCG/ACT AERS  irbesartan (AVAPRO) 150 MG tablet albuterol (VENTOLIN HFA) 108 (90 Base) MCG/ACT inhaler  clobetasol ointment (TEMOVATE) 0.05 %   Have you contacted your pharmacy to request a refill? yes  Which pharmacy would you like this sent to? [facility] please fax to [phone]   Patient notified that their request is being sent to the clinical staff for review and that they should receive a call once it is complete. If they do not receive a call within 24 hours they can check with their pharmacy or our office.

## 2019-07-01 ENCOUNTER — Telehealth: Payer: Self-pay | Admitting: Family Medicine

## 2019-07-01 DIAGNOSIS — J449 Chronic obstructive pulmonary disease, unspecified: Secondary | ICD-10-CM

## 2019-07-01 NOTE — Telephone Encounter (Signed)
Please advise 

## 2019-07-02 MED ORDER — SPIRIVA RESPIMAT 2.5 MCG/ACT IN AERS: 1.0000 | INHALATION_SPRAY | Freq: Two times a day (BID) | RESPIRATORY_TRACT | 5 refills | Status: DC

## 2019-07-02 NOTE — Telephone Encounter (Signed)
I change the Spiriva and sent it for her

## 2019-07-02 NOTE — Telephone Encounter (Signed)
Lmtcb.

## 2019-07-04 NOTE — Telephone Encounter (Signed)
Patient aware, per message left on voice mail,   script is ready. 

## 2019-07-15 DIAGNOSIS — H524 Presbyopia: Secondary | ICD-10-CM | POA: Diagnosis not present

## 2019-07-15 DIAGNOSIS — H2513 Age-related nuclear cataract, bilateral: Secondary | ICD-10-CM | POA: Diagnosis not present

## 2019-08-09 ENCOUNTER — Ambulatory Visit: Payer: Medicare Other | Attending: Internal Medicine

## 2019-08-09 DIAGNOSIS — Z23 Encounter for immunization: Secondary | ICD-10-CM | POA: Insufficient documentation

## 2019-08-09 NOTE — Progress Notes (Signed)
   Covid-19 Vaccination Clinic  Name:  Erin Guerrero    MRN: [id number] DOB: 05/12/1951  08/09/2019  [name] was observed post Covid-19 immunization for 30 minutes based on pre-vaccination screening without incidence. She was provided with Vaccine Information Sheet and instruction to access the V-Safe system.   [name] was instructed to call 911 with any severe reactions post vaccine: Marland Kitchen Difficulty breathing  . Swelling of your face and throat  . A fast heartbeat  . A bad rash all over your body  . Dizziness and weakness    Immunizations Administered    Name Date Dose VIS Date Route   Pfizer COVID-19 Vaccine 08/09/2019 12:44 PM 0.3 mL 05/30/2019 Intramuscular   Manufacturer: [place]   Lot: [id number]   [place]: [id number]

## 2019-09-01 ENCOUNTER — Telehealth: Payer: Self-pay | Admitting: *Deleted

## 2019-09-01 ENCOUNTER — Telehealth: Payer: Self-pay | Admitting: Family Medicine

## 2019-09-01 DIAGNOSIS — I10 Essential (primary) hypertension: Secondary | ICD-10-CM

## 2019-09-01 DIAGNOSIS — J449 Chronic obstructive pulmonary disease, unspecified: Secondary | ICD-10-CM

## 2019-09-01 MED ORDER — IRBESARTAN 150 MG PO TABS: 150.0000 mg | ORAL_TABLET | Freq: Every day | ORAL | 1 refills | Status: DC

## 2019-09-01 MED ORDER — SPIRIVA RESPIMAT 2.5 MCG/ACT IN AERS: 2.0000 | INHALATION_SPRAY | Freq: Two times a day (BID) | RESPIRATORY_TRACT | 5 refills | Status: DC

## 2019-09-01 NOTE — Telephone Encounter (Signed)
I sent refills with changes

## 2019-09-01 NOTE — Telephone Encounter (Signed)
Prior auth for Spiriva Respimat 2.5MCG/ACT aerosol- In Process   ([id number])   [facility] is reviewing your PA request. Typically an electronic response will be received within 72 hours. To check for an update later, open this request from your dashboard.  You may close this dialog and return to your dashboard to perform other tasks.

## 2019-09-01 NOTE — Telephone Encounter (Signed)
  Medication Request  09/01/2019  What is the name of the medication? Tiotropium Bromide Monohydrate (SPIRIVA RESPIMAT) 2.5 MCG/ACT AERS  irbesartan (AVAPRO) 150 MG tablet   Have you contacted your pharmacy to request a refill? Yes, they need office to call in because they have 1 puff and it needs to be 2 puffs on her Spiriva  Which pharmacy would you like this sent to? [facility]   Patient notified that their request is being sent to the clinical staff for review and that they should receive a call once it is complete. If they do not receive a call within 24 hours they can check with their pharmacy or our office.

## 2019-09-01 NOTE — Telephone Encounter (Signed)
Please advise if script needs to be changed.

## 2019-09-02 ENCOUNTER — Ambulatory Visit: Payer: Medicare Other

## 2019-09-02 NOTE — Telephone Encounter (Signed)
DENIED  Does not meet quantity limit. Limit is 4 grams per 30 days. Was prescribed 4 grams per 15 days.

## 2019-09-03 MED ORDER — SPIRIVA RESPIMAT 2.5 MCG/ACT IN AERS: 2.0000 | INHALATION_SPRAY | Freq: Two times a day (BID) | RESPIRATORY_TRACT | 5 refills | Status: DC

## 2019-09-03 NOTE — Telephone Encounter (Signed)
I believe I fixed the quantity limit to 8 g.  Per 30 days.

## 2019-09-03 NOTE — Telephone Encounter (Signed)
Called pharmacy it was filled 03/16 and patient has already picked up.

## 2019-09-13 ENCOUNTER — Ambulatory Visit: Payer: Medicare Other | Attending: Internal Medicine

## 2019-09-13 DIAGNOSIS — Z23 Encounter for immunization: Secondary | ICD-10-CM

## 2019-09-13 NOTE — Progress Notes (Signed)
   Covid-19 Vaccination Clinic  Name:  Erin Guerrero    MRN: [id number] DOB: Jan 24, 1951  09/13/2019  Erin Guerrero was observed post Covid-19 immunization for 15 minutes without incident. She was provided with Vaccine Information Sheet and instruction to access the V-Safe system.   Erin Guerrero was instructed to call 911 with any severe reactions post vaccine: Marland Kitchen Difficulty breathing  . Swelling of face and throat  . A fast heartbeat  . A bad rash all over body  . Dizziness and weakness   Immunizations Administered    Name Date Dose VIS Date Route   Pfizer COVID-19 Vaccine 09/13/2019  2:15 PM 0.3 mL 05/30/2019 Intramuscular   Manufacturer: [place]   Lot: [id number]   [place]: [id number]

## 2019-10-02 ENCOUNTER — Other Ambulatory Visit: Payer: Self-pay | Admitting: Family Medicine

## 2019-10-02 DIAGNOSIS — T7840XA Allergy, unspecified, initial encounter: Secondary | ICD-10-CM

## 2019-10-07 ENCOUNTER — Telehealth: Payer: Self-pay | Admitting: Family Medicine

## 2019-10-07 NOTE — Chronic Care Management (AMB) (Signed)
  Chronic Care Management   Note  10/07/2019 Name: MARYLOUISE MALLET MRN: [id number] DOB: [birth year]  KEYOSHA TIEDT is a 69 y.o. year old female who is a primary care patient of Dettinger, Fransisca Kaufmann, MD. I reached out to [name] by phone today in response to a referral sent by Ms. [place]nie [place]'s health plan.     Ms. Axley was given information about Chronic Care Management services today including:  1. CCM service includes personalized support from designated clinical staff supervised by her physician, including individualized plan of care and coordination with other care providers 2. 24/7 contact phone numbers for assistance for urgent and routine care needs. 3. Service will only be billed when office clinical staff spend 20 minutes or more in a month to coordinate care. 4. Only one practitioner may furnish and bill the service in a calendar month. 5. The patient may stop CCM services at any time (effective at the end of the month) by phone call to the office staff. 6. The patient will be responsible for cost sharing (co-pay) of up to 20% of the service fee (after annual deductible is met).  Patient did not agree to enrollment in care management services and does not wish to consider at this time.  Follow up plan: The patient has been provided with contact information for the care management team and has been advised to call with any health related questions or concerns.   Noreene Larsson, [place], [place], [place] [address] Direct Dial: [phone] [email or web address] Website: [email or web address]

## 2019-11-23 ENCOUNTER — Other Ambulatory Visit: Payer: Self-pay | Admitting: Family Medicine

## 2019-11-23 DIAGNOSIS — I10 Essential (primary) hypertension: Secondary | ICD-10-CM

## 2020-01-02 ENCOUNTER — Encounter: Payer: Self-pay | Admitting: Family Medicine

## 2020-01-02 ENCOUNTER — Ambulatory Visit (INDEPENDENT_AMBULATORY_CARE_PROVIDER_SITE_OTHER): Payer: Medicare Other | Admitting: Family Medicine

## 2020-01-02 ENCOUNTER — Other Ambulatory Visit: Payer: Self-pay

## 2020-01-02 VITALS — BP 143/80 | HR 75 | Temp 98.6°F | Ht 63.0 in | Wt 176.0 lb

## 2020-01-02 DIAGNOSIS — F419 Anxiety disorder, unspecified: Secondary | ICD-10-CM | POA: Diagnosis not present

## 2020-01-02 DIAGNOSIS — Z780 Asymptomatic menopausal state: Secondary | ICD-10-CM | POA: Diagnosis not present

## 2020-01-02 DIAGNOSIS — Z79891 Long term (current) use of opiate analgesic: Secondary | ICD-10-CM | POA: Diagnosis not present

## 2020-01-02 DIAGNOSIS — E669 Obesity, unspecified: Secondary | ICD-10-CM

## 2020-01-02 DIAGNOSIS — J449 Chronic obstructive pulmonary disease, unspecified: Secondary | ICD-10-CM | POA: Diagnosis not present

## 2020-01-02 DIAGNOSIS — I10 Essential (primary) hypertension: Secondary | ICD-10-CM

## 2020-01-02 MED ORDER — DIAZEPAM 5 MG PO TABS: ORAL_TABLET | ORAL | 0 refills | Status: DC

## 2020-01-02 MED ORDER — IRBESARTAN 150 MG PO TABS: 150.0000 mg | ORAL_TABLET | Freq: Every day | ORAL | 3 refills | Status: DC

## 2020-01-02 NOTE — Progress Notes (Signed)
BP (!) 143/80   Pulse 75   Temp 98.6 F (37 C)   Ht '5\' 3"'$  (1.6 m)   Wt 176 lb (79.8 kg)   BMI 31.18 kg/m    Subjective:   Patient ID: Erin Guerrero, female    DOB: 02-22-1951, 69 y.o.   MRN: [id number]  HPI: Erin Guerrero is a 69 y.o. female presenting on 01/02/2020 for Medical Management of Chronic Issues and Hypertension   HPI Hypertension Patient is currently on irbesartan, and their blood pressure today is 143/80. Patient denies any lightheadedness or dizziness. Patient denies headaches, blurred vision, chest pains, shortness of breath, or weakness. Denies any side effects from medication and is content with current medication.   COPD Patient is coming in for COPD recheck today.  He is currently on Spiriva.  He has a mild chronic cough but denies any major coughing spells or wheezing spells.  He has 0nighttime symptoms per week and 0daytime symptoms per week currently.   Anxiety Patient is coming in for anxiety recheck.  She uses diazepam very occasionally and had 20 tablets that were filled a year ago. Current rx-Valium 5 mg daily as needed # meds rx-20 tablets Effectiveness of current meds-works well, only has to use it sporadically Adverse reactions form meds-none  Pill count performed-No Last drug screen -N/A ( high risk q55m moderate risk q625mlow risk yearly ) Urine drug screen today- Yes [place]  If yes were their any concerning findings? -None  No flowsheet data found.   Controlled substance contract signed on: 01/02/2020  Relevant past medical, surgical, family and social history reviewed and updated as indicated. Interim medical history since our last visit reviewed. Allergies and medications reviewed and updated.  Review of Systems  Constitutional: Negative for chills and fever.  Eyes: Negative for visual disturbance.  Respiratory: Negative for chest tightness and shortness of breath.   Cardiovascular: Negative for chest pain  and leg swelling.  Musculoskeletal: Negative for back pain and gait problem.  Skin: Negative for rash.  Neurological: Negative for light-headedness and headaches.  Psychiatric/Behavioral: Negative for agitation, behavioral problems, dysphoric mood, self-injury, sleep disturbance and suicidal ideas. The patient is nervous/anxious.   All other systems reviewed and are negative.   Per HPI unless specifically indicated above   Allergies as of 01/02/2020      Reactions   Diphenhydramine Hcl Anaphylaxis   Morphine Rash   Other   Statins Other (See Comments)   Muscle cramps/spasms, Severe skin rash    Red Yeast Rice [cholestin] Rash   Triclosan Rash   Other    Bleach - contact dermatitis   Benzocaine Rash   Latex Rash   Sulfa Antibiotics Rash   Muscle cramps.      Medication List       Accurate as of January 02, 2020 11:11 AM. If you have any questions, ask your nurse or doctor.        albuterol 108 (90 Base) MCG/ACT inhaler Commonly known as: VENTOLIN HFA Inhale 1-2 puffs into the lungs every 6 (six) hours as needed for wheezing or shortness of breath.   aspirin EC 81 MG tablet Take 81 mg by mouth daily.   CALCIUM 1200 PO Take 1,200 mg by mouth daily.   cetirizine 10 MG tablet Commonly known as: ZYRTEC Take 10 mg by mouth daily as needed.   cholecalciferol 1000 units tablet Commonly known as: VITAMIN D Take 2,000 Units by mouth daily.   clobetasol  ointment 0.05 % Commonly known as: TEMOVATE APPLY EXTERNALLY TO THE AFFECTED AREA TWICE DAILY   diazepam 5 MG tablet Commonly known as: Valium One every 6 hours for muscle spasm   ibuprofen 800 MG tablet Commonly known as: ADVIL Take 800 mg by mouth every 8 (eight) hours as needed.   irbesartan 150 MG tablet Commonly known as: AVAPRO Take 1 tablet (150 mg total) by mouth daily.   Spiriva Respimat 2.5 MCG/ACT Aers Generic drug: Tiotropium Bromide Monohydrate Inhale 2 puffs into the lungs 2 (two) times daily.    triamcinolone 55 MCG/ACT Aero nasal inhaler Commonly known as: NASACORT Place 2 sprays into the nose daily as needed (allergies).   TURMERIC CURCUMIN PO Take 1 tablet by mouth daily.        Objective:   BP (!) 143/80   Pulse 75   Temp 98.6 F (37 C)   Ht _0  (1.6 m)   Wt 176 lb (79.8 kg)   BMI 31.18 kg/m   Wt Readings from Last 3 Encounters:  01/02/20 176 lb (79.8 kg)  02/12/18 176 lb (79.8 kg)  01/29/18 178 lb 9.6 oz (81 kg)    Physical Exam Vitals and nursing note reviewed.  Constitutional:      General: She is not in acute distress.    Appearance: She is well-developed. She is not diaphoretic.  Eyes:     Conjunctiva/sclera: Conjunctivae normal.  Cardiovascular:     Rate and Rhythm: Normal rate and regular rhythm.     Heart sounds: Normal heart sounds. No murmur heard.   Pulmonary:     Effort: Pulmonary effort is normal. No respiratory distress.     Breath sounds: Normal breath sounds. No wheezing.  Musculoskeletal:        General: No tenderness. Normal range of motion.  Skin:    General: Skin is warm and dry.     Findings: No rash.  Neurological:     Mental Status: She is alert and oriented to person, place, and time.     Coordination: Coordination normal.  Psychiatric:        Behavior: Behavior normal.       Assessment & Plan:   Problem List Items Addressed This Visit      Cardiovascular and Mediastinum   HTN (hypertension)   Relevant Medications   irbesartan (AVAPRO) 150 MG tablet   Other Relevant Orders   CMP14+EGFR (Completed)     Respiratory   COPD  GOLD III  - Primary   Relevant Orders   CBC with Differential/Platelet (Completed)     Other   Obesity (BMI 30.0-34.9)   Relevant Orders   Lipid panel (Completed)    Other Visit Diagnoses    Anxiety       Relevant Medications   diazepam (VALIUM) 5 MG tablet   Other Relevant Orders   ToxASSURE Select 13 (MW), Urine (Completed)   Postmenopausal       Relevant Orders   DG [facility]  DEXA      Patient takes regularly once a year, will continue that, order DEXA scan, continue blood pressure, looks good, she says her breathing is doing very well on the Spiriva.  Will check blood work today Follow up plan: Return in about 1 year (around 01/01/2021), or if symptoms worsen or fail to improve, for Hypertension and COPD.  Counseling provided for all of the vaccine components Orders Placed This Encounter  Procedures  . DG [facility] DEXA  . CBC with Differential/Platelet  .  CMP14+EGFR  . Lipid panel    Caryl Pina, MD [facility] 01/02/2020, 11:11 AM

## 2020-01-03 LAB — CMP14+EGFR
ALT: 20 IU/L (ref 0–32)
AST: 17 IU/L (ref 0–40)
Albumin/Globulin Ratio: 2.6 — ABNORMAL HIGH (ref 1.2–2.2)
Albumin: 4.7 g/dL (ref 3.8–4.8)
Alkaline Phosphatase: 109 IU/L (ref 48–121)
BUN/Creatinine Ratio: 24 (ref 12–28)
BUN: 18 mg/dL (ref 8–27)
Bilirubin Total: 0.4 mg/dL (ref 0.0–1.2)
CO2: 24 mmol/L (ref 20–29)
Calcium: 10.2 mg/dL (ref 8.7–10.3)
Chloride: 104 mmol/L (ref 96–106)
Creatinine, Ser: 0.74 mg/dL (ref 0.57–1.00)
GFR calc Af Amer: 95 mL/min/{1.73_m2} (ref >59)
GFR calc non Af Amer: 82 mL/min/{1.73_m2} (ref >59)
Globulin, Total: 1.8 g/dL (ref 1.5–4.5)
Glucose: 80 mg/dL (ref 65–99)
Potassium: 4.7 mmol/L (ref 3.5–5.2)
Sodium: 145 mmol/L — ABNORMAL HIGH (ref 134–144)
Total Protein: 6.5 g/dL (ref 6.0–8.5)

## 2020-01-03 LAB — LIPID PANEL
Chol/HDL Ratio: 5.6 ratio — ABNORMAL HIGH (ref 0.0–4.4)
Cholesterol, Total: 292 mg/dL — ABNORMAL HIGH (ref 100–199)
HDL: 52 mg/dL (ref >39)
LDL Chol Calc (NIH): 208 mg/dL — ABNORMAL HIGH (ref 0–99)
Triglycerides: 171 mg/dL — ABNORMAL HIGH (ref 0–149)
VLDL Cholesterol Cal: 32 mg/dL (ref 5–40)

## 2020-01-03 LAB — CBC WITH DIFFERENTIAL/PLATELET
Basophils Absolute: 0 10*3/uL (ref 0.0–0.2)
Basos: 0 %
EOS (ABSOLUTE): 0.3 10*3/uL (ref 0.0–0.4)
Eos: 3 %
Hematocrit: 45.2 % (ref 34.0–46.6)
Hemoglobin: 15.6 g/dL (ref 11.1–15.9)
Immature Grans (Abs): 0 10*3/uL (ref 0.0–0.1)
Immature Granulocytes: 0 %
Lymphocytes Absolute: 2.1 10*3/uL (ref 0.7–3.1)
Lymphs: 27 %
MCH: 31.6 pg (ref 26.6–33.0)
MCHC: 34.5 g/dL (ref 31.5–35.7)
MCV: 92 fL (ref 79–97)
Monocytes Absolute: 0.8 10*3/uL (ref 0.1–0.9)
Monocytes: 10 %
Neutrophils Absolute: 4.6 10*3/uL (ref 1.4–7.0)
Neutrophils: 60 %
Platelets: 174 10*3/uL (ref 150–450)
RBC: 4.93 x10E6/uL (ref 3.77–5.28)
RDW: 12 % (ref 11.7–15.4)
WBC: 7.8 10*3/uL (ref 3.4–10.8)

## 2020-01-06 LAB — TOXASSURE SELECT 13 (MW), URINE

## 2020-02-17 ENCOUNTER — Other Ambulatory Visit: Payer: Self-pay | Admitting: Family Medicine

## 2020-02-17 DIAGNOSIS — I10 Essential (primary) hypertension: Secondary | ICD-10-CM

## 2020-03-17 ENCOUNTER — Ambulatory Visit (INDEPENDENT_AMBULATORY_CARE_PROVIDER_SITE_OTHER): Payer: Medicare Other

## 2020-03-17 ENCOUNTER — Other Ambulatory Visit: Payer: Self-pay

## 2020-03-17 DIAGNOSIS — Z780 Asymptomatic menopausal state: Secondary | ICD-10-CM | POA: Diagnosis not present

## 2020-04-14 ENCOUNTER — Other Ambulatory Visit: Payer: Medicare Other

## 2020-06-09 ENCOUNTER — Telehealth: Payer: Self-pay

## 2020-06-09 NOTE — Telephone Encounter (Signed)
Erin Guerrero (Key: [id number]) Rx #: [id number] Spiriva Respimat 2.5MCG/ACT aerosol   Form [employer] Medicare Part D Electronic Prior Authorization Form [phone] NCPDP)

## 2020-06-10 NOTE — Telephone Encounter (Signed)
This medication or product is on your plan's list of covered drugs. Prior authorization is not required at this time. If your pharmacy has questions regarding the processing of your prescription, please have them call the [employer] pharmacy help desk at [phone]. **Please note: This decision is based on coverage criteria for the 2022 benefit year and will be effective 06/19/2020.   Pharmacy aware.

## 2020-06-16 ENCOUNTER — Other Ambulatory Visit: Payer: Self-pay

## 2020-06-16 DIAGNOSIS — J449 Chronic obstructive pulmonary disease, unspecified: Secondary | ICD-10-CM

## 2020-06-16 MED ORDER — ALBUTEROL SULFATE HFA 108 (90 BASE) MCG/ACT IN AERS: 1.0000 | INHALATION_SPRAY | Freq: Four times a day (QID) | RESPIRATORY_TRACT | 5 refills | Status: DC | PRN

## 2020-06-16 MED ORDER — SPIRIVA RESPIMAT 2.5 MCG/ACT IN AERS: 2.0000 | INHALATION_SPRAY | Freq: Two times a day (BID) | RESPIRATORY_TRACT | 5 refills | Status: DC

## 2020-06-17 ENCOUNTER — Other Ambulatory Visit: Payer: Self-pay | Admitting: *Deleted

## 2020-06-17 MED ORDER — ALBUTEROL SULFATE HFA 108 (90 BASE) MCG/ACT IN AERS: 1.0000 | INHALATION_SPRAY | Freq: Four times a day (QID) | RESPIRATORY_TRACT | 5 refills | Status: DC | PRN

## 2020-09-08 ENCOUNTER — Telehealth: Payer: Self-pay | Admitting: *Deleted

## 2020-09-08 ENCOUNTER — Other Ambulatory Visit: Payer: Self-pay | Admitting: Family Medicine

## 2020-09-08 DIAGNOSIS — J449 Chronic obstructive pulmonary disease, unspecified: Secondary | ICD-10-CM

## 2020-09-08 NOTE — Telephone Encounter (Signed)
  PA in process  (Key: [id number]) Rx #: [id number] Spiriva Respimat 2.5MCG/ACT aerosol

## 2020-09-08 NOTE — Telephone Encounter (Signed)
OV 01/02/20 rtc 1 yr

## 2020-09-10 NOTE — Telephone Encounter (Signed)
The higher dose of Spiriva for 2 puffs daily was prescribed by Dr. Melvyn Novas her pulmonologist in 2018 and he increased it at that time because she had had some hospitalizations that year and some increased exacerbations.  I do not know if this helps the prior authorization but this was prescribed by her pulmonologist

## 2020-09-10 NOTE — Telephone Encounter (Signed)
Your request was denied. We have denied coverage or payment under your Medicare Part D benefit for the following prescription drug(s) that you or your prescriber requested: Spiriva Spr 2.77mcg Why did we deny your request? We denied this request under Medicare Part D because: Based on the information your doctor gave [place] about your health condition, we are not able to approve Spiriva Spr 2.69mcg. Spiriva Spr 2.27mcg (use as directed: 8g per 30 days) is denied for not meeting the quantity limit requirement. Quantities at or below your plan's limit 1 inhaler (4g) per 30 days will continue to process at the pharmacy for you. Coverage for the requested quantity requires the following: One of the following: (1) The higher dose or quantity is supported in the dosage and administration section of the manufacturer's prescribing information. (2) The higher dose or quantity is supported by one of the following Medicare approved compendia: (A) [employer]. (B) [employer]. Reviewed by: Suella Broad, RP

## 2020-11-02 ENCOUNTER — Encounter: Payer: Self-pay | Admitting: *Deleted

## 2020-11-20 DIAGNOSIS — L03116 Cellulitis of left lower limb: Secondary | ICD-10-CM | POA: Diagnosis not present

## 2020-11-24 ENCOUNTER — Other Ambulatory Visit (HOSPITAL_COMMUNITY): Payer: Self-pay | Admitting: Family Medicine

## 2020-11-24 DIAGNOSIS — Z1231 Encounter for screening mammogram for malignant neoplasm of breast: Secondary | ICD-10-CM

## 2020-12-01 ENCOUNTER — Ambulatory Visit (HOSPITAL_COMMUNITY)
Admission: RE | Admit: 2020-12-01 | Discharge: 2020-12-01 | Disposition: A | Discharge status: Home / Self Care | Payer: Medicare Other | Source: Ambulatory Visit | Attending: Family Medicine | Admitting: Family Medicine

## 2020-12-01 DIAGNOSIS — Z1231 Encounter for screening mammogram for malignant neoplasm of breast: Secondary | ICD-10-CM

## 2021-01-07 ENCOUNTER — Ambulatory Visit: Payer: Medicare Other | Admitting: Family Medicine

## 2021-01-21 ENCOUNTER — Ambulatory Visit (INDEPENDENT_AMBULATORY_CARE_PROVIDER_SITE_OTHER): Payer: Medicare Other | Admitting: Family Medicine

## 2021-01-21 ENCOUNTER — Encounter: Payer: Self-pay | Admitting: Family Medicine

## 2021-01-21 ENCOUNTER — Other Ambulatory Visit: Payer: Self-pay

## 2021-01-21 VITALS — BP 141/80 | HR 72 | Ht 63.0 in | Wt 175.0 lb

## 2021-01-21 DIAGNOSIS — J449 Chronic obstructive pulmonary disease, unspecified: Secondary | ICD-10-CM

## 2021-01-21 DIAGNOSIS — Z79891 Long term (current) use of opiate analgesic: Secondary | ICD-10-CM | POA: Diagnosis not present

## 2021-01-21 DIAGNOSIS — F419 Anxiety disorder, unspecified: Secondary | ICD-10-CM | POA: Diagnosis not present

## 2021-01-21 DIAGNOSIS — I10 Essential (primary) hypertension: Secondary | ICD-10-CM | POA: Diagnosis not present

## 2021-01-21 MED ORDER — IRBESARTAN 150 MG PO TABS: 150.0000 mg | ORAL_TABLET | Freq: Every day | ORAL | 3 refills | Status: DC

## 2021-01-21 MED ORDER — DIAZEPAM 5 MG PO TABS: ORAL_TABLET | ORAL | 1 refills | Status: DC

## 2021-01-21 NOTE — Progress Notes (Signed)
BP (!) 141/80   Pulse 72   Ht 5' 3"  (1.6 m)   Wt 175 lb (79.4 kg)   SpO2 98%   BMI 31.00 kg/m    Subjective:   Patient ID: Erin Guerrero, female    DOB: [birth year], 70 y.o.   MRN: [id number]  HPI: Erin Guerrero is a 70 y.o. female presenting on 01/21/2021 for Medical Management of Chronic Issues and Hypertension   HPI Hypertension Patient is currently on irbesartan, and their blood pressure today is 141/80. Patient denies any lightheadedness or dizziness. Patient denies headaches, blurred vision, chest pains, shortness of breath, or weakness. Denies any side effects from medication and is content with current medication.   COPD Patient is coming in for COPD recheck today.  SHe is currently on albuterol but does not use albuterol very frequent.  He has a mild chronic cough but denies any major coughing spells or wheezing spells.  He has 0 nighttime symptoms per week and 1 daytime symptoms per week currently.   Tremors and spasms recheck Patient uses the occasional Valium when she gets tremors or spasms really bad, she has used 2 in the past 2 years, she just needs refill because hers are getting old Current rx-Valium 5 mg daily as needed # meds rx- 20 Effectiveness of current meds-works well, rarely uses Adverse reactions form meds-none  Pill count performed-No Last drug screen -N/A ( high risk q87m moderate risk q637mlow risk yearly ) Urine drug screen today- Yes [place]  If yes were their any concerning findings? -None  No flowsheet data found.   Controlled substance contract signed on: today   Relevant past medical, surgical, family and social history reviewed and updated as indicated. Interim medical history since our last visit reviewed. Allergies and medications reviewed and updated.  Review of Systems  Constitutional:  Negative for chills and fever.  Eyes:  Negative for visual disturbance.  Respiratory:  Negative for chest tightness and  shortness of breath.   Cardiovascular:  Negative for chest pain and leg swelling.  Genitourinary:  Negative for difficulty urinating and dysuria.  Musculoskeletal:  Negative for back pain and gait problem.  Skin:  Negative for rash.  Neurological:  Negative for dizziness, light-headedness and headaches.  Psychiatric/Behavioral:  Negative for agitation and behavioral problems.   All other systems reviewed and are negative.  Per HPI unless specifically indicated above   Allergies as of 01/21/2021       Reactions   Diphenhydramine Hcl Anaphylaxis   Morphine Rash   Other   Statins Other (See Comments)   Muscle cramps/spasms, Severe skin rash    Red Yeast Rice [cholestin] Rash   Triclosan Rash   Other    Bleach - contact dermatitis   Benzocaine Rash   Latex Rash   Sulfa Antibiotics Rash   Muscle cramps.        Medication List        Accurate as of January 21, 2021 11:31 AM. If you have any questions, ask your nurse or doctor.          albuterol 108 (90 Base) MCG/ACT inhaler Commonly known as: VENTOLIN HFA Inhale 1-2 puffs into the lungs every 6 (six) hours as needed for wheezing or shortness of breath.   aspirin EC 81 MG tablet Take 81 mg by mouth daily.   CALCIUM 1200 PO Take 1,200 mg by mouth daily.   cetirizine 10 MG tablet Commonly known as: ZYRTEC Take 10  mg by mouth daily as needed.   cholecalciferol 1000 units tablet Commonly known as: VITAMIN D Take 2,000 Units by mouth daily.   clobetasol ointment 0.05 % Commonly known as: TEMOVATE APPLY EXTERNALLY TO THE AFFECTED AREA TWICE DAILY   diazepam 5 MG tablet Commonly known as: Valium One every 6 hours for muscle spasm   ibuprofen 800 MG tablet Commonly known as: ADVIL Take 800 mg by mouth every 8 (eight) hours as needed.   irbesartan 150 MG tablet Commonly known as: AVAPRO Take 1 tablet (150 mg total) by mouth daily.   Spiriva Respimat 2.5 MCG/ACT Aers Generic drug: Tiotropium Bromide  Monohydrate INHALE 2 PUFFS INTO THE LUNGS TWICE DAILY   triamcinolone 55 MCG/ACT Aero nasal inhaler Commonly known as: NASACORT Place 2 sprays into the nose daily as needed (allergies).   TURMERIC CURCUMIN PO Take 1 tablet by mouth daily.         Objective:   BP (!) 141/80   Pulse 72   Ht 5' 3"  (1.6 m)   Wt 175 lb (79.4 kg)   SpO2 98%   BMI 31.00 kg/m   Wt Readings from Last 3 Encounters:  01/21/21 175 lb (79.4 kg)  01/02/20 176 lb (79.8 kg)  02/12/18 176 lb (79.8 kg)    Physical Exam Vitals and nursing note reviewed.  Constitutional:      General: She is not in acute distress.    Appearance: She is well-developed. She is not diaphoretic.  Eyes:     Conjunctiva/sclera: Conjunctivae normal.  Cardiovascular:     Rate and Rhythm: Normal rate and regular rhythm.     Heart sounds: Normal heart sounds. No murmur heard. Pulmonary:     Effort: Pulmonary effort is normal. No respiratory distress.     Breath sounds: Normal breath sounds. No wheezing.  Abdominal:     General: Abdomen is flat. Bowel sounds are normal. There is no distension.     Tenderness: There is no abdominal tenderness. There is no guarding or rebound.  Musculoskeletal:        General: No tenderness. Normal range of motion.  Skin:    General: Skin is warm and dry.     Findings: No rash.  Neurological:     Mental Status: She is alert and oriented to person, place, and time.     Coordination: Coordination normal.  Psychiatric:        Behavior: Behavior normal.    Results for orders placed or performed in visit on 01/02/20  CBC with Differential/Platelet  Result Value Ref Range   WBC 7.8 3.4 - 10.8 x10E3/uL   RBC 4.93 3.77 - 5.28 x10E6/uL   Hemoglobin 15.6 11.1 - 15.9 g/dL   Hematocrit 45.2 34.0 - 46.6 %   MCV 92 79 - 97 fL   MCH 31.6 26.6 - 33.0 pg   MCHC 34.5 31.5 - 35.7 g/dL   RDW 12.0 11.7 - 15.4 %   Platelets 174 150 - 450 x10E3/uL   Neutrophils 60 Not Estab. %   Lymphs 27 Not Estab. %    Monocytes 10 Not Estab. %   Eos 3 Not Estab. %   Basos 0 Not Estab. %   Neutrophils Absolute 4.6 1.4 - 7.0 x10E3/uL   Lymphocytes Absolute 2.1 0.7 - 3.1 x10E3/uL   Monocytes Absolute 0.8 0.1 - 0.9 x10E3/uL   EOS (ABSOLUTE) 0.3 0.0 - 0.4 x10E3/uL   Basophils Absolute 0.0 0.0 - 0.2 x10E3/uL   Immature Granulocytes 0 Not Estab. %  Immature Grans (Abs) 0.0 0.0 - 0.1 x10E3/uL  CMP14+EGFR  Result Value Ref Range   Glucose 80 65 - 99 mg/dL   BUN 18 8 - 27 mg/dL   Creatinine, Ser 0.74 0.57 - 1.00 mg/dL   GFR calc non Af Amer 82 >59 mL/min/1.73   GFR calc Af Amer 95 >59 mL/min/1.73   BUN/Creatinine Ratio 24 12 - 28   Sodium 145 (H) 134 - 144 mmol/L   Potassium 4.7 3.5 - 5.2 mmol/L   Chloride 104 96 - 106 mmol/L   CO2 24 20 - 29 mmol/L   Calcium 10.2 8.7 - 10.3 mg/dL   Total Protein 6.5 6.0 - 8.5 g/dL   Albumin 4.7 3.8 - 4.8 g/dL   Globulin, Total 1.8 1.5 - 4.5 g/dL   Albumin/Globulin Ratio 2.6 (H) 1.2 - 2.2   Bilirubin Total 0.4 0.0 - 1.2 mg/dL   Alkaline Phosphatase 109 48 - 121 IU/L   AST 17 0 - 40 IU/L   ALT 20 0 - 32 IU/L  Lipid panel  Result Value Ref Range   Cholesterol, Total 292 (H) 100 - 199 mg/dL   Triglycerides 171 (H) 0 - 149 mg/dL   HDL 52 >39 mg/dL   VLDL Cholesterol Cal 32 5 - 40 mg/dL   LDL Chol Calc (NIH) 208 (H) 0 - 99 mg/dL   Chol/HDL Ratio 5.6 (H) 0.0 - 4.4 ratio  ToxASSURE Select 13 (MW), Urine  Result Value Ref Range   Summary Note     Assessment & Plan:   Problem List Items Addressed This Visit       Cardiovascular and Mediastinum   HTN (hypertension) - Primary   Relevant Medications   irbesartan (AVAPRO) 150 MG tablet   Other Relevant Orders   CBC with Differential/Platelet   Lipid panel     Respiratory   COPD  GOLD III    Other Visit Diagnoses     Essential hypertension       Relevant Medications   irbesartan (AVAPRO) 150 MG tablet   Other Relevant Orders   CMP14+EGFR   Lipid panel   Anxiety       Relevant Medications    diazepam (VALIUM) 5 MG tablet   Other Relevant Orders   ToxASSURE Select 13 (MW), Urine       Continue current medication, will check blood work Follow up plan: Return in about 1 year (around 01/21/2022), or if symptoms worsen or fail to improve, for Hypertension and anxiety recheck.  Counseling provided for all of the vaccine components Orders Placed This Encounter  Procedures   ToxASSURE Select 13 (MW), Urine   CBC with Differential/Platelet   CMP14+EGFR   Lipid panel    Caryl Pina, MD [facility] 01/21/2021, 11:31 AM

## 2021-01-22 LAB — LIPID PANEL
Chol/HDL Ratio: 5.3 ratio — ABNORMAL HIGH (ref 0.0–4.4)
Cholesterol, Total: 301 mg/dL — ABNORMAL HIGH (ref 100–199)
HDL: 57 mg/dL (ref >39)
LDL Chol Calc (NIH): 213 mg/dL — ABNORMAL HIGH (ref 0–99)
Triglycerides: 163 mg/dL — ABNORMAL HIGH (ref 0–149)
VLDL Cholesterol Cal: 31 mg/dL (ref 5–40)

## 2021-01-22 LAB — CMP14+EGFR
ALT: 22 IU/L (ref 0–32)
AST: 22 IU/L (ref 0–40)
Albumin/Globulin Ratio: 2 (ref 1.2–2.2)
Albumin: 4.5 g/dL (ref 3.7–4.7)
Alkaline Phosphatase: 90 IU/L (ref 44–121)
BUN/Creatinine Ratio: 16 (ref 12–28)
BUN: 10 mg/dL (ref 8–27)
Bilirubin Total: 0.4 mg/dL (ref 0.0–1.2)
CO2: 27 mmol/L (ref 20–29)
Calcium: 10.2 mg/dL (ref 8.7–10.3)
Chloride: 103 mmol/L (ref 96–106)
Creatinine, Ser: 0.62 mg/dL (ref 0.57–1.00)
Globulin, Total: 2.3 g/dL (ref 1.5–4.5)
Glucose: 83 mg/dL (ref 65–99)
Potassium: 4.3 mmol/L (ref 3.5–5.2)
Sodium: 143 mmol/L (ref 134–144)
Total Protein: 6.8 g/dL (ref 6.0–8.5)
eGFR: 95 mL/min/{1.73_m2} (ref >59)

## 2021-01-22 LAB — CBC WITH DIFFERENTIAL/PLATELET
Basophils Absolute: 0 10*3/uL (ref 0.0–0.2)
Basos: 1 %
EOS (ABSOLUTE): 0.2 10*3/uL (ref 0.0–0.4)
Eos: 3 %
Hematocrit: 46 % (ref 34.0–46.6)
Hemoglobin: 15.3 g/dL (ref 11.1–15.9)
Immature Grans (Abs): 0 10*3/uL (ref 0.0–0.1)
Immature Granulocytes: 0 %
Lymphocytes Absolute: 2.7 10*3/uL (ref 0.7–3.1)
Lymphs: 35 %
MCH: 30.8 pg (ref 26.6–33.0)
MCHC: 33.3 g/dL (ref 31.5–35.7)
MCV: 93 fL (ref 79–97)
Monocytes Absolute: 0.8 10*3/uL (ref 0.1–0.9)
Monocytes: 10 %
Neutrophils Absolute: 4.1 10*3/uL (ref 1.4–7.0)
Neutrophils: 51 %
Platelets: 198 10*3/uL (ref 150–450)
RBC: 4.96 x10E6/uL (ref 3.77–5.28)
RDW: 12.3 % (ref 11.7–15.4)
WBC: 7.8 10*3/uL (ref 3.4–10.8)

## 2021-01-24 ENCOUNTER — Telehealth: Payer: Self-pay | Admitting: Family Medicine

## 2021-01-24 DIAGNOSIS — J449 Chronic obstructive pulmonary disease, unspecified: Secondary | ICD-10-CM

## 2021-01-24 NOTE — Telephone Encounter (Signed)
Per pt Spiriva 2 puffs once daily added to medication list. Removed the 2 puffs BID

## 2021-01-24 NOTE — Telephone Encounter (Signed)
It was not meant to be changed, what was he taking previously, Spiriva is commonly given as 2 inhalations once daily in the Respimat format.  Let me know which she was taken previously

## 2021-01-26 LAB — TOXASSURE SELECT 13 (MW), URINE

## 2021-01-26 NOTE — Progress Notes (Signed)
Please inform her that the diet and exercises not going to be enough to bring these numbers down, I heavily recommend for her to get a referral to community care medicine and see Lottie Dawson, she is in our office here.  With these numbers she is essentially like a ticking time bomb with her heart and risk for stroke.  I heavily recommend for her to come to be seen by Almyra Free who is essentially like a Naval architect as a Software engineer

## 2021-01-31 ENCOUNTER — Other Ambulatory Visit: Payer: Self-pay | Admitting: Family Medicine

## 2021-01-31 ENCOUNTER — Ambulatory Visit (INDEPENDENT_AMBULATORY_CARE_PROVIDER_SITE_OTHER): Payer: Medicare Other

## 2021-01-31 VITALS — Ht 63.0 in | Wt 175.0 lb

## 2021-01-31 DIAGNOSIS — Z0000 Encounter for general adult medical examination without abnormal findings: Secondary | ICD-10-CM | POA: Diagnosis not present

## 2021-01-31 DIAGNOSIS — I10 Essential (primary) hypertension: Secondary | ICD-10-CM

## 2021-01-31 NOTE — Patient Instructions (Signed)
Erin Guerrero , Thank you for taking time to come for your Medicare Wellness Visit. I appreciate your ongoing commitment to your health goals. Please review the following plan we discussed and let me know if I can assist you in the future.   Screening recommendations/referrals: Colonoscopy: Done 03/16/2016 - Repeat in 10 years Mammogram: Done 12/01/2020 - Repeat annually Bone Density: Done 03/17/2020 - Repeat every 2 yearsD Recommended yearly ophthalmology/optometry visit for glaucoma screening and checkup Recommended yearly dental visit for hygiene and checkup  Vaccinations: Influenza vaccine: Done 2021 - due every fall Pneumococcal vaccine: Done 07/20/2016 & 12/31/2017 Tdap vaccine: Done 01/04/2018 - Repeat in 10 years Shingles vaccine: Due. Patient declines at this time   Covid-19: Done 08/09/19, 09/13/19, & 04/09/2020 - due for second booster  Advanced directives: Advance directive discussed with you today. Even though you declined this today, please call our office should you change your mind, and we can give you the proper paperwork for you to fill out.   Conditions/risks identified: Aim for 30 minutes of exercise or brisk walking each day, drink 6-8 glasses of water and eat lots of fruits and vegetables.   Next appointment: Follow up in one year for your annual wellness visit    Preventive Care 65 Years and Older, Female Preventive care refers to lifestyle choices and visits with your health care provider that can promote health and wellness. What does preventive care include? A yearly physical exam. This is also called an annual well check. Dental exams once or twice a year. Routine eye exams. Ask your health care provider how often you should have your eyes checked. Personal lifestyle choices, including: Daily care of your teeth and gums. Regular physical activity. Eating a healthy diet. Avoiding tobacco and drug use. Limiting alcohol use. Practicing safe sex. Taking low-dose  aspirin every day. Taking vitamin and mineral supplements as recommended by your health care provider. What happens during an annual well check? The services and screenings done by your health care provider during your annual well check will depend on your age, overall health, lifestyle risk factors, and family history of disease. Counseling  Your health care provider may ask you questions about your: Alcohol use. Tobacco use. Drug use. Emotional well-being. Home and relationship well-being. Sexual activity. Eating habits. History of falls. Memory and ability to understand (cognition). Work and work Statistician. Reproductive health. Screening  You may have the following tests or measurements: Height, weight, and BMI. Blood pressure. Lipid and cholesterol levels. These may be checked every 5 years, or more frequently if you are over 75 years old. Skin check. Lung cancer screening. You may have this screening every year starting at age 55 if you have a 30-pack-year history of smoking and currently smoke or have quit within the past 15 years. Fecal occult blood test (FOBT) of the stool. You may have this test every year starting at age 10. Flexible sigmoidoscopy or colonoscopy. You may have a sigmoidoscopy every 5 years or a colonoscopy every 10 years starting at age 37. Hepatitis C blood test. Hepatitis B blood test. Sexually transmitted disease (STD) testing. Diabetes screening. This is done by checking your blood sugar (glucose) after you have not eaten for a while (fasting). You may have this done every 1-3 years. Bone density scan. This is done to screen for osteoporosis. You may have this done starting at age 10. Mammogram. This may be done every 1-2 years. Talk to your health care provider about how often you should have  regular mammograms. Talk with your health care provider about your test results, treatment options, and if necessary, the need for more tests. Vaccines  Your  health care provider may recommend certain vaccines, such as: Influenza vaccine. This is recommended every year. Tetanus, diphtheria, and acellular pertussis (Tdap, Td) vaccine. You may need a Td booster every 10 years. Zoster vaccine. You may need this after age 52. Pneumococcal 13-valent conjugate (PCV13) vaccine. One dose is recommended after age 66. Pneumococcal polysaccharide (PPSV23) vaccine. One dose is recommended after age 87. Talk to your health care provider about which screenings and vaccines you need and how often you need them. This information is not intended to replace advice given to you by your health care provider. Make sure you discuss any questions you have with your health care provider. Document Released: 07/02/2015 Document Revised: 02/23/2016 Document Reviewed: 04/06/2015 Elsevier Interactive Patient Education  2017 [employer] Prevention in the Home Falls can cause injuries. They can happen to people of all ages. There are many things you can do to make your home safe and to help prevent falls. What can I do on the outside of my home? Regularly fix the edges of walkways and driveways and fix any cracks. Remove anything that might make you trip as you walk through a door, such as a raised step or threshold. Trim any bushes or trees on the path to your home. Use bright outdoor lighting. Clear any walking paths of anything that might make someone trip, such as rocks or tools. Regularly check to see if handrails are loose or broken. Make sure that both sides of any steps have handrails. Any raised decks and porches should have guardrails on the edges. Have any leaves, snow, or ice cleared regularly. Use sand or salt on walking paths during winter. Clean up any spills in your garage right away. This includes oil or grease spills. What can I do in the bathroom? Use night lights. Install grab bars by the toilet and in the tub and shower. Do not use towel bars as  grab bars. Use non-skid mats or decals in the tub or shower. If you need to sit down in the shower, use a plastic, non-slip stool. Keep the floor dry. Clean up any water that spills on the floor as soon as it happens. Remove soap buildup in the tub or shower regularly. Attach bath mats securely with double-sided non-slip rug tape. Do not have throw rugs and other things on the floor that can make you trip. What can I do in the bedroom? Use night lights. Make sure that you have a light by your bed that is easy to reach. Do not use any sheets or blankets that are too big for your bed. They should not hang down onto the floor. Have a firm chair that has side arms. You can use this for support while you get dressed. Do not have throw rugs and other things on the floor that can make you trip. What can I do in the kitchen? Clean up any spills right away. Avoid walking on wet floors. Keep items that you use a lot in easy-to-reach places. If you need to reach something above you, use a strong step stool that has a grab bar. Keep electrical cords out of the way. Do not use floor polish or wax that makes floors slippery. If you must use wax, use non-skid floor wax. Do not have throw rugs and other things on the floor that  can make you trip. What can I do with my stairs? Do not leave any items on the stairs. Make sure that there are handrails on both sides of the stairs and use them. Fix handrails that are broken or loose. Make sure that handrails are as long as the stairways. Check any carpeting to make sure that it is firmly attached to the stairs. Fix any carpet that is loose or worn. Avoid having throw rugs at the top or bottom of the stairs. If you do have throw rugs, attach them to the floor with carpet tape. Make sure that you have a light switch at the top of the stairs and the bottom of the stairs. If you do not have them, ask someone to add them for you. What else can I do to help prevent  falls? Wear shoes that: Do not have high heels. Have rubber bottoms. Are comfortable and fit you well. Are closed at the toe. Do not wear sandals. If you use a stepladder: Make sure that it is fully opened. Do not climb a closed stepladder. Make sure that both sides of the stepladder are locked into place. Ask someone to hold it for you, if possible. Clearly mark and make sure that you can see: Any grab bars or handrails. First and last steps. Where the edge of each step is. Use tools that help you move around (mobility aids) if they are needed. These include: Canes. Walkers. Scooters. Crutches. Turn on the lights when you go into a dark area. Replace any light bulbs as soon as they burn out. Set up your furniture so you have a clear path. Avoid moving your furniture around. If any of your floors are uneven, fix them. If there are any pets around you, be aware of where they are. Review your medicines with your doctor. Some medicines can make you feel dizzy. This can increase your chance of falling. Ask your doctor what other things that you can do to help prevent falls. This information is not intended to replace advice given to you by your health care provider. Make sure you discuss any questions you have with your health care provider. Document Released: 04/01/2009 Document Revised: 11/11/2015 Document Reviewed: 07/10/2014 Elsevier Interactive Patient Education  2017 [employer].

## 2021-01-31 NOTE — Progress Notes (Signed)
Subjective:   Erin Guerrero is a 70 y.o. female who presents for an Initial Medicare Annual Wellness Visit.  Virtual Visit via Telephone Note  I connected with  Erin Guerrero on 01/31/21 at 10:30 AM EDT by telephone and verified that I am speaking with the correct person using two identifiers.  Location: Patient: Home Provider: [facility] Persons participating in the virtual visit: patient/Nurse Health Advisor   I discussed the limitations, risks, security and privacy concerns of performing an evaluation and management service by telephone and the availability of in person appointments. The patient expressed understanding and agreed to proceed.  Interactive audio and video telecommunications were attempted between this nurse and patient, however failed, due to patient having technical difficulties OR patient did not have access to video capability.  We continued and completed visit with audio only.  Some vital signs may be absent or patient reported.   Erin Guerrero E Erin Soulliere, LPN   Review of Systems     Cardiac Risk Factors include: advanced age (>67mn, >>60women);dyslipidemia;hypertension;smoking/ tobacco exposure;sedentary lifestyle;obesity (BMI >30kg/m2);Other (see comment), Risk factor comments: COPD     Objective:    Today's Vitals   01/31/21 1033  Weight: 175 lb (79.4 kg)  Height: '5\' 3"'$  (1.6 m)   Body mass index is 31 kg/m.  Advanced Directives 01/31/2021 09/03/2016 08/05/2016 08/04/2016 03/16/2016  Does Patient Have a Medical Advance Directive? No No No No No  Would patient like information on creating a medical advance directive? No - Patient declined - No - Patient declined No - Patient declined No - patient declined information    Current Medications (verified) Outpatient Encounter Medications as of 01/31/2021  Medication Sig   albuterol (VENTOLIN HFA) 108 (90 Base) MCG/ACT inhaler Inhale 1-2 puffs into the lungs every 6 (six) hours as needed for wheezing or shortness of  breath.   aspirin EC 81 MG tablet Take 81 mg by mouth daily.   Calcium Carbonate-Vit D-Min (CALCIUM 1200 PO) Take 1,200 mg by mouth daily.   cetirizine (ZYRTEC) 10 MG tablet Take 10 mg by mouth daily as needed.    cholecalciferol (VITAMIN D) 1000 units tablet Take 2,000 Units by mouth daily.   clobetasol ointment (TEMOVATE) 0.05 % APPLY EXTERNALLY TO THE AFFECTED AREA TWICE DAILY   diazepam (VALIUM) 5 MG tablet One every 6 hours for muscle spasm   ibuprofen (ADVIL,MOTRIN) 800 MG tablet Take 800 mg by mouth every 8 (eight) hours as needed.   irbesartan (AVAPRO) 150 MG tablet Take 1 tablet (150 mg total) by mouth daily.   Tiotropium Bromide Monohydrate 2.5 MCG/ACT AERS Inhale 2 puffs into the lungs daily.   triamcinolone (NASACORT) 55 MCG/ACT AERO nasal inhaler Place 2 sprays into the nose daily as needed (allergies).    TURMERIC CURCUMIN PO Take 1 tablet by mouth daily.   No facility-administered encounter medications on file as of 01/31/2021.    Allergies (verified) Diphenhydramine hcl, Morphine, Statins, Red yeast rice [cholestin], Triclosan, Other, Benzocaine, Latex, and Sulfa antibiotics   History: Past Medical History:  Diagnosis Date   [place] ([place]    CAP (community acquired pneumonia)    Cataract    Chronic bronchitis (HCC)    COPD (chronic obstructive pulmonary disease) (HCC)    HTN (hypertension)    Hyperlipidemia    Influenza    Osteoarthritis    Pulmonary nodule    Seasonal allergies    Tobacco use    Past Surgical History:  Procedure Laterality Date  BREAST BIOPSY  2014   Benign   Cervical cryo-treatment  1972   CHOLECYSTECTOMY  1998   COLONOSCOPY     Polyps   COLONOSCOPY N/A 03/16/2016   Procedure: COLONOSCOPY;  Surgeon: Daneil Dolin, MD;  [facility];  Service: Endoscopy;  Laterality: N/A;  1:30 PM   DILATION AND CURETTAGE OF UTERUS     GANGLION CYST EXCISION  1970   Left wrist   GANGLION CYST EXCISION     Right hand and  left ring finger   [place], 1987   MENISCECTOMY  2008   POLYPECTOMY  03/16/2016   Procedure: POLYPECTOMY;  Surgeon: Daneil Dolin, MD;  Location: [facility];  Service: Endoscopy;;  colon   ROTATOR CUFF REPAIR  2004   Right   TUBAL LIGATION     Family History  Problem Relation Age of Onset   Cancer Mother 31       Question cervical/ovarian   Arthritis Mother    Kidney failure Sister 27       born with one kidney, other kidney injured by mva   Kidney failure Sister    Lupus Sister    Diabetes Sister    Thyroid cancer Daughter    Arthritis Brother    Hearing loss Brother    Scoliosis Brother    Celiac disease Neg Hx    Inflammatory bowel disease Neg Hx    Social History   Socioeconomic History   Marital status: Widowed    Spouse name: Not on file   Number of children: 1   Years of education: Not on file   Highest education level: Not on file  Occupational History   Occupation: PT for [employer], OR/ER nurse  Tobacco Use   Smoking status: Some Days    Packs/day: 0.25    Years: 50.00    Pack years: 12.50    Types: Cigarettes   Smokeless tobacco: Never   Tobacco comments:    Smoker of 1 ppd x 50 years - quit 03/2017 - started back light smoker currently 4-5 per day  Vaping Use   Vaping Use: Never used  Substance and Sexual Activity   Alcohol use: No   Drug use: No   Sexual activity: Never  Other Topics Concern   Not on file  Social History Narrative   Lives alone.   Daughter lives 20 miles away in [place].   Social Determinants of Health   Financial Resource Strain: Low Risk    Difficulty of Paying Living Expenses: Not hard at all  Food Insecurity: No Food Insecurity   Worried About Charity fundraiser in the Last Year: Never true   [place] in the Last Year: Never true  Transportation Needs: No Transportation Needs   Lack of Transportation (Medical): No   Lack of Transportation (Non-Medical): No  Physical Activity: Insufficiently  Active   Days of Exercise per Week: 3 days   Minutes of Exercise per Session: 30 min  Stress: No Stress Concern Present   Feeling of Stress : Not at all  Social Connections: Socially Isolated   Frequency of Communication with Friends and Family: Three times a week   Frequency of Social Gatherings with Friends and Family: Three times a week   Attends Religious Services: Never   Active Member of Clubs or Organizations: No   Attends Archivist Meetings: Never   Marital Status: Widowed    Tobacco Counseling Ready to quit:  Yes Counseling given: Yes Tobacco comments: Smoker of 1 ppd x 50 years - quit 03/2017 - started back light smoker currently 4-5 per day   Clinical Intake:  Pre-visit preparation completed: Yes  Pain : No/denies pain     BMI - recorded: 31 Nutritional Status: BMI > 30  Obese Nutritional Risks: None Diabetes: No  How often do you need to have someone help you when you read instructions, pamphlets, or other written materials from your doctor or pharmacy?: 1 - Never  Diabetic? No  Interpreter Needed?: No  Information entered by :: Daulton Harbaugh, LPN   Activities of Daily Living In your present state of health, do you have any difficulty performing the following activities: 01/31/2021  Hearing? N  Vision? N  Difficulty concentrating or making decisions? N  Walking or climbing stairs? Y  Dressing or bathing? N  Doing errands, shopping? N  Preparing Food and eating ? N  Using the Toilet? N  In the past six months, have you accidently leaked urine? N  Do you have problems with loss of bowel control? N  Managing your Medications? N  Managing your Finances? N  Housekeeping or managing your Housekeeping? N  Some recent data might be hidden    Patient Care Team: Dettinger, Fransisca Kaufmann, MD as PCP - General (Family Medicine) Gala Romney, Cristopher Estimable, MD as Consulting Physician (Gastroenterology)  Indicate any recent Medical Services you may have received  from other than [facility] providers in the past year (date may be approximate).     Assessment:   This is a routine wellness examination for [place].  Hearing/Vision screen Hearing Screening - Comments:: Denies hearing difficulties  Vision Screening - Comments:: Denies vision difficulties - behind on annual eye exams with [facility]ana Ho[facility] in [place] issues and exercise activities discussed: Current Exercise Habits: Home exercise routine, Type of exercise: strength training/weights;stretching;walking, Time (Minutes): 30, Frequency (Times/Week): 3, Weekly Exercise (Minutes/Week): 90, Intensity: Moderate, Exercise limited by: respiratory conditions(s)   Goals Addressed             This Visit's Progress    Exercise 3x per week (30 min per time)       Quit Smoking         Depression Screen PHQ 2/9 Scores 01/31/2021 01/02/2020 02/12/2018 12/31/2017 07/02/2017 12/28/2016 09/21/2016  PHQ - 2 Score 0 0 0 0 0 0 0    Fall Risk Fall Risk  01/31/2021 01/02/2020 05/14/2019 02/12/2018 12/31/2017  Falls in the past year? 0 0 0 No No  Comment - - Emmi Telephone Survey: data to providers prior to load - -  Number falls in past yr: 0 - - - -  Injury with Fall? 0 - - - -  Risk for fall due to : Medication side effect - - - -  Follow up Falls prevention discussed - - - -    FALL RISK PREVENTION PERTAINING TO THE HOME:  Any stairs in or around the home? No  If so, are there any without handrails? Yes  Home free of loose throw rugs in walkways, pet beds, electrical cords, etc? No  Adequate lighting in your home to reduce risk of falls? No   ASSISTIVE DEVICES UTILIZED TO PREVENT FALLS:  Life alert? No  Use of a cane, walker or w/c? No  Grab bars in the bathroom? No  Shower chair or bench in shower? No  Elevated toilet seat or a handicapped toilet? No   TIMED UP  AND GO:  Was the test performed? No . Telephonic visit  Cognitive Function:     6CIT Screen 01/31/2021  What Year? 0  points  What month? 0 points  What time? 0 points  Count back from 20 0 points  Months in reverse 0 points  Repeat phrase 0 points  Total Score 0    Immunizations Immunization History  Administered Date(s) Administered   Influenza Whole 03/19/2016   Influenza, High Dose Seasonal PF 04/19/2017   PFIZER(Purple Top)SARS-COV-2 Vaccination 08/09/2019, 09/13/2019, 04/09/2020   Pneumococcal Conjugate-13 12/31/2017   Pneumococcal-Unspecified 07/20/2016   Tdap 01/04/2018    TDAP status: Up to date  Flu Vaccine status: Up to date  Pneumococcal vaccine status: Up to date  Covid-19 vaccine status: Completed vaccines  Qualifies for Shingles Vaccine? Yes   Zostavax completed No   Shingrix Completed?: No.    Education has been provided regarding the importance of this vaccine. Patient has been advised to call insurance company to determine out of pocket expense if they have not yet received this vaccine. Advised may also receive vaccine at local pharmacy or Health Dept. Verbalized acceptance and understanding.  Screening Tests Health Maintenance  Topic Date Due   Zoster Vaccines- Shingrix (1 of 2) Never done   COVID-19 Vaccine (4 - Booster for Pfizer series) 07/10/2020   INFLUENZA VACCINE  01/17/2021   DEXA SCAN  03/17/2022   MAMMOGRAM  12/02/2022   COLONOSCOPY (Pts 45-79yr Insurance coverage will need to be confirmed)  03/16/2026   TETANUS/TDAP  01/05/2028   Hepatitis C Screening  Completed   PNA vac Low Risk Adult  Completed   HPV VACCINES  Aged Out    Health Maintenance  Health Maintenance Due  Topic Date Due   Zoster Vaccines- Shingrix (1 of 2) Never done   COVID-19 Vaccine (4 - Booster for Pfizer series) 07/10/2020   INFLUENZA VACCINE  01/17/2021    Colorectal cancer screening: Type of screening: Colonoscopy. Completed 03/16/2016. Repeat every 10 years  Mammogram status: Completed 12/01/2020. Repeat every year  Bone Density status: Completed /29/2021. Results reflect:  Bone density results: OSTEOPENIA. Repeat every 2 years.  Lung Cancer Screening: (Low Dose CT Chest recommended if Age 71-80years, 30 pack-year currently smoking OR have quit w/in 15years.) does qualify.   Lung Cancer Screening Referral:  Declines at this time  Additional Screening:  Hepatitis C Screening: does qualify; Completed 07/02/2017  Vision Screening: Recommended annual ophthalmology exams for early detection of glaucoma and other disorders of the eye. Is the patient up to date with their annual eye exam?  No  Who is the provider or what is the name of the office in which the patient attends annual eye exams? [facility] pt is not established with a provider, would they like to be referred to a provider to establish care? No .   Dental Screening: Recommended annual dental exams for proper oral hygiene  Community Resource Referral / Chronic Care Management: CRR required this visit?  No   CCM required this visit?  No      Plan:     I have personally reviewed and noted the following in the patient's chart:   Medical and social history Use of alcohol, tobacco or illicit drugs  Current medications and supplements including opioid prescriptions. Patient is not currently taking opioid prescriptions. Functional ability and status Nutritional status Physical activity Advanced directives List of other physicians Hospitalizations, surgeries, and ER visits in previous 12 months  Vitals Screenings to include cognitive, depression, and falls Referrals and appointments  In addition, I have reviewed and discussed with patient certain preventive protocols, quality metrics, and best practice recommendations. A written personalized care plan for preventive services as well as general preventive health recommendations were provided to patient.     Sandrea Hammond, LPN   [id number]   Nurse Notes: None

## 2021-02-18 DIAGNOSIS — H6983 Other specified disorders of Eustachian tube, bilateral: Secondary | ICD-10-CM | POA: Diagnosis not present

## 2021-02-18 DIAGNOSIS — H903 Sensorineural hearing loss, bilateral: Secondary | ICD-10-CM | POA: Diagnosis not present

## 2021-02-18 DIAGNOSIS — H9313 Tinnitus, bilateral: Secondary | ICD-10-CM | POA: Diagnosis not present

## 2021-02-18 DIAGNOSIS — H6123 Impacted cerumen, bilateral: Secondary | ICD-10-CM | POA: Diagnosis not present

## 2021-02-19 ENCOUNTER — Other Ambulatory Visit: Payer: Self-pay | Admitting: Family Medicine

## 2021-03-08 ENCOUNTER — Telehealth: Payer: Self-pay

## 2021-03-08 NOTE — Telephone Encounter (Signed)
Error

## 2021-03-11 MED ORDER — UMECLIDINIUM BROMIDE 62.5 MCG/INH IN AEPB: 1.0000 | INHALATION_SPRAY | Freq: Every day | RESPIRATORY_TRACT | 3 refills | Status: DC

## 2021-03-11 NOTE — Addendum Note (Signed)
Addended by: Caryl Pina on: 03/11/2021 12:42 PM   Modules accepted: Orders

## 2021-03-11 NOTE — Telephone Encounter (Signed)
Please let the patient know that I have sent a replacement medicine for her Spiriva because the insurance company says they will not cover it.  I have sent Incruse

## 2021-03-11 NOTE — Telephone Encounter (Signed)
Message from Plan Request Reference Number: [id number]. SPIRIVA SPR 2.5MCG is denied for not meeting the prior authorization requirement(s). Details of this decision are in the notice attached below or have been faxed to you. Appeals are not supported through [place]. Please refer to the fax case notice for appeals information and instructions.      PA was started 10 days ago :  [name] (Key: [id number]) Rx #: [id number] Spiriva Respimat 2.5MCG/ACT aerosol

## 2021-03-11 NOTE — Telephone Encounter (Signed)
PT AWARE  

## 2021-03-21 ENCOUNTER — Telehealth: Payer: Self-pay | Admitting: Family Medicine

## 2021-03-21 ENCOUNTER — Other Ambulatory Visit: Payer: Self-pay

## 2021-03-21 NOTE — Telephone Encounter (Signed)
Okay yes that is Spiriva sent was the right inhaler, I do not know why I got that message that said that the insurance would not cover it.  So ignore the Incruse, please fill the Spiriva and let us know if you have any troubles with it.

## 2021-03-21 NOTE — Telephone Encounter (Signed)
One week ago  Insurance denies Risk manager changed inhaler to one that is covered.  Pt called her insurance and they still cover it they just need a new Rx sent.  Dettinger,  Will you make sure this is the right inhaler?  Rx sent to [facility] in [place]. Pt made aware.

## 2021-03-23 NOTE — Telephone Encounter (Signed)
Spoke with pt. She has called her insurance and a PA is not required. Insurance spoke with pharmacy as well today per pt. Seems there was miscommunication between pharm and insurance. Pt believes they gave her Rx too soon last month.  No PA is needed.

## 2021-03-23 NOTE — Telephone Encounter (Signed)
Pt called to let Dr Dettinger know that [facility] received new Rx for Spiriva but says now they are telling her that a PA is required before insurance will pay.

## 2021-04-11 DIAGNOSIS — H2513 Age-related nuclear cataract, bilateral: Secondary | ICD-10-CM | POA: Diagnosis not present

## 2021-04-23 ENCOUNTER — Other Ambulatory Visit: Payer: Self-pay | Admitting: Family Medicine

## 2021-05-16 ENCOUNTER — Other Ambulatory Visit: Payer: Self-pay | Admitting: Family Medicine

## 2021-05-17 ENCOUNTER — Telehealth: Payer: Self-pay | Admitting: Family Medicine

## 2021-05-17 NOTE — Telephone Encounter (Signed)
  Prescription Request  05/17/2021  Is this a "Controlled Substance" medicine? no  Have you seen your PCP in the last 2 weeks? no  If YES, route message to pool  -  If NO, patient needs to be scheduled for appointment.  What is the name of the medication or equipment? Clover 2.5 MCG/ACT AERS   Have you contacted your pharmacy to request a refill? Yes but refused. Pt says she needs it   Which pharmacy would you like this sent to? [facility]   Patient notified that their request is being sent to the clinical staff for review and that they should receive a response within 2 business days.

## 2021-05-18 MED ORDER — SPIRIVA RESPIMAT 2.5 MCG/ACT IN AERS: 2.0000 | INHALATION_SPRAY | Freq: Two times a day (BID) | RESPIRATORY_TRACT | 5 refills | Status: DC

## 2021-05-18 NOTE — Telephone Encounter (Signed)
Talked w/ pt about the Spiriva, I had denied because it was not on her med list & had been marked on 03/11/21 DCd d/t not covered by insurance, looked through PA notes & TC notes after that day and there was a mixup at the pharmacy, script was put back on med list and refill sent to pharmacy

## 2021-05-20 ENCOUNTER — Telehealth: Payer: Self-pay

## 2021-05-20 NOTE — Telephone Encounter (Signed)
Received request for PA from [facility] for Spiriva.  Pt has not changed insurance and this medication is covered under her plan.  Made [facility] aware.

## 2021-05-24 ENCOUNTER — Other Ambulatory Visit: Payer: Self-pay

## 2021-10-24 ENCOUNTER — Other Ambulatory Visit: Payer: Self-pay | Admitting: Family Medicine

## 2021-10-24 DIAGNOSIS — I10 Essential (primary) hypertension: Secondary | ICD-10-CM

## 2022-01-18 ENCOUNTER — Other Ambulatory Visit: Payer: Self-pay | Admitting: Family Medicine

## 2022-01-18 DIAGNOSIS — I10 Essential (primary) hypertension: Secondary | ICD-10-CM

## 2022-01-23 ENCOUNTER — Encounter: Payer: Self-pay | Admitting: Family Medicine

## 2022-01-23 ENCOUNTER — Ambulatory Visit (INDEPENDENT_AMBULATORY_CARE_PROVIDER_SITE_OTHER): Payer: Medicare Other | Admitting: Family Medicine

## 2022-01-23 VITALS — BP 138/75 | HR 70 | Temp 98.0°F | Ht 63.0 in | Wt 169.0 lb

## 2022-01-23 DIAGNOSIS — Z23 Encounter for immunization: Secondary | ICD-10-CM

## 2022-01-23 DIAGNOSIS — G629 Polyneuropathy, unspecified: Secondary | ICD-10-CM

## 2022-01-23 DIAGNOSIS — J449 Chronic obstructive pulmonary disease, unspecified: Secondary | ICD-10-CM

## 2022-01-23 DIAGNOSIS — E7800 Pure hypercholesterolemia, unspecified: Secondary | ICD-10-CM | POA: Diagnosis not present

## 2022-01-23 DIAGNOSIS — Z0001 Encounter for general adult medical examination with abnormal findings: Secondary | ICD-10-CM | POA: Diagnosis not present

## 2022-01-23 DIAGNOSIS — I10 Essential (primary) hypertension: Secondary | ICD-10-CM

## 2022-01-23 DIAGNOSIS — Z0000 Encounter for general adult medical examination without abnormal findings: Secondary | ICD-10-CM | POA: Diagnosis not present

## 2022-01-23 NOTE — Addendum Note (Signed)
Addended by: Alphonzo Dublin on: 01/23/2022 11:45 AM   Modules accepted: Orders

## 2022-01-23 NOTE — Progress Notes (Signed)
BP 138/75   Pulse 70   Temp 98 F (36.7 C)   Ht 5' 3"  (1.6 m)   Wt 169 lb (76.7 kg)   SpO2 98%   BMI 29.94 kg/m    Subjective:   Patient ID: Erin Guerrero, female    DOB: 16-Mar-1951, 71 y.o.   MRN: [id number]  HPI: TERRESSA EVOLA is a 71 y.o. female presenting on 01/23/2022 for Medical Management of Chronic Issues (CPE) and Hypertension   HPI Physical exam: Patient denies any chest pain, shortness of breath, headaches or vision issues, abdominal complaints, diarrhea, nausea, vomiting, or joint issues.   Hypertension Patient is currently on irbesartan, and their blood pressure today is 138/75. Patient denies any lightheadedness or dizziness. Patient denies headaches, blurred vision, chest pains, shortness of breath, or weakness. Denies any side effects from medication and is content with current medication.   Hyperlipidemia Patient is coming in for recheck of his hyperlipidemia. The patient is currently taking no medication currently, has been intolerant of statins) yeast rice extract, trying diet. They deny any issues with myalgias or history of liver damage from it. They deny any focal numbness or weakness or chest pain.   COPD Patient is coming in for COPD recheck today.  He is currently on Spiriva and uses albuterol very infrequently.  He has a mild chronic cough but denies any major coughing spells or wheezing spells.  He has 1nighttime symptoms per week and 2daytime symptoms per week currently.  Patient admits to still smoking, she is trying to quit but not successfully.  She is not wanting help right now  Relevant past medical, surgical, family and social history reviewed and updated as indicated. Interim medical history since our last visit reviewed. Allergies and medications reviewed and updated.  Review of Systems  Constitutional:  Negative for chills and fever.  HENT:  Negative for congestion, ear discharge and ear pain.   Eyes:  Negative for redness and visual  disturbance.  Respiratory:  Positive for cough and wheezing. Negative for chest tightness and shortness of breath.   Cardiovascular:  Negative for chest pain and leg swelling.  Genitourinary:  Negative for difficulty urinating and dysuria.  Musculoskeletal:  Negative for back pain and gait problem.  Skin:  Negative for rash.  Neurological:  Positive for numbness (Patient still has an occasional numbing and tingling in her fingers especially at night or when she is on the computer.). Negative for light-headedness and headaches.  Psychiatric/Behavioral:  Negative for agitation and behavioral problems.   All other systems reviewed and are negative.   Per HPI unless specifically indicated above   Allergies as of 01/23/2022       Reactions   Diphenhydramine Hcl Anaphylaxis   Morphine Rash   Other   Statins Other (See Comments)   Muscle cramps/spasms, Severe skin rash    Red Yeast Rice [cholestin] Rash   Triclosan Rash   Other    Bleach - contact dermatitis   Benzocaine Rash   Latex Rash   Sulfa Antibiotics Rash   Muscle cramps.        Medication List        Accurate as of January 23, 2022 11:23 AM. If you have any questions, ask your nurse or doctor.          albuterol 108 (90 Base) MCG/ACT inhaler Commonly known as: VENTOLIN HFA Inhale 1-2 puffs into the lungs every 6 (six) hours as needed for wheezing or shortness of breath.  aspirin EC 81 MG tablet Take 81 mg by mouth daily.   CALCIUM 1200 PO Take 1,200 mg by mouth daily.   cetirizine 10 MG tablet Commonly known as: ZYRTEC Take 10 mg by mouth daily as needed.   cholecalciferol 1000 units tablet Commonly known as: VITAMIN D Take 2,000 Units by mouth daily.   clobetasol ointment 0.05 % Commonly known as: TEMOVATE APPLY EXTERNALLY TO THE AFFECTED AREA TWICE DAILY   diazepam 5 MG tablet Commonly known as: Valium One every 6 hours for muscle spasm   ibuprofen 800 MG tablet Commonly known as: ADVIL Take  800 mg by mouth every 8 (eight) hours as needed.   irbesartan 150 MG tablet Commonly known as: AVAPRO TAKE 1 TABLET BY MOUTH DAILY   Spiriva Respimat 2.5 MCG/ACT Aers Generic drug: Tiotropium Bromide Monohydrate Inhale 2 puffs into the lungs 2 (two) times daily.   triamcinolone 55 MCG/ACT Aero nasal inhaler Commonly known as: NASACORT Place 2 sprays into the nose daily as needed (allergies).   TURMERIC CURCUMIN PO Take 1 tablet by mouth daily.         Objective:   BP 138/75   Pulse 70   Temp 98 F (36.7 C)   Ht 5' 3"  (1.6 m)   Wt 169 lb (76.7 kg)   SpO2 98%   BMI 29.94 kg/m   Wt Readings from Last 3 Encounters:  01/23/22 169 lb (76.7 kg)  01/31/21 175 lb (79.4 kg)  01/21/21 175 lb (79.4 kg)    Physical Exam Vitals and nursing note reviewed.  Constitutional:      General: She is not in acute distress.    Appearance: She is well-developed. She is not diaphoretic.  HENT:     Right Ear: Tympanic membrane and ear canal normal.     Left Ear: Tympanic membrane and ear canal normal.     Mouth/Throat:     Mouth: Mucous membranes are moist.     Pharynx: Oropharynx is clear. No oropharyngeal exudate or posterior oropharyngeal erythema.  Eyes:     Conjunctiva/sclera: Conjunctivae normal.  Cardiovascular:     Rate and Rhythm: Normal rate and regular rhythm.     Heart sounds: Normal heart sounds. No murmur heard. Pulmonary:     Effort: Pulmonary effort is normal. No respiratory distress.     Breath sounds: Wheezing (Wheeze on upper lobes bilateral end expiratory) present.  Abdominal:     General: Abdomen is flat. Bowel sounds are normal. There is no distension.     Palpations: Abdomen is soft.     Tenderness: There is no abdominal tenderness. There is no guarding or rebound.  Musculoskeletal:        General: No swelling or tenderness. Normal range of motion.  Skin:    General: Skin is warm and dry.     Findings: No rash.  Neurological:     Mental Status: She is  alert and oriented to person, place, and time.     Coordination: Coordination normal.  Psychiatric:        Behavior: Behavior normal.       Assessment & Plan:   Problem List Items Addressed This Visit       Cardiovascular and Mediastinum   HTN (hypertension)   Relevant Orders   CMP14+EGFR     Respiratory   COPD  GOLD III    Relevant Orders   CBC with Differential/Platelet     Other   Hyperlipidemia   Relevant Orders   Lipid  panel   Other Visit Diagnoses     Well adult exam    -  Primary   Relevant Orders   CBC with Differential/Platelet   CMP14+EGFR   Lipid panel   Neuropathy           Patient is going to talk to her orthopedic and see if they do that neuropathic testing  She is will schedule her mammogram as well.  Blood pressure and heart rate looks good today.  We will see what her blood work does. Follow up plan: Return in about 1 year (around 01/24/2023), or if symptoms worsen or fail to improve, for Physical exam.  Counseling provided for all of the vaccine components Orders Placed This Encounter  Procedures   CBC with Differential/Platelet   CMP14+EGFR   Lipid panel    Caryl Pina, MD [facility] 01/23/2022, 11:23 AM

## 2022-01-24 LAB — CMP14+EGFR
ALT: 13 IU/L (ref 0–32)
AST: 12 IU/L (ref 0–40)
Albumin/Globulin Ratio: 2 (ref 1.2–2.2)
Albumin: 4.3 g/dL (ref 3.8–4.8)
Alkaline Phosphatase: 91 IU/L (ref 44–121)
BUN/Creatinine Ratio: 21 (ref 12–28)
BUN: 13 mg/dL (ref 8–27)
Bilirubin Total: 0.3 mg/dL (ref 0.0–1.2)
CO2: 23 mmol/L (ref 20–29)
Calcium: 9.6 mg/dL (ref 8.7–10.3)
Chloride: 108 mmol/L — ABNORMAL HIGH (ref 96–106)
Creatinine, Ser: 0.61 mg/dL (ref 0.57–1.00)
Globulin, Total: 2.1 g/dL (ref 1.5–4.5)
Glucose: 85 mg/dL (ref 70–99)
Potassium: 4.2 mmol/L (ref 3.5–5.2)
Sodium: 144 mmol/L (ref 134–144)
Total Protein: 6.4 g/dL (ref 6.0–8.5)
eGFR: 95 mL/min/{1.73_m2} (ref >59)

## 2022-01-24 LAB — CBC WITH DIFFERENTIAL/PLATELET
Basophils Absolute: 0 10*3/uL (ref 0.0–0.2)
Basos: 1 %
EOS (ABSOLUTE): 0.2 10*3/uL (ref 0.0–0.4)
Eos: 3 %
Hematocrit: 43.9 % (ref 34.0–46.6)
Hemoglobin: 14.6 g/dL (ref 11.1–15.9)
Immature Grans (Abs): 0 10*3/uL (ref 0.0–0.1)
Immature Granulocytes: 0 %
Lymphocytes Absolute: 2.1 10*3/uL (ref 0.7–3.1)
Lymphs: 31 %
MCH: 30.7 pg (ref 26.6–33.0)
MCHC: 33.3 g/dL (ref 31.5–35.7)
MCV: 92 fL (ref 79–97)
Monocytes Absolute: 0.7 10*3/uL (ref 0.1–0.9)
Monocytes: 10 %
Neutrophils Absolute: 3.8 10*3/uL (ref 1.4–7.0)
Neutrophils: 55 %
Platelets: 197 10*3/uL (ref 150–450)
RBC: 4.75 x10E6/uL (ref 3.77–5.28)
RDW: 12 % (ref 11.7–15.4)
WBC: 6.8 10*3/uL (ref 3.4–10.8)

## 2022-01-24 LAB — LIPID PANEL
Chol/HDL Ratio: 5.4 ratio — ABNORMAL HIGH (ref 0.0–4.4)
Cholesterol, Total: 276 mg/dL — ABNORMAL HIGH (ref 100–199)
HDL: 51 mg/dL (ref >39)
LDL Chol Calc (NIH): 195 mg/dL — ABNORMAL HIGH (ref 0–99)
Triglycerides: 161 mg/dL — ABNORMAL HIGH (ref 0–149)
VLDL Cholesterol Cal: 30 mg/dL (ref 5–40)

## 2022-02-01 ENCOUNTER — Ambulatory Visit: Payer: Medicare Other

## 2022-02-01 ENCOUNTER — Other Ambulatory Visit: Payer: Self-pay

## 2022-02-01 DIAGNOSIS — E7800 Pure hypercholesterolemia, unspecified: Secondary | ICD-10-CM

## 2022-02-07 ENCOUNTER — Telehealth: Payer: Self-pay

## 2022-02-07 NOTE — Chronic Care Management (AMB) (Signed)
  Chronic Care Management   Note  02/07/2022 Name: Erin Guerrero MRN: [id number] DOB: 1951-02-01  [name] is a 71 y.o. year old female who is a primary care patient of Dettinger, Fransisca Kaufmann, MD. I reached out to [name] by phone today in response to a referral sent by Ms. Hosford PCP.  Ms. Craun was given information about Chronic Care Management services today including:  CCM service includes personalized support from designated clinical staff supervised by her physician, including individualized plan of care and coordination with other care providers 24/7 contact phone numbers for assistance for urgent and routine care needs. Service will only be billed when office clinical staff spend 20 minutes or more in a month to coordinate care. Only one practitioner may furnish and bill the service in a calendar month. The patient may stop CCM services at any time (effective at the end of the month) by phone call to the office staff. The patient is responsible for co-pay (up to 20% after annual deductible is met) if co-pay is required by the individual health plan.   Patient agreed to services and verbal consent obtained.   Follow up plan: Face to Face appointment with care management team member scheduled for: 03/24/2022  Noreene Larsson, [place], [place] [address] Direct Dial: [phone] [email or web address]

## 2022-02-14 ENCOUNTER — Ambulatory Visit: Payer: Medicare Other | Admitting: Orthopaedic Surgery

## 2022-02-15 ENCOUNTER — Ambulatory Visit: Payer: Medicare Other | Admitting: Orthopaedic Surgery

## 2022-02-15 ENCOUNTER — Encounter: Payer: Self-pay | Admitting: Orthopaedic Surgery

## 2022-02-15 DIAGNOSIS — R200 Anesthesia of skin: Secondary | ICD-10-CM

## 2022-02-15 DIAGNOSIS — G5603 Carpal tunnel syndrome, bilateral upper limbs: Secondary | ICD-10-CM | POA: Diagnosis not present

## 2022-02-15 NOTE — Progress Notes (Signed)
Office Visit Note   Patient: Erin Guerrero           Date of Birth: 1951/05/03           MRN: [id number] Visit Date: 02/15/2022              Requested by: Dettinger, Fransisca Kaufmann, MD [place],  [place] [address] PCP: Dettinger, Fransisca Kaufmann, MD   Assessment & Plan: Visit Diagnoses:  1. Bilateral hand numbness   2. 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  She has a 2-year history of increasing numbness in her hands and pain that awakens her at night.  She is right-hand dominant.  She says this is only getting worse.  Numbness is most present in the index and long finger but also has it in her thumb.  She has not had any nerve conduction studies.  Certainly has evidence of bilateral carpal tunnel.  We will obtain EMGs nerve conduction studies with Dr. Ernestina Patches and then have her return shortly thereafter she does have splints that she can wear at night.  No related neck issues Follow-Up Instructions: Return after nerve conduction studies.   Orders:  Orders Placed This Encounter  Procedures   Ambulatory referral to Physical Medicine Rehab   No orders of the defined types were placed in this encounter.     Procedures: No procedures performed   Clinical Data: No additional findings.   Subjective: Chief Complaint  Patient presents with   Right Hand - Pain   Left Hand - Pain  Patient presents today for bilateral hand pain. She said that she has numbness in both hands that wakes her at night. She said that both are equally as bad. She was told in the past that it was coming from her elbows, but bracing did not help.   HPI  Review of Systems  All other systems reviewed and are negative.    Objective: Vital Signs: There were no vitals taken for this visit.  Physical Exam Constitutional:      Appearance: Normal appearance.  Pulmonary:     Effort: Pulmonary effort is normal.   Skin:    General: Skin is warm and dry.  Neurological:     Mental Status: She is alert.     Ortho Exam Examination of her bilateral hands she has no obvious thenar atrophy.  Pulses are intact bilaterally she has brisk capillary refill in both of her hands no redness no erythema.  She has fairly strong strength with opposition of her thumb to her other fingers.  No pain in her elbows.  Negative Tinel's and Phalen's Specialty Comments:  No specialty comments available.  Imaging: No results found.   PMFS History: Patient Active Problem List   Diagnosis Date Noted   Carpal tunnel syndrome, bilateral upper limbs 02/15/2022   Allergic drug reaction 02/12/2018   Obesity (BMI 30.0-34.9) 12/31/2017   Osteopenia of necks of both femurs 01/25/2017   Solitary pulmonary nodule 08/11/2016   COPD  GOLD III  08/10/2016   HTN (hypertension)    Cigarette smoker    History of colonic polyps 02/25/2016   Benign neoplasm breast 01/22/2013   Hyperlipidemia 10/22/2012   Past Medical History:  Diagnosis Date   Acute respiratory failure ([place])    CAP (community acquired pneumonia)    Cataract    Chronic bronchitis ([place])  COPD (chronic obstructive pulmonary disease) ([facility])    HTN (hypertension)    Hyperlipidemia    Influenza    Osteoarthritis    Pulmonary nodule    Seasonal allergies    Tobacco use     Family History  Problem Relation Age of Onset   Cancer Mother 88       Question cervical/ovarian   Arthritis Mother    Kidney failure Sister 69       born with one kidney, other kidney injured by mva   Kidney failure Sister    Lupus Sister    Diabetes Sister    Thyroid cancer Daughter    Arthritis Brother    Hearing loss Brother    Scoliosis Brother    Celiac disease Neg Hx    Inflammatory bowel disease Neg Hx     Past Surgical History:  Procedure Laterality Date   BREAST BIOPSY  2014   Benign   Cervical cryo-treatment  1972   CHOLECYSTECTOMY  1998   COLONOSCOPY     Polyps    COLONOSCOPY N/A 03/16/2016   Procedure: COLONOSCOPY;  Surgeon: Daneil Dolin, MD;  Location: [facility];  Service: Endoscopy;  Laterality: N/A;  1:30 PM   DILATION AND CURETTAGE OF UTERUS     GANGLION CYST EXCISION  1970   Left wrist   GANGLION CYST EXCISION     Right hand and left ring finger   [place]  2008   POLYPECTOMY  03/16/2016   Procedure: POLYPECTOMY;  Surgeon: Daneil Dolin, MD;  Location: [facility];  Service: Endoscopy;;  colon   ROTATOR CUFF REPAIR  2004   Right   TUBAL LIGATION     Social History   Occupational History   Occupation: PT for [employer], OR/ER nurse  Tobacco Use   Smoking status: Some Days    Packs/day: 0.25    Years: 50.00    Total pack years: 12.50    Types: Cigarettes   Smokeless tobacco: Never   Tobacco comments:    Smoker of 1 ppd x 50 years - quit 03/2017 - started back light smoker currently 4-5 per day  Vaping Use   Vaping Use: Never used  Substance and Sexual Activity   Alcohol use: No   Drug use: No   Sexual activity: Never

## 2022-02-28 ENCOUNTER — Ambulatory Visit: Payer: Medicare Other | Admitting: Physical Medicine and Rehabilitation

## 2022-02-28 DIAGNOSIS — R202 Paresthesia of skin: Secondary | ICD-10-CM | POA: Diagnosis not present

## 2022-03-01 NOTE — Procedures (Signed)
EMG & NCV Findings: Evaluation of the left median motor and the right median motor nerves showed prolonged distal onset latency (L5.0, R5.1 ms) and decreased conduction velocity (Elbow-Wrist, L48, R46 m/s).  The left median (across palm) sensory nerve showed prolonged distal peak latency (Wrist, 7.0 ms) and prolonged distal peak latency (Palm, 5.3 ms).  The right median (across palm) sensory nerve showed prolonged distal peak latency (Wrist, 13.5 ms), reduced amplitude (5.7 V), and prolonged distal peak latency (Palm, 4.9 ms).  All remaining nerves (as indicated in the following tables) were within normal limits.  All left vs. right side differences were within normal limits.    All examined muscles (as indicated in the following table) showed no evidence of electrical instability.    Impression: The above electrodiagnostic study is ABNORMAL and reveals evidence of a severe Bilateral median nerve entrapment at the wrist (carpal tunnel syndrome) affecting sensory and motor components. There is no significant electrodiagnostic evidence of any other focal nerve entrapment, brachial plexopathy or cervical radiculopathy.   Recommendations: 1.  Follow-up with referring physician. 2.  Continue current management of symptoms. 3.  Suggest surgical evaluation.  ___________________________ Laurence Spates FAAPMR Board Certified, American Board of Physical Medicine and Rehabilitation    Nerve Conduction Studies Anti Sensory Summary Table   Stim Site NR Peak (ms) Norm Peak (ms) P-T Amp (V) Norm P-T Amp Site1 Site2 Delta-P (ms) Dist (cm) Vel (m/s) Norm Vel (m/s)  Left Median Acr Palm Anti Sensory (2nd Digit)  32.5C  Wrist    *7.0 <3.6 12.0 >10 Wrist Palm 1.7 0.0    Palm    *5.3 <2.0 13.0         Right Median Acr Palm Anti Sensory (2nd Digit)  31.2C  Wrist    *13.5 <3.6 *5.7 >10 Wrist Palm 8.6 0.0    Palm    *4.9 <2.0 15.6         Left Radial Anti Sensory (Base 1st Digit)  31.7C  Wrist    2.1 <3.1  38.6  Wrist Base 1st Digit 2.1 0.0    Right Radial Anti Sensory (Base 1st Digit)  31.7C  Wrist    2.1 <3.1 22.1  Wrist Base 1st Digit 2.1 0.0    Left Ulnar Anti Sensory (5th Digit)  32.6C  Wrist    3.0 <3.7 18.2 >15.0 Wrist 5th Digit 3.0 14.0 47 >38  Right Ulnar Anti Sensory (5th Digit)  31.8C  Wrist    2.8 <3.7 22.1 >15.0 Wrist 5th Digit 2.8 14.0 50 >38   Motor Summary Table   Stim Site NR Onset (ms) Norm Onset (ms) O-P Amp (mV) Norm O-P Amp Site1 Site2 Delta-0 (ms) Dist (cm) Vel (m/s) Norm Vel (m/s)  Left Median Motor (Abd Poll Brev)  32.1C  Wrist    *5.0 <4.2 7.1 >5 Elbow Wrist 4.4 21.0 *48 >50  Elbow    9.4  6.6         Right Median Motor (Abd Poll Brev)  31.8C  Wrist    *5.1 <4.2 6.2 >5 Elbow Wrist 4.4 20.3 *46 >50  Elbow    9.5  5.9         Left Ulnar Motor (Abd Dig Min)  32.2C  Wrist    2.6 <4.2 7.6 >3 B Elbow Wrist 3.5 19.0 54 >53  B Elbow    6.1  7.3  A Elbow B Elbow 1.9 10.0 53 >53  A Elbow    8.0  7.2  Right Ulnar Motor (Abd Dig Min)  31.9C  Wrist    2.5 <4.2 9.8 >3 B Elbow Wrist 3.2 19.0 59 >53  B Elbow    5.7  9.7  A Elbow B Elbow 1.8 11.0 61 >53  A Elbow    7.5  7.8          EMG   Side Muscle Nerve Root Ins Act Fibs Psw Amp Dur Poly Recrt Int Fraser Din Comment  Right Abd Poll Brev Median C8-T1 Nml Nml Nml Nml Nml 0 Nml Nml   Right 1stDorInt Ulnar C8-T1 Nml Nml Nml Nml Nml 0 Nml Nml   Right PronatorTeres Median C6-7 Nml Nml Nml Nml Nml 0 Nml Nml   Right Biceps Musculocut C5-6 Nml Nml Nml Nml Nml 0 Nml Nml   Right Deltoid Axillary C5-6 Nml Nml Nml Nml Nml 0 Nml Nml     Nerve Conduction Studies Anti Sensory Left/Right Comparison   Stim Site L Lat (ms) R Lat (ms) L-R Lat (ms) L Amp (V) R Amp (V) L-R Amp (%) Site1 Site2 L Vel (m/s) R Vel (m/s) L-R Vel (m/s)  Median Acr Palm Anti Sensory (2nd Digit)  32.5C  Wrist *7.0 *13.5 6.5 12.0 *5.7 52.5 Wrist Palm     Palm *5.3 *4.9 0.4 13.0 15.6 16.7       Radial Anti Sensory (Base 1st Digit)  31.7C  Wrist 2.1  2.1 0.0 38.6 22.1 42.7 Wrist Base 1st Digit     Ulnar Anti Sensory (5th Digit)  32.6C  Wrist 3.0 2.8 0.2 18.2 22.1 17.6 Wrist 5th Digit 47 50 3   Motor Left/Right Comparison   Stim Site L Lat (ms) R Lat (ms) L-R Lat (ms) L Amp (mV) R Amp (mV) L-R Amp (%) Site1 Site2 L Vel (m/s) R Vel (m/s) L-R Vel (m/s)  Median Motor (Abd Poll Brev)  32.1C  Wrist *5.0 *5.1 0.1 7.1 6.2 12.7 Elbow Wrist *48 *46 2  Elbow 9.4 9.5 0.1 6.6 5.9 10.6       Ulnar Motor (Abd Dig Min)  32.2C  Wrist 2.6 2.5 0.1 7.6 9.8 22.4 B Elbow Wrist 54 59 5  B Elbow 6.1 5.7 0.4 7.3 9.7 24.7 A Elbow B Elbow 53 61 8  A Elbow 8.0 7.5 0.5 7.2 7.8 7.7          Waveforms:

## 2022-03-03 NOTE — Progress Notes (Signed)
Erin Guerrero - 71 y.o. female MRN [id number]  Date of birth: 1951/03/12  Office Visit Note: Visit Date: 02/28/2022 PCP: Dettinger, Fransisca Kaufmann, MD Referred by: Garald Balding, MD  Subjective: Chief Complaint  Patient presents with   Left Hand - Pain, Numbness    1-3 fingers numb and painful to wake up, onset x 2 years, worse at night & using computer   Right Hand - Pain, Numbness   HPI:  Erin Guerrero is a 71 y.o. female who comes in today at the request of Dr. Joni Fears for electrodiagnostic study of the Bilateral upper extremities.  Patient is Right hand dominant.  She reports chronic worsening severe bilateral hand pain with numbness and tingling particularly in the radial digits.  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  She reports nocturnal complaints as well.  No prior electrodiagnostic studies.  No frank radicular symptoms.   ROS Otherwise per HPI.  Assessment & Plan: Visit Diagnoses:    ICD-10-CM   1. Paresthesia of skin  R20.2 NCV with EMG (electromyography)      Plan: Impression: The above electrodiagnostic study is ABNORMAL and reveals evidence of a severe Bilateral median nerve entrapment at the wrist (carpal tunnel syndrome) affecting sensory and motor components. There is no significant electrodiagnostic evidence of any other focal nerve entrapment, brachial plexopathy or cervical radiculopathy.   Recommendations: 1.  Follow-up with referring physician. 2.  Continue current management of symptoms. 3.  Suggest surgical evaluation.  Meds & Orders: No orders of the defined types were placed in this encounter.   Orders Placed This Encounter  Procedures   NCV with EMG (electromyography)    Follow-up: Return in about 2 weeks (around 03/14/2022) for Joni Fears, MD.   Procedures: No procedures performed  EMG & NCV Findings: Evaluation of the left median motor and the right median motor nerves showed  prolonged distal onset latency (L5.0, R5.1 ms) and decreased conduction velocity (Elbow-Wrist, L48, R46 m/s).  The left median (across palm) sensory nerve showed prolonged distal peak latency (Wrist, 7.0 ms) and prolonged distal peak latency (Palm, 5.3 ms).  The right median (across palm) sensory nerve showed prolonged distal peak latency (Wrist, 13.5 ms), reduced amplitude (5.7 V), and prolonged distal peak latency (Palm, 4.9 ms).  All remaining nerves (as indicated in the following tables) were within normal limits.  All left vs. right side differences were within normal limits.    All examined muscles (as indicated in the following table) showed no evidence of electrical instability.    Impression: The above electrodiagnostic study is ABNORMAL and reveals evidence of a severe Bilateral median nerve entrapment at the wrist (carpal tunnel syndrome) affecting sensory and motor components. There is no significant electrodiagnostic evidence of any other focal nerve entrapment, brachial plexopathy or cervical radiculopathy.   Recommendations: 1.  Follow-up with referring physician. 2.  Continue current management of symptoms. 3.  Suggest surgical evaluation.  ___________________________ Laurence Spates FAAPMR Board Certified, American Board of Physical Medicine and Rehabilitation    Nerve Conduction Studies Anti Sensory Summary Table   Stim Site NR Peak (ms) Norm Peak (ms) P-T Amp (V) Norm P-T Amp Site1 Site2 Delta-P (ms) Dist (cm) Vel (m/s) Norm Vel (m/s)  Left Median Acr Palm Anti Sensory (2nd Digit)  32.5C  Wrist    *7.0 <3.6 12.0 >10 Wrist Palm 1.7 0.0    Palm    *5.3 <2.0 13.0  Right Median Acr Palm Anti Sensory (2nd Digit)  31.2C  Wrist    *13.5 <3.6 *5.7 >10 Wrist Palm 8.6 0.0    Palm    *4.9 <2.0 15.6         Left Radial Anti Sensory (Base 1st Digit)  31.7C  Wrist    2.1 <3.1 38.6  Wrist Base 1st Digit 2.1 0.0    Right Radial Anti Sensory (Base 1st Digit)  31.7C  Wrist     2.1 <3.1 22.1  Wrist Base 1st Digit 2.1 0.0    Left Ulnar Anti Sensory (5th Digit)  32.6C  Wrist    3.0 <3.7 18.2 >15.0 Wrist 5th Digit 3.0 14.0 47 >38  Right Ulnar Anti Sensory (5th Digit)  31.8C  Wrist    2.8 <3.7 22.1 >15.0 Wrist 5th Digit 2.8 14.0 50 >38   Motor Summary Table   Stim Site NR Onset (ms) Norm Onset (ms) O-P Amp (mV) Norm O-P Amp Site1 Site2 Delta-0 (ms) Dist (cm) Vel (m/s) Norm Vel (m/s)  Left Median Motor (Abd Poll Brev)  32.1C  Wrist    *5.0 <4.2 7.1 >5 Elbow Wrist 4.4 21.0 *48 >50  Elbow    9.4  6.6         Right Median Motor (Abd Poll Brev)  31.8C  Wrist    *5.1 <4.2 6.2 >5 Elbow Wrist 4.4 20.3 *46 >50  Elbow    9.5  5.9         Left Ulnar Motor (Abd Dig Min)  32.2C  Wrist    2.6 <4.2 7.6 >3 B Elbow Wrist 3.5 19.0 54 >53  B Elbow    6.1  7.3  A Elbow B Elbow 1.9 10.0 53 >53  A Elbow    8.0  7.2         Right Ulnar Motor (Abd Dig Min)  31.9C  Wrist    2.5 <4.2 9.8 >3 B Elbow Wrist 3.2 19.0 59 >53  B Elbow    5.7  9.7  A Elbow B Elbow 1.8 11.0 61 >53  A Elbow    7.5  7.8          EMG   Side Muscle Nerve Root Ins Act Fibs Psw Amp Dur Poly Recrt Int Fraser Din Comment  Right Abd Poll Brev Median C8-T1 Nml Nml Nml Nml Nml 0 Nml Nml   Right 1stDorInt Ulnar C8-T1 Nml Nml Nml Nml Nml 0 Nml Nml   Right PronatorTeres Median C6-7 Nml Nml Nml Nml Nml 0 Nml Nml   Right Biceps Musculocut C5-6 Nml Nml Nml Nml Nml 0 Nml Nml   Right Deltoid Axillary C5-6 Nml Nml Nml Nml Nml 0 Nml Nml     Nerve Conduction Studies Anti Sensory Left/Right Comparison   Stim Site L Lat (ms) R Lat (ms) L-R Lat (ms) L Amp (V) R Amp (V) L-R Amp (%) Site1 Site2 L Vel (m/s) R Vel (m/s) L-R Vel (m/s)  Median Acr Palm Anti Sensory (2nd Digit)  32.5C  Wrist *7.0 *13.5 6.5 12.0 *5.7 52.5 Wrist Palm     Palm *5.3 *4.9 0.4 13.0 15.6 16.7       Radial Anti Sensory (Base 1st Digit)  31.7C  Wrist 2.1 2.1 0.0 38.6 22.1 42.7 Wrist Base 1st Digit     Ulnar Anti Sensory (5th Digit)  32.6C  Wrist 3.0 2.8  0.2 18.2 22.1 17.6 Wrist 5th Digit 47 50 3   Motor Left/Right Comparison   Stim Site  L Lat (ms) R Lat (ms) L-R Lat (ms) L Amp (mV) R Amp (mV) L-R Amp (%) Site1 Site2 L Vel (m/s) R Vel (m/s) L-R Vel (m/s)  Median Motor (Abd Poll Brev)  32.1C  Wrist *5.0 *5.1 0.1 7.1 6.2 12.7 Elbow Wrist *48 *46 2  Elbow 9.4 9.5 0.1 6.6 5.9 10.6       Ulnar Motor (Abd Dig Min)  32.2C  Wrist 2.6 2.5 0.1 7.6 9.8 22.4 B Elbow Wrist 54 59 5  B Elbow 6.1 5.7 0.4 7.3 9.7 24.7 A Elbow B Elbow 53 61 8  A Elbow 8.0 7.5 0.5 7.2 7.8 7.7          Waveforms:                      Clinical History: No specialty comments available.     Objective:  VS:  HT:    WT:   BMI:     BP:   HR: bpm  TEMP: ( )  RESP:  Physical Exam Musculoskeletal:        General: No swelling, tenderness or deformity.     Comments: Inspection reveals no atrophy of the bilateral APB or FDI or hand intrinsics. There is no swelling, color changes, allodynia or dystrophic changes. There is 5 out of 5 strength in the bilateral wrist extension, finger abduction and long finger flexion. There is intact sensation to light touch in all dermatomal and peripheral nerve distributions.  There is a negative Hoffmann's test bilaterally.  Skin:    General: Skin is warm and dry.     Findings: No erythema or rash.  Neurological:     General: No focal deficit present.     Mental Status: She is alert and oriented to person, place, and time.     Motor: No weakness or abnormal muscle tone.     Coordination: Coordination normal.  Psychiatric:        Mood and Affect: Mood normal.        Behavior: Behavior normal.      Imaging: No results found.

## 2022-03-15 ENCOUNTER — Ambulatory Visit: Payer: Medicare Other | Admitting: Orthopaedic Surgery

## 2022-03-15 ENCOUNTER — Encounter: Payer: Self-pay | Admitting: Orthopaedic Surgery

## 2022-03-15 DIAGNOSIS — G5603 Carpal tunnel syndrome, bilateral upper limbs: Secondary | ICD-10-CM | POA: Diagnosis not present

## 2022-03-15 NOTE — Progress Notes (Signed)
Office Visit Note   Patient: Erin Guerrero           Date of Birth: 22-Oct-1950           MRN: [id number] Visit Date: 03/15/2022              Requested by: Dettinger, Fransisca Kaufmann, MD [place],  [place] [id number] PCP: Dettinger, Fransisca Kaufmann, MD   Assessment & Plan: Visit Diagnoses:  1. Carpal tunnel syndrome, bilateral upper limbs     Plan: Dr. Ernestina Patches performed EMG and nerve conduction studies of bilateral upper extremities.  Results were consistent with severe bilateral median nerve entrapment affecting sensory and motor components.  There was no evidence of any other focal nerve entrapment, brachial plexopathy or cervical radiculopathy.  Long discussion regarding the above and we will proceed with surgical decompression of the more symptomatic dominant right hand.  Discussed the outpatient surgery, regional anesthesia and what she can expect postoperatively.  Discussed the potential complications including incomplete symptom relief as well as infection and even nerve injury.  We will proceed  Follow-Up Instructions: Return We will schedule outpatient right carpal tunnel release.   Orders:  No orders of the defined types were placed in this encounter.  No orders of the defined types were placed in this encounter.     Procedures: No procedures performed   Clinical Data: No additional findings.   Subjective: Chief Complaint  Patient presents with   Left Hand - Follow-up   Right Hand - Follow-up  Recently had bilateral upper extremity nerve conduction studies and EMGs by Dr. Ernestina Patches and here for the results.  No change in symptoms.  Continues to have numbness and tingling into the radial 3 digits of both hands.  Right-hand-dominant  HPI  Review of Systems   Objective: Vital Signs: There were no vitals taken for this visit.  Physical Exam Constitutional:      Appearance: She is well-developed.  Eyes:     Pupils: Pupils are equal, round, and reactive to light.   Pulmonary:     Effort: Pulmonary effort is normal.  Skin:    General: Skin is warm and dry.  Neurological:     Mental Status: She is alert and oriented to person, place, and time.  Psychiatric:        Behavior: Behavior normal.     Ortho Exam awake alert and oriented x3.  Comfortable sitting both hands were warm.  Full range of motion of digits.  Good opposition of thumb to little finger.  Positive Tinel's over the median nerve.  Some tingling subjectively to the radial 3 digits both hands.  Has evidence of carpal bossing in the dorsum of both hands but asymptomatic  Specialty Comments:  No specialty comments available.  Imaging: No results found.   PMFS History: Patient Active Problem List   Diagnosis Date Noted   Carpal tunnel syndrome, bilateral upper limbs 02/15/2022   Allergic drug reaction 02/12/2018   Obesity (BMI 30.0-34.9) 12/31/2017   Osteopenia of necks of both femurs 01/25/2017   Solitary pulmonary nodule 08/11/2016   COPD  GOLD III  08/10/2016   HTN (hypertension)    Cigarette smoker    History of colonic polyps 02/25/2016   Benign neoplasm breast 01/22/2013   Hyperlipidemia 10/22/2012   Past Medical History:  Diagnosis Date   Acute respiratory failure ([facility])    CAP (community acquired pneumonia)    Cataract    Chronic bronchitis ([facility])  COPD (chronic obstructive pulmonary disease) ([facility])    HTN (hypertension)    Hyperlipidemia    Influenza    Osteoarthritis    Pulmonary nodule    Seasonal allergies    Tobacco use     Family History  Problem Relation Age of Onset   Cancer Mother 60       Question cervical/ovarian   Arthritis Mother    Kidney failure Sister 70       born with one kidney, other kidney injured by mva   Kidney failure Sister    Lupus Sister    Diabetes Sister    Thyroid cancer Daughter    Arthritis Brother    Hearing loss Brother    Scoliosis Brother    Celiac disease Neg Hx    Inflammatory bowel disease Neg Hx     Past  Surgical History:  Procedure Laterality Date   BREAST BIOPSY  2014   Benign   Cervical cryo-treatment  1972   CHOLECYSTECTOMY  1998   COLONOSCOPY     Polyps   COLONOSCOPY N/A 03/16/2016   Procedure: COLONOSCOPY;  Surgeon: Daneil Dolin, MD;  [facility];  Service: Endoscopy;  Laterality: N/A;  1:30 PM   DILATION AND CURETTAGE OF UTERUS     GANGLION CYST EXCISION  1970   Left wrist   GANGLION CYST EXCISION     Right hand and left ring finger   [place]  2008   POLYPECTOMY  03/16/2016   Procedure: POLYPECTOMY;  Surgeon: Daneil Dolin, MD;  [facility];  Service: Endoscopy;;  colon   ROTATOR CUFF REPAIR  2004   Right   TUBAL LIGATION     Social History   Occupational History   Occupation: PT for [employer], OR/ER nurse  Tobacco Use   Smoking status: Some Days    Packs/day: 0.25    Years: 50.00    Total pack years: 12.50    Types: Cigarettes   Smokeless tobacco: Never   Tobacco comments:    Smoker of 1 ppd x 50 years - quit 03/2017 - started back light smoker currently 4-5 per day  Vaping Use   Vaping Use: Never used  Substance and Sexual Activity   Alcohol use: No   Drug use: No   Sexual activity: Never     Garald Balding, MD   Note - This record has been created using [employer].  Chart creation errors have been sought, but may not always  have been located. Such creation errors do not reflect on  the standard of medical care.

## 2022-03-23 ENCOUNTER — Other Ambulatory Visit: Payer: Self-pay | Admitting: Orthopaedic Surgery

## 2022-03-23 DIAGNOSIS — G5601 Carpal tunnel syndrome, right upper limb: Secondary | ICD-10-CM | POA: Diagnosis not present

## 2022-03-23 MED ORDER — TRAMADOL HCL 50 MG PO TABS: 50.0000 mg | ORAL_TABLET | Freq: Three times a day (TID) | ORAL | 0 refills | Status: AC | PRN

## 2022-03-24 ENCOUNTER — Ambulatory Visit: Payer: Medicare Other

## 2022-03-29 ENCOUNTER — Telehealth: Payer: Self-pay

## 2022-03-29 NOTE — Chronic Care Management (AMB) (Signed)
  Chronic Care Management Note  03/29/2022 Name: Erin Guerrero MRN: [id number] DOB: 09/24/1950  [name] is a 71 y.o. year old female who is a primary care patient of Dettinger, Fransisca Kaufmann, MD and is actively engaged with the care management team. I reached out to [name] by phone today to assist with re-scheduling an initial visit with the Pharmacist  Follow up plan: Unsuccessful telephone outreach attempt made. A HIPAA compliant phone message was left for the patient providing contact information and requesting a return call.  The care management team will reach out to the patient again over the next 7 days.  If patient returns call to provider office, please advise to call [facility] . at [address] [facility]  [place], [place] [address] Direct Dial: [phone] [email or web address]

## 2022-03-29 NOTE — Progress Notes (Signed)
Opened in error wrong context

## 2022-03-30 ENCOUNTER — Encounter: Payer: Self-pay | Admitting: Orthopaedic Surgery

## 2022-03-30 ENCOUNTER — Ambulatory Visit (INDEPENDENT_AMBULATORY_CARE_PROVIDER_SITE_OTHER): Payer: Medicare Other | Admitting: Orthopaedic Surgery

## 2022-03-30 DIAGNOSIS — G5603 Carpal tunnel syndrome, bilateral upper limbs: Secondary | ICD-10-CM

## 2022-03-30 NOTE — Progress Notes (Signed)
Office Visit Note   Patient: Erin Guerrero           Date of Birth: December 25, 1950           MRN: [id number] Visit Date: 03/30/2022              Requested by: Dettinger, Fransisca Kaufmann, MD [place],  [place] [address] PCP: Dettinger, Fransisca Kaufmann, MD   Assessment & Plan: Visit Diagnoses:  1. Carpal tunnel syndrome, bilateral upper limbs     Plan: Ms. Erich is 1 week status post right carpal tunnel release.  She is doing well and has no complaints.  The paresthesias she had in her hand are now gone.  She denies any pain and is only taken ibuprofen.  Her incision is healing well she has good control of her swelling.  Should continue to wear the brace when she is out and about.  Gave her a waterproof Band-Aid may shower and get the area wet.  We will follow-up in 1 week for suture removal  Follow-Up Instructions: Return in about 1 week (around 04/06/2022).   Orders:  No orders of the defined types were placed in this encounter.  No orders of the defined types were placed in this encounter.     Procedures: No procedures performed   Clinical Data: No additional findings.   Subjective: Chief Complaint  Patient presents with   Right Wrist - Follow-up    Right carpal tunnel release  Patient presents today for right carpal tunnel release 03/23/2022. She is now a week out from surgery. She is doing well.     Review of Systems  All other systems reviewed and are negative.    Objective: Vital Signs: There were no vitals taken for this visit.  Physical Exam Constitutional:      Appearance: Normal appearance.  Pulmonary:     Effort: Pulmonary effort is normal.  Neurological:     Mental Status: She is alert.     Ortho Exam Examination of her right wrist she has well apposed wound edges sutures are in place.  She has no surrounding erythema or swelling.  Pulses are intact she has good capillary refill in her digits.  She does have some ecchymosis in the volar forearm.   Sensation is intact.  She is able to move all of her fingers with ease Specialty Comments:  No specialty comments available.  Imaging: No results found.   PMFS History: Patient Active Problem List   Diagnosis Date Noted   Carpal tunnel syndrome, bilateral upper limbs 02/15/2022   Allergic drug reaction 02/12/2018   Obesity (BMI 30.0-34.9) 12/31/2017   Osteopenia of necks of both femurs 01/25/2017   Solitary pulmonary nodule 08/11/2016   COPD  GOLD III  08/10/2016   HTN (hypertension)    Cigarette smoker    History of colonic polyps 02/25/2016   Benign neoplasm breast 01/22/2013   Hyperlipidemia 10/22/2012   Past Medical History:  Diagnosis Date   Acute respiratory failure ([facility])    CAP (community acquired pneumonia)    Cataract    Chronic bronchitis ([facility])    COPD (chronic obstructive pulmonary disease) ([facility])    HTN (hypertension)    Hyperlipidemia    Influenza    Osteoarthritis    Pulmonary nodule    Seasonal allergies    Tobacco use     Family History  Problem Relation Age of Onset   Cancer Mother 42       Question  cervical/ovarian   Arthritis Mother    Kidney failure Sister 51       born with one kidney, other kidney injured by mva   Kidney failure Sister    Lupus Sister    Diabetes Sister    Thyroid cancer Daughter    Arthritis Brother    Hearing loss Brother    Scoliosis Brother    Celiac disease Neg Hx    Inflammatory bowel disease Neg Hx     Past Surgical History:  Procedure Laterality Date   BREAST BIOPSY  2014   Benign   Cervical cryo-treatment  1972   CHOLECYSTECTOMY  1998   COLONOSCOPY     Polyps   COLONOSCOPY N/A 03/16/2016   Procedure: COLONOSCOPY;  Surgeon: Daneil Dolin, MD;  [facility];  Service: Endoscopy;  Laterality: N/A;  1:30 PM   DILATION AND CURETTAGE OF UTERUS     GANGLION CYST EXCISION  1970   Left wrist   GANGLION CYST EXCISION     Right hand and left ring finger   [place]   2008   POLYPECTOMY  03/16/2016   Procedure: POLYPECTOMY;  Surgeon: Daneil Dolin, MD;  [facility];  Service: Endoscopy;;  colon   ROTATOR CUFF REPAIR  2004   Right   TUBAL LIGATION     Social History   Occupational History   Occupation: PT for [employer], OR/ER nurse  Tobacco Use   Smoking status: Some Days    Packs/day: 0.25    Years: 50.00    Total pack years: 12.50    Types: Cigarettes   Smokeless tobacco: Never   Tobacco comments:    Smoker of 1 ppd x 50 years - quit 03/2017 - started back light smoker currently 4-5 per day  Vaping Use   Vaping Use: Never used  Substance and Sexual Activity   Alcohol use: No   Drug use: No   Sexual activity: Never

## 2022-03-30 NOTE — Chronic Care Management (AMB) (Signed)
  Chronic Care Management Note  03/30/2022 Name: Erin Guerrero MRN: [id number] DOB: 06/05/1951  Erin Guerrero is a 71 y.o. year old female who is a primary care patient of Erin Guerrero, Erin Kaufmann, MD and is actively engaged with the care management team. I reached out to Erin Guerrero by phone today to assist with re-scheduling an initial visit with the Pharmacist  Follow up plan: Telephone appointment with care management team member scheduled for:04/20/2022  Noreene Larsson, [place], [place] [address] Direct Dial: [phone] [email or web address]

## 2022-04-06 ENCOUNTER — Ambulatory Visit (INDEPENDENT_AMBULATORY_CARE_PROVIDER_SITE_OTHER): Payer: Medicare Other | Admitting: Orthopaedic Surgery

## 2022-04-06 ENCOUNTER — Encounter: Payer: Self-pay | Admitting: Orthopaedic Surgery

## 2022-04-06 DIAGNOSIS — G5603 Carpal tunnel syndrome, bilateral upper limbs: Secondary | ICD-10-CM

## 2022-04-06 NOTE — Progress Notes (Signed)
Office Visit Note   Patient: Erin Guerrero           Date of Birth: [birth year]           MRN: [id number] Visit Date: 04/06/2022              Requested by: Dettinger, Fransisca Kaufmann, MD [place],  [place] [address] PCP: Dettinger, Fransisca Kaufmann, MD   Assessment & Plan: Visit Diagnoses:  1. Carpal tunnel syndrome, bilateral upper limbs     Plan: Ms. Groseclose is 2 weeks status post right carpal tunnel release.  She is doing very well and has gotten back to things she could not do because of her carpal tunnel syndrome.  Her incision is healing nicely.  Surgical sutures were removed today without difficulty she should still wear her soft splint for protection when she is out and about.  She also has carpal tunnel on her left wrist.  She would like to go forward and have this released.  We discussed this with her and as she has had the right side done she already understands the expectations risks and recovery  Follow-Up Instructions: Return in about 2 weeks (around 04/20/2022).   Orders:  No orders of the defined types were placed in this encounter.  No orders of the defined types were placed in this encounter.     Procedures: No procedures performed   Clinical Data: No additional findings.   Subjective: Chief Complaint  Patient presents with   Right Wrist - Follow-up    Right carpal tunnel release 03/23/2022  Patient presents today for follow up on her right wrist. She had carpal tunnel release on 03/23/2022. She is now 2 weeks out from surgery. She is doing well. No complaints.    Review of Systems   Objective: Vital Signs: There were no vitals taken for this visit.  Physical Exam Constitutional:      Appearance: Normal appearance.  Pulmonary:     Effort: Pulmonary effort is normal.  Skin:    General: Skin is warm and dry.  Neurological:     Mental Status: She is alert.     Ortho Exam Examination of her right wrist she has a well-healed surgical incision.   No erythema or warmth or swelling.  Radial pulse is intact.  She has brisk capillary refill in all of her digits and sensation is intact.  She is able to oppose her thumb to all of her fingers Specialty Comments:  No specialty comments available.  Imaging: No results found.   PMFS History: Patient Active Problem List   Diagnosis Date Noted   Carpal tunnel syndrome, bilateral upper limbs 02/15/2022   Allergic drug reaction 02/12/2018   Obesity (BMI 30.0-34.9) 12/31/2017   Osteopenia of necks of both femurs 01/25/2017   Solitary pulmonary nodule 08/11/2016   COPD  GOLD III  08/10/2016   HTN (hypertension)    Cigarette smoker    History of colonic polyps 02/25/2016   Benign neoplasm breast 01/22/2013   Hyperlipidemia 10/22/2012   Past Medical History:  Diagnosis Date   Acute respiratory failure ([facility])    CAP (community acquired pneumonia)    Cataract    Chronic bronchitis ([facility])    COPD (chronic obstructive pulmonary disease) (H[facility])    HTN (hypertension)    Hyperlipidemia    Influenza    Osteoarthritis    Pulmonary nodule    Seasonal allergies    Tobacco use     Family History  Problem Relation Age of Onset   Cancer Mother 55       Question cervical/ovarian   Arthritis Mother    Kidney failure Sister 55       born with one kidney, other kidney injured by mva   Kidney failure Sister    Lupus Sister    Diabetes Sister    Thyroid cancer Daughter    Arthritis Brother    Hearing loss Brother    Scoliosis Brother    Celiac disease Neg Hx    Inflammatory bowel disease Neg Hx     Past Surgical History:  Procedure Laterality Date   BREAST BIOPSY  2014   Benign   Cervical cryo-treatment  1972   CHOLECYSTECTOMY  1998   COLONOSCOPY     Polyps   COLONOSCOPY N/A 03/16/2016   Procedure: COLONOSCOPY;  Surgeon: Daneil Dolin, MD;  [facility];  Service: Endoscopy;  Laterality: N/A;  1:30 PM   DILATION AND CURETTAGE OF UTERUS     GANGLION CYST EXCISION  1970    Left wrist   GANGLION CYST EXCISION     Right hand and left ring finger   [place]  2008   POLYPECTOMY  03/16/2016   Procedure: POLYPECTOMY;  Surgeon: Daneil Dolin, MD;  [facility];  Service: Endoscopy;;  colon   ROTATOR CUFF REPAIR  2004   Right   TUBAL LIGATION     Social History   Occupational History   Occupation: PT for [employer], OR/ER nurse  Tobacco Use   Smoking status: Some Days    Packs/day: 0.25    Years: 50.00    Total pack years: 12.50    Types: Cigarettes   Smokeless tobacco: Never   Tobacco comments:    Smoker of 1 ppd x 50 years - quit 03/2017 - started back light smoker currently 4-5 per day  Vaping Use   Vaping Use: Never used  Substance and Sexual Activity   Alcohol use: No   Drug use: No   Sexual activity: Never

## 2022-04-11 ENCOUNTER — Other Ambulatory Visit: Payer: Self-pay | Admitting: Family Medicine

## 2022-04-11 DIAGNOSIS — I10 Essential (primary) hypertension: Secondary | ICD-10-CM

## 2022-04-13 ENCOUNTER — Ambulatory Visit (INDEPENDENT_AMBULATORY_CARE_PROVIDER_SITE_OTHER): Payer: Medicare Other | Admitting: Pharmacist

## 2022-04-13 DIAGNOSIS — F17210 Nicotine dependence, cigarettes, uncomplicated: Secondary | ICD-10-CM | POA: Diagnosis not present

## 2022-04-13 MED ORDER — VARENICLINE TARTRATE (STARTER) 0.5 MG X 11 & 1 MG X 42 PO TBPK: ORAL_TABLET | ORAL | 0 refills | Status: DC

## 2022-04-13 MED ORDER — VARENICLINE TARTRATE 1 MG PO TABS: 1.0000 mg | ORAL_TABLET | Freq: Two times a day (BID) | ORAL | 0 refills | Status: DC

## 2022-04-13 NOTE — Progress Notes (Signed)
04/13/2022 Name: Erin Guerrero MRN: [id number] DOB: [birth year]  Chief Complaint  Patient presents with   Hyperlipidemia   Nicotine Dependence     Subjective: 82 yoF referred to pharmacy clinic for hyperlipidemia.  Patient has a true documented allergy to statins and breaks out in a rash.  The goal of this visit is to find an alternative medication for her hyperlipidemia   Medication Access/Adherence  Current Pharmacy:  [facility] [place], [place] [address] [place] [address] Phone: [phone] Fax: [phone]  [facility] ([place], [place] [address] [place] [address] [place] [address] Phone: [phone] Fax: [place], [place] [address] Phone: [phone] Fax: [phone]   Patient reports affordability concerns with their medications: Yes ; Spiriva  Patient reports access/transportation concerns to their pharmacy: No  Patient reports adherence concerns with their medications:  No    Health Maintenance  Health Maintenance Due  Topic Date Due   COVID-19 Vaccine (5 - Pfizer risk series) 06/13/2021   Medicare Annual Wellness (AWV)  03/02/2022   DEXA SCAN  03/17/2022    Lab Results  Component Value Date   CREATININE 0.61 01/23/2022   BUN 13 01/23/2022   NA 144 01/23/2022   K 4.2 01/23/2022   CL 108 (H) 01/23/2022   CO2 23 01/23/2022    Lab Results  Component Value Date   CHOL 276 (H) 01/23/2022   HDL 51 01/23/2022   LDLCALC 195 (H) 01/23/2022   TRIG 161 (H) 01/23/2022   CHOLHDL 5.4 (H) 01/23/2022    Medications Reviewed Today     Reviewed by Lavera Guise, [facility] (Pharmacist) on 04/13/22 at 1151  Med List Status: <None>   Medication Order Taking? Sig Documenting Provider Last Dose Status Informant   albuterol (VENTOLIN HFA) 108 (90 Base) MCG/ACT inhaler [id number] No Inhale 1-2 puffs into the lungs every 6 (six) hours as needed for wheezing or shortness of breath. Dettinger, Fransisca Kaufmann, MD Taking Active   aspirin EC 81 MG tablet [id number] No Take 81 mg by mouth daily. [provider] Taking Active Self  Calcium Carbonate-Vit D-Min (CALCIUM 1200 PO) [id number] No Take 1,200 mg by mouth daily. [provider] Taking Active   cetirizine (ZYRTEC) 10 MG tablet [id number] No Take 10 mg by mouth daily as needed.  [provider] Taking Active Self           Med Note Belva Agee   Sat Aug 05, 2016  4:45 AM)    cholecalciferol (VITAMIN D) 1000 units tablet [id number] No Take 2,000 Units by mouth daily. [provider] Taking Active   clobetasol ointment (TEMOVATE) 0.05 % [id number] No APPLY EXTERNALLY TO THE AFFECTED AREA TWICE DAILY Hassell Done, Mary-Margaret, FNP Taking Active   diazepam (VALIUM) 5 MG tablet [id number] No One every 6 hours for muscle spasm Dettinger, Fransisca Kaufmann, MD Taking Active   ibuprofen (ADVIL,MOTRIN) 800 MG tablet [id number] No Take 800 mg by mouth every 8 (eight) hours as needed. [provider] Taking Active Self  irbesartan (AVAPRO) 150 MG tablet [id number]  TAKE 1 TABLET BY MOUTH DAILY Dettinger, Fransisca Kaufmann, MD  Active   Tiotropium Bromide Monohydrate (SPIRIVA RESPIMAT) 2.5 MCG/ACT AERS [id number] No Inhale 2  puffs into the lungs 2 (two) times daily. Dettinger, Fransisca Kaufmann, MD Taking Active   traMADol (ULTRAM) 50 MG tablet [id number] No Take 1 tablet (50 mg total) by mouth 3 (three) times daily as needed. Garald Balding, MD Taking Active   triamcinolone (NASACORT) 55 MCG/ACT AERO nasal inhaler [id number] No Place 2 sprays into the nose daily as needed (allergies).  [provider] Taking Active Self           Med Note Belva Agee   Sat Aug 05, 2016  4:45 AM)    TURMERIC CURCUMIN PO [id number] No Take 1 tablet by mouth daily.  [provider] Taking Active            Lipid Panel     Component Value Date/Time   CHOL 276 (H) 01/23/2022 1150   TRIG 161 (H) 01/23/2022 1150   HDL 51 01/23/2022 1150   CHOLHDL 5.4 (H) 01/23/2022 1150   LDLCALC 195 (H) 01/23/2022 1150   LABVLDL 30 01/23/2022 1150     Assessment/Plan:  Discussed nexletol and repatha for hyperlipidemia, however patient is not interested in medication at this time.  She will continue diet/lifestlye until PCP follow up.  We discussed the benefits and risks of LDL 195 and smoking status  Patient is interested in smoking cessation/spiriva is controlling her breathing at this time Counseled patient on Chantix Will call in starter pack with continuing month Discussed side effects and cost  Follow Up Plan: encouraged patient to reach out for additional support    Regina Eck, PharmD, BCPS Clinical Pharmacist, [place]  [facility] Phone [phone]

## 2022-04-20 ENCOUNTER — Telehealth: Payer: Medicare Other

## 2022-04-20 DIAGNOSIS — G5602 Carpal tunnel syndrome, left upper limb: Secondary | ICD-10-CM | POA: Diagnosis not present

## 2022-04-26 ENCOUNTER — Other Ambulatory Visit: Payer: Self-pay | Admitting: Family Medicine

## 2022-04-27 ENCOUNTER — Encounter: Payer: Self-pay | Admitting: Orthopaedic Surgery

## 2022-04-27 ENCOUNTER — Ambulatory Visit (INDEPENDENT_AMBULATORY_CARE_PROVIDER_SITE_OTHER): Payer: Medicare Other | Admitting: Orthopaedic Surgery

## 2022-04-27 DIAGNOSIS — G5603 Carpal tunnel syndrome, bilateral upper limbs: Secondary | ICD-10-CM

## 2022-04-27 NOTE — Progress Notes (Signed)
Office Visit Note   Patient: Erin Guerrero           Date of Birth: 01-29-1951           MRN: [id number] Visit Date: 04/27/2022              Requested by: Dettinger, Fransisca Kaufmann, MD [place],  [place] [id number] PCP: Dettinger, Fransisca Kaufmann, MD   Assessment & Plan: Visit Diagnoses:  1. Carpal tunnel syndrome, bilateral upper limbs     Plan: 1 week status post left carpal tunnel release and doing well.  Did have a bit more pain postoperatively with this and as compared to the right definitely can tell a difference in her pain and numbness and tingling after the release.  Wound is healing nicely.  We will apply waterproof Band-Aid and have her return in a week and remove the stitches.  I think the splint is causing her some problem with the intrinsic muscles so she can wear that infrequently and not as tight.  Follow-Up Instructions: Return in about 1 week (around 05/04/2022).   Orders:  No orders of the defined types were placed in this encounter.  No orders of the defined types were placed in this encounter.     Procedures: No procedures performed   Clinical Data: No additional findings.   Subjective: Chief Complaint  Patient presents with   Left Wrist - Routine Post Op  1 week status post left carpal tunnel release and doing well.  Did have a bit more pain for up to 48 hours after this procedure compared to the right carpal tunnel release but presently doing well.  She can already tell a difference in her hand but not as painful and does not have the numbness and tingling that she had preoperatively  HPI  Review of Systems   Objective: Vital Signs: There were no vitals taken for this visit.  Physical Exam  Ortho Exam left hand carpal tunnel incision healing without problem.  The wound was cleaned with alcohol and a waterproof Band-Aid applied.  No swelling of the digits.  Full fist and release.  Good opposition of thumb to little finger.  Normal sensation and  capillary refill to fingers  Specialty Comments:  No specialty comments available.  Imaging: No results found.   PMFS History: Patient Active Problem List   Diagnosis Date Noted   Carpal tunnel syndrome, bilateral upper limbs 02/15/2022   Allergic drug reaction 02/12/2018   Obesity (BMI 30.0-34.9) 12/31/2017   Osteopenia of necks of both femurs 01/25/2017   Solitary pulmonary nodule 08/11/2016   COPD  GOLD III  08/10/2016   HTN (hypertension)    Cigarette smoker    History of colonic polyps 02/25/2016   Benign neoplasm breast 01/22/2013   Hyperlipidemia 10/22/2012   Past Medical History:  Diagnosis Date   Acute respiratory failure ([place])    CAP (community acquired pneumonia)    Cataract    Chronic bronchitis (HCC)    COPD (chronic obstructive pulmonary disease) (HCC)    HTN (hypertension)    Hyperlipidemia    Influenza    Osteoarthritis    Pulmonary nodule    Seasonal allergies    Tobacco use     Family History  Problem Relation Age of Onset   Cancer Mother 48       Question cervical/ovarian   Arthritis Mother    Kidney failure Sister 83       born with one kidney,  other kidney injured by mva   Kidney failure Sister    Lupus Sister    Diabetes Sister    Thyroid cancer Daughter    Arthritis Brother    Hearing loss Brother    Scoliosis Brother    Celiac disease Neg Hx    Inflammatory bowel disease Neg Hx     Past Surgical History:  Procedure Laterality Date   BREAST BIOPSY  2014   Benign   Cervical cryo-treatment  1972   CHOLECYSTECTOMY  1998   COLONOSCOPY     Polyps   COLONOSCOPY N/A 03/16/2016   Procedure: COLONOSCOPY;  Surgeon: Daneil Dolin, MD;  [facility];  Service: Endoscopy;  Laterality: N/A;  1:30 PM   DILATION AND CURETTAGE OF UTERUS     GANGLION CYST EXCISION  1970   Left wrist   GANGLION CYST EXCISION     Right hand and left ring finger   [place]  2008   POLYPECTOMY  03/16/2016    Procedure: POLYPECTOMY;  Surgeon: Daneil Dolin, MD;  [facility];  Service: Endoscopy;;  colon   ROTATOR CUFF REPAIR  2004   Right   TUBAL LIGATION     Social History   Occupational History   Occupation: PT for [employer], OR/ER nurse  Tobacco Use   Smoking status: Some Days    Packs/day: 0.25    Years: 50.00    Total pack years: 12.50    Types: Cigarettes   Smokeless tobacco: Never   Tobacco comments:    Smoker of 1 ppd x 50 years - quit 03/2017 - started back light smoker currently 4-5 per day  Vaping Use   Vaping Use: Never used  Substance and Sexual Activity   Alcohol use: No   Drug use: No   Sexual activity: Never     Garald Balding, MD   Note - This record has been created using [employer].  Chart creation errors have been sought, but may not always  have been located. Such creation errors do not reflect on  the standard of medical care.

## 2022-05-01 ENCOUNTER — Other Ambulatory Visit (HOSPITAL_COMMUNITY): Payer: Self-pay | Admitting: Family Medicine

## 2022-05-01 DIAGNOSIS — Z1231 Encounter for screening mammogram for malignant neoplasm of breast: Secondary | ICD-10-CM

## 2022-05-04 ENCOUNTER — Ambulatory Visit (INDEPENDENT_AMBULATORY_CARE_PROVIDER_SITE_OTHER): Payer: Medicare Other | Admitting: Orthopaedic Surgery

## 2022-05-04 ENCOUNTER — Encounter: Payer: Self-pay | Admitting: Orthopaedic Surgery

## 2022-05-04 DIAGNOSIS — G5603 Carpal tunnel syndrome, bilateral upper limbs: Secondary | ICD-10-CM

## 2022-05-04 NOTE — Progress Notes (Signed)
Office Visit Note   Patient: Erin Guerrero           Date of Birth: 1950-12-11           MRN: [id number] Visit Date: 05/04/2022              Requested by: Dettinger, Fransisca Kaufmann, MD [place],  [place] [address] PCP: Dettinger, Fransisca Kaufmann, MD   Assessment & Plan: Visit Diagnoses:  1. Carpal tunnel syndrome, bilateral upper limbs     Plan: 2 weeks status post left carpal tunnel release and doing quite well.  Stitches were removed.  The wound is healed nicely.  No evidence of infection.  All of her preoperative symptoms with numbness tingling and pain have resolved.  Still having some discomfort along the hyperthenar musculature but relates it is "getting better".  She seems to ascribe this to the splint that she was wearing.  Ulnar nerve motor and sensory function is intact and excellent capillary refill to all of her fingers.  We will have her wear the splint when she is out and about for the next several weeks and have her return for 1 more visit in 3 weeks.  She is over a month status post right carpal tunnel release and doing very well from that standpoint as well  Follow-Up Instructions: Return in about 3 weeks (around 05/25/2022).   Orders:  No orders of the defined types were placed in this encounter.  No orders of the defined types were placed in this encounter.     Procedures: No procedures performed   Clinical Data: No additional findings.   Subjective: Chief Complaint  Patient presents with   Left Wrist - Follow-up    Left carpal tunnel release 04/20/2022  Patient presents today for follow up on her left wrist. She had carpal tunnel release on 04/20/2022. She is now 2 weeks out from surgery. She is doing well.   HPI  Review of Systems   Objective: Vital Signs: There were no vitals taken for this visit.  Physical Exam  Ortho Exam awake alert and oriented x3.  No pain with relative to the left hand now 2 weeks post carpal tunnel release.  Incision is  healing without problem.  Stitches removed.  Good opposition of thumb to little finger.  No swelling of digits is full range of motion.  Ulnar nerve function intact both motor and sensory good capillary refill to all fingers  Specialty Comments:  No specialty comments available.  Imaging: No results found.   PMFS History: Patient Active Problem List   Diagnosis Date Noted   Carpal tunnel syndrome, bilateral upper limbs 02/15/2022   Allergic drug reaction 02/12/2018   Obesity (BMI 30.0-34.9) 12/31/2017   Osteopenia of necks of both femurs 01/25/2017   Solitary pulmonary nodule 08/11/2016   COPD  GOLD III  08/10/2016   HTN (hypertension)    Cigarette smoker    History of colonic polyps 02/25/2016   Benign neoplasm breast 01/22/2013   Hyperlipidemia 10/22/2012   Past Medical History:  Diagnosis Date   Acute respiratory failure ([place])    CAP (community acquired pneumonia)    Cataract    Chronic bronchitis (HCC)    COPD (chronic obstructive pulmonary disease) (HCC)    HTN (hypertension)    Hyperlipidemia    Influenza    Osteoarthritis    Pulmonary nodule    Seasonal allergies    Tobacco use     Family History  Problem  Relation Age of Onset   Cancer Mother 46       Question cervical/ovarian   Arthritis Mother    Kidney failure Sister 63       born with one kidney, other kidney injured by mva   Kidney failure Sister    Lupus Sister    Diabetes Sister    Thyroid cancer Daughter    Arthritis Brother    Hearing loss Brother    Scoliosis Brother    Celiac disease Neg Hx    Inflammatory bowel disease Neg Hx     Past Surgical History:  Procedure Laterality Date   BREAST BIOPSY  2014   Benign   Cervical cryo-treatment  1972   CHOLECYSTECTOMY  1998   COLONOSCOPY     Polyps   COLONOSCOPY N/A 03/16/2016   Procedure: COLONOSCOPY;  Surgeon: Daneil Dolin, MD;  [facility];  Service: Endoscopy;  Laterality: N/A;  1:30 PM   DILATION AND CURETTAGE OF UTERUS      GANGLION CYST EXCISION  1970   Left wrist   GANGLION CYST EXCISION     Right hand and left ring finger   [place]  2008   POLYPECTOMY  03/16/2016   Procedure: POLYPECTOMY;  Surgeon: Daneil Dolin, MD;  [facility];  Service: Endoscopy;;  colon   ROTATOR CUFF REPAIR  2004   Right   TUBAL LIGATION     Social History   Occupational History   Occupation: PT for [employer], OR/ER nurse  Tobacco Use   Smoking status: Some Days    Packs/day: 0.25    Years: 50.00    Total pack years: 12.50    Types: Cigarettes   Smokeless tobacco: Never   Tobacco comments:    Smoker of 1 ppd x 50 years - quit 03/2017 - started back light smoker currently 4-5 per day  Vaping Use   Vaping Use: Never used  Substance and Sexual Activity   Alcohol use: No   Drug use: No   Sexual activity: Never

## 2022-05-12 ENCOUNTER — Ambulatory Visit (HOSPITAL_COMMUNITY)
Admission: RE | Admit: 2022-05-12 | Discharge: 2022-05-12 | Disposition: A | Discharge status: Home / Self Care | Payer: Medicare Other | Source: Ambulatory Visit | Attending: Family Medicine | Admitting: Family Medicine

## 2022-05-12 ENCOUNTER — Encounter (HOSPITAL_COMMUNITY): Payer: Self-pay

## 2022-05-12 DIAGNOSIS — Z1231 Encounter for screening mammogram for malignant neoplasm of breast: Secondary | ICD-10-CM | POA: Insufficient documentation

## 2022-05-25 ENCOUNTER — Ambulatory Visit (INDEPENDENT_AMBULATORY_CARE_PROVIDER_SITE_OTHER): Payer: Medicare Other | Admitting: Orthopaedic Surgery

## 2022-05-25 ENCOUNTER — Encounter: Payer: Self-pay | Admitting: Orthopaedic Surgery

## 2022-05-25 DIAGNOSIS — G5603 Carpal tunnel syndrome, bilateral upper limbs: Secondary | ICD-10-CM

## 2022-05-25 NOTE — Progress Notes (Signed)
Office Visit Note   Patient: Erin Guerrero           Date of Birth: [birth year]           MRN: [id number] Visit Date: 05/25/2022              Requested by: Dettinger, Fransisca Kaufmann, MD [place],  [place] [address] PCP: Dettinger, Fransisca Kaufmann, MD   Assessment & Plan: Visit Diagnoses:  1. Carpal tunnel syndrome, bilateral upper limbs     Plan: Adria is status post staged bilateral carpal tunnel releases and doing well.  She does not have any of the preoperative symptoms and, specifically, is not experiencing numbness tingling or pain at night.  She is not dropping any objects.  Still has a little loss of fine motor control with certain activities that she ascribes to the little and ring finger but ulnar nerve function appears to be intact.  Full range of motion of her fingers and good opposition of thumb to little finger.  Little bit of numbness along the right wrist scar but a lot of the thickness about both scars has resolved.  She is actually doing very well.  I would hope that over time most of her issues resolved she still is a bit weak which is also a common ending after median nerve decompression and which oftentimes resolves over a period of months and we will plan to see her back as needed  Follow-Up Instructions: Return if symptoms worsen or fail to improve.   Orders:  No orders of the defined types were placed in this encounter.  No orders of the defined types were placed in this encounter.     Procedures: No procedures performed   Clinical Data: No additional findings.   Subjective: Chief Complaint  Patient presents with   Right Wrist - Follow-up    Right carpal tunnel release 03/23/2022   Left Wrist - Follow-up    Left carpal tunnel release 04/20/2022  Patient presents today for follow up on both wrist. She had left carpal tunnel release on 04/20/2022, and right carpal tunnel release 03/23/2022. She states that both wrist are burning, and the fingers "don't feel  right".  However, relates that she does not have any compromise of any of her activities and she can "do anything".  She is not waking up at night and does not have any of her preoperative symptoms happy with the results  HPI  Review of Systems   Objective: Vital Signs: There were no vitals taken for this visit.  Physical Exam  Ortho Exam awake alert and oriented x 3.  Comfortable sitting.  Full range of motion of all the digits of both hands.  Good opposition of thumb and little finger.  There is carpal tunnel scars of healed nicely.  There is minimal thickness about the scars but not tender.  Little loss of sensation just along the right wrist scar both ulnarly and radially.  She has excellent opposition of thumb to little fingers and has normal abduction and had adduction of her fingers.  No Tinel's over the ulnar nerve or median nerve either hand normal capillary refill to fingers  Specialty Comments:  No specialty comments available.  Imaging: No results found.   PMFS History: Patient Active Problem List   Diagnosis Date Noted   Carpal tunnel syndrome, bilateral upper limbs 02/15/2022   Allergic drug reaction 02/12/2018   Obesity (BMI 30.0-34.9) 12/31/2017   Osteopenia of necks of  both femurs 01/25/2017   Solitary pulmonary nodule 08/11/2016   COPD  GOLD III  08/10/2016   HTN (hypertension)    Cigarette smoker    History of colonic polyps 02/25/2016   Benign neoplasm breast 01/22/2013   Hyperlipidemia 10/22/2012   Past Medical History:  Diagnosis Date   Acute respiratory failure ([facility])    CAP (community acquired pneumonia)    Cataract    Chronic bronchitis (H[facility])    COPD (chronic obstructive pulmonary disease) (HCC)    HTN (hypertension)    Hyperlipidemia    Influenza    Osteoarthritis    Pulmonary nodule    Seasonal allergies    Tobacco use     Family History  Problem Relation Age of Onset   Cancer Mother 22       Question cervical/ovarian   Arthritis Mother     Kidney failure Sister 73       born with one kidney, other kidney injured by mva   Kidney failure Sister    Lupus Sister    Diabetes Sister    Thyroid cancer Daughter    Arthritis Brother    Hearing loss Brother    Scoliosis Brother    Celiac disease Neg Hx    Inflammatory bowel disease Neg Hx     Past Surgical History:  Procedure Laterality Date   BREAST BIOPSY  2014   Benign   Cervical cryo-treatment  1972   CHOLECYSTECTOMY  1998   COLONOSCOPY     Polyps   COLONOSCOPY N/A 03/16/2016   Procedure: COLONOSCOPY;  Surgeon: Daneil Dolin, MD;  [facility];  Service: Endoscopy;  Laterality: N/A;  1:30 PM   DILATION AND CURETTAGE OF UTERUS     GANGLION CYST EXCISION  1970   Left wrist   GANGLION CYST EXCISION     Right hand and left ring finger   [place]  2008   POLYPECTOMY  03/16/2016   Procedure: POLYPECTOMY;  Surgeon: Daneil Dolin, MD;  [facility];  Service: Endoscopy;;  colon   ROTATOR CUFF REPAIR  2004   Right   TUBAL LIGATION     Social History   Occupational History   Occupation: PT for [employer], OR/ER nurse  Tobacco Use   Smoking status: Some Days    Packs/day: 0.25    Years: 50.00    Total pack years: 12.50    Types: Cigarettes   Smokeless tobacco: Never   Tobacco comments:    Smoker of 1 ppd x 50 years - quit 03/2017 - started back light smoker currently 4-5 per day  Vaping Use   Vaping Use: Never used  Substance and Sexual Activity   Alcohol use: No   Drug use: No   Sexual activity: Never

## 2022-07-11 DIAGNOSIS — H524 Presbyopia: Secondary | ICD-10-CM | POA: Diagnosis not present

## 2022-08-30 ENCOUNTER — Telehealth: Payer: Self-pay | Admitting: Family Medicine

## 2022-08-30 NOTE — Telephone Encounter (Signed)
Called patient to schedule Medicare Annual Wellness Visit (AWV). Left message for patient to call back and schedule Medicare Annual Wellness Visit (AWV).  Last date of AWV: 01/31/2021   Please schedule an appointment at any time with either Mickel Baas or [place], [facility]'s. .  If any questions, please contact me at [phone].  Thank you,  Colletta Maryland,  [place] Program Direct Dial ??[phone]

## 2022-09-26 ENCOUNTER — Telehealth: Payer: Self-pay | Admitting: Family Medicine

## 2022-09-26 NOTE — Telephone Encounter (Signed)
Contacted [name] to schedule their annual wellness visit. Appointment made for 10/02/2022.  Thank you,  Judeth Cornfield,  AMB Clinical Support [facility] AWV Program Direct Dial ??[phone]

## 2022-10-02 ENCOUNTER — Ambulatory Visit (INDEPENDENT_AMBULATORY_CARE_PROVIDER_SITE_OTHER): Payer: Medicare Other

## 2022-10-02 ENCOUNTER — Telehealth: Payer: Self-pay

## 2022-10-02 VITALS — Ht 63.0 in | Wt 169.0 lb

## 2022-10-02 DIAGNOSIS — Z0000 Encounter for general adult medical examination without abnormal findings: Secondary | ICD-10-CM

## 2022-10-02 NOTE — Telephone Encounter (Signed)
States that she has only been doing Spiriva once a day 2 puffs but rx says twice daily; also says that she only receives enough from the pharmacy for 2 puffs once daily. Please advise.

## 2022-10-02 NOTE — Progress Notes (Signed)
Subjective:   Erin Guerrero is a 72 y.o. female who presents for Medicare Annual (Subsequent) preventive examination.  I connected with  Erin Guerrero on 10/02/22 by a audio enabled telemedicine application and verified that I am speaking with the correct person using two identifiers.  Patient Location: Home  Provider Location: Home Office  I discussed the limitations of evaluation and management by telemedicine. The patient expressed understanding and agreed to proceed.  Review of Systems     Cardiac Risk Factors include: advanced age (>8men, >45 women);hypertension;dyslipidemia;smoking/ tobacco exposure     Objective:    Today's Vitals   10/02/22 1510  Weight: 169 lb (76.7 kg)  Height: 5\' 3"  (1.6 m)   Body mass index is 29.94 kg/m.     10/02/2022    3:24 PM 01/31/2021   10:41 AM 09/03/2016   10:25 AM 08/05/2016    9:16 AM 08/04/2016    8:39 PM 03/16/2016    7:34 AM  Advanced Directives  Does Patient Have a Medical Advance Directive? No No No No No No  Would patient like information on creating a medical advance directive? No - Patient declined No - Patient declined  No - Patient declined No - Patient declined No - patient declined information    Current Medications (verified) Outpatient Encounter Medications as of 10/02/2022  Medication Sig   albuterol (VENTOLIN HFA) 108 (90 Base) MCG/ACT inhaler Inhale 1-2 puffs into the lungs every 6 (six) hours as needed for wheezing or shortness of breath.   ascorbic acid (VITAMIN C) 500 MG tablet Take 500 mg by mouth daily.   aspirin EC 81 MG tablet Take 81 mg by mouth daily.   Calcium Carbonate-Vit D-Min (CALCIUM 1200 PO) Take 1,200 mg by mouth daily.   cetirizine (ZYRTEC) 10 MG tablet Take 10 mg by mouth daily as needed.    cholecalciferol (VITAMIN D) 1000 units tablet Take 2,000 Units by mouth daily.   clobetasol ointment (TEMOVATE) 0.05 % APPLY EXTERNALLY TO THE AFFECTED AREA TWICE DAILY   diazepam (VALIUM) 5 MG tablet  One every 6 hours for muscle spasm   ibuprofen (ADVIL,MOTRIN) 800 MG tablet Take 800 mg by mouth every 8 (eight) hours as needed.   irbesartan (AVAPRO) 150 MG tablet TAKE 1 TABLET BY MOUTH DAILY   SPIRIVA RESPIMAT 2.5 MCG/ACT AERS INHALE 2 PUFFS INTO THE LUNGS TWICE DAILY   traMADol (ULTRAM) 50 MG tablet Take 1 tablet (50 mg total) by mouth 3 (three) times daily as needed.   triamcinolone (NASACORT) 55 MCG/ACT AERO nasal inhaler Place 2 sprays into the nose daily as needed (allergies).    TURMERIC CURCUMIN PO Take 1 tablet by mouth daily.   varenicline (CHANTIX CONTINUING MONTH PAK) 1 MG tablet Take 1 tablet (1 mg total) by mouth 2 (two) times daily.   Varenicline Tartrate, Starter, (CHANTIX STARTING MONTH PAK) 0.5 MG X 11 & 1 MG X 42 TBPK 0.5 mg orally once daily for 3 days, then 0.5 mg twice daily on days 4 through 7, and then 1 mg twice daily thereafter for 12 weeks of treatment   No facility-administered encounter medications on file as of 10/02/2022.    Allergies (verified) Diphenhydramine hcl, Morphine, Statins, Red yeast rice [cholestin], Triclosan, Other, Benzocaine, Latex, and Sulfa antibiotics   History: Past Medical History:  Diagnosis Date   Acute respiratory failure    CAP (community acquired pneumonia)    Cataract    Chronic bronchitis    COPD (chronic obstructive pulmonary  disease)    HTN (hypertension)    Hyperlipidemia    Influenza    Osteoarthritis    Pulmonary nodule    Seasonal allergies    Tobacco use    Past Surgical History:  Procedure Laterality Date   BREAST BIOPSY  2014   Benign   Cervical cryo-treatment  1972   CHOLECYSTECTOMY  1998   COLONOSCOPY     Polyps   COLONOSCOPY N/A 03/16/2016   Procedure: COLONOSCOPY;  Surgeon: Corbin Ade, MD;  [facility];  Service: Endoscopy;  Laterality: N/A;  1:30 PM   DILATION AND CURETTAGE OF UTERUS     GANGLION CYST EXCISION  1970   Left wrist   GANGLION CYST EXCISION     Right hand and left ring  finger   LUMBAR LAMINECTOMY  1985, 1987   MENISCECTOMY  2008   POLYPECTOMY  03/16/2016   Procedure: POLYPECTOMY;  Surgeon: Corbin Ade, MD;  Location: [facility];  Service: Endoscopy;;  colon   ROTATOR CUFF REPAIR  2004   Right   TUBAL LIGATION     Family History  Problem Relation Age of Onset   Cancer Mother 71       Question cervical/ovarian   Arthritis Mother    Kidney failure Sister 26       born with one kidney, other kidney injured by mva   Kidney failure Sister    Lupus Sister    Diabetes Sister    Thyroid cancer Daughter    Arthritis Brother    Hearing loss Brother    Scoliosis Brother    Celiac disease Neg Hx    Inflammatory bowel disease Neg Hx    Social History   Socioeconomic History   Marital status: Widowed    Spouse name: Not on file   Number of children: 1   Years of education: Not on file   Highest education level: Not on file  Occupational History   Occupation: PT for [employer], OR/ER nurse  Tobacco Use   Smoking status: Some Days    Packs/day: 0.25    Years: 50.00    Additional pack years: 0.00    Total pack years: 12.50    Types: Cigarettes   Smokeless tobacco: Never   Tobacco comments:    Smoker of 1 ppd x 50 years - quit 03/2017 - started back light smoker currently 4-5 per day  Vaping Use   Vaping Use: Never used  Substance and Sexual Activity   Alcohol use: No   Drug use: No   Sexual activity: Never  Other Topics Concern   Not on file  Social History Narrative   Lives alone.   Daughter lives 20 miles away in [place].   Social Determinants of Health   Financial Resource Strain: Low Risk  (10/02/2022)   Overall Financial Resource Strain (CARDIA)    Difficulty of Paying Living Expenses: Not hard at all  Food Insecurity: No Food Insecurity (10/02/2022)   Hunger Vital Sign    Worried About Running Out of Food in the Last Year: Never true    Ran Out of Food in the Last Year: Never true  Transportation Needs: No Transportation Needs  (10/02/2022)   PRAPARE - Administrator, Civil Service (Medical): No    Lack of Transportation (Non-Medical): No  Physical Activity: Insufficiently Active (10/02/2022)   Exercise Vital Sign    Days of Exercise per Week: 3 days    Minutes of Exercise per  Session: 30 min  Stress: No Stress Concern Present (10/02/2022)   [employer] of Occupational Health - Occupational Stress Questionnaire    Feeling of Stress : Not at all  Social Connections: Socially Isolated (10/02/2022)   Social Connection and Isolation Panel [NHANES]    Frequency of Communication with Friends and Family: More than three times a week    Frequency of Social Gatherings with Friends and Family: Three times a week    Attends Religious Services: Never    Active Member of Clubs or Organizations: No    Attends Banker Meetings: Never    Marital Status: Widowed    Tobacco Counseling Ready to quit: Not Answered Counseling given: Not Answered Tobacco comments: Smoker of 1 ppd x 50 years - quit 03/2017 - started back light smoker currently 4-5 per day   Clinical Intake:  Pre-visit preparation completed: Yes  Pain : No/denies pain  Diabetes: No  How often do you need to have someone help you when you read instructions, pamphlets, or other written materials from your doctor or pharmacy?: 1 - Never  Diabetic?No   Interpreter Needed?: No  Information entered by :: Kandis Fantasia LPN   Activities of Daily Living    10/02/2022    3:24 PM  In your present state of health, do you have any difficulty performing the following activities:  Hearing? 0  Vision? 0  Difficulty concentrating or making decisions? 0  Walking or climbing stairs? 0  Dressing or bathing? 0  Doing errands, shopping? 0  Preparing Food and eating ? N  Using the Toilet? N  In the past six months, have you accidently leaked urine? N  Do you have problems with loss of bowel control? N  Managing your Medications? N   Managing your Finances? N  Housekeeping or managing your Housekeeping? N    Patient Care Team: Dettinger, Elige Radon, MD as PCP - General (Family Medicine) Jena Gauss Gerrit Friends, MD as Consulting Physician (Gastroenterology) Danella Maiers, [facility] as [facility] Care Management (Pharmacist) Desiree Lucy, OD (Optometry)  Indicate any recent Medical Services you may have received from other than [facility] providers in the past year (date may be approximate).     Assessment:   This is a routine wellness examination for [place].  Hearing/Vision screen Hearing Screening - Comments:: Denies hearing difficulties   Vision Screening - Comments:: Wears rx glasses - up to date with routine eye exams with Dr. Earlene Plater    Dietary issues and exercise activities discussed: Current Exercise Habits: Home exercise routine, Type of exercise: walking, Time (Minutes): 30, Frequency (Times/Week): 3, Weekly Exercise (Minutes/Week): 90, Intensity: Mild   Goals Addressed             This Visit's Progress    Remain active and independent        Depression Screen    10/02/2022    3:23 PM 01/23/2022   10:29 AM 01/31/2021   10:36 AM 01/02/2020   10:25 AM 02/12/2018    3:48 PM 12/31/2017    8:10 AM 07/02/2017    7:59 AM  PHQ 2/9 Scores  PHQ - 2 Score 0 0 0 0 0 0 0    Fall Risk    10/02/2022    3:24 PM 01/23/2022   10:29 AM 01/31/2021   10:41 AM 01/02/2020   10:25 AM 05/14/2019    9:39 AM  Fall Risk   Falls in the past year? 0 0 0 0 0  Comment  Emmi Telephone Survey: data to providers prior to load  Number falls in past yr: 0  0    Injury with Fall? 0  0    Risk for fall due to : No Fall Risks  Medication side effect    Follow up Falls prevention discussed;Education provided;Falls evaluation completed  Falls prevention discussed      FALL RISK PREVENTION PERTAINING TO THE HOME:  Any stairs in or around the home? No  If so, are there any without handrails? No  Home free of loose throw rugs  in walkways, pet beds, electrical cords, etc? Yes  Adequate lighting in your home to reduce risk of falls? Yes   ASSISTIVE DEVICES UTILIZED TO PREVENT FALLS:  Life alert? No  Use of a cane, walker or w/c? No  Grab bars in the bathroom? Yes  Shower chair or bench in shower? No  Elevated toilet seat or a handicapped toilet? Yes   TIMED UP AND GO:  Was the test performed? No . Telephonic visit   Cognitive Function:        10/02/2022    3:24 PM 01/31/2021   10:39 AM  6CIT Screen  What Year? 0 points 0 points  What month? 0 points 0 points  What time? 0 points 0 points  Count back from 20 0 points 0 points  Months in reverse 0 points 0 points  Repeat phrase 0 points 0 points  Total Score 0 points 0 points    Immunizations Immunization History  Administered Date(s) Administered   Influenza Whole 03/19/2016   Influenza, High Dose Seasonal PF 04/19/2017   Influenza,inj,Quad PF,6+ Mos 03/16/2022   PFIZER(Purple Top)SARS-COV-2 Vaccination 08/09/2019, 09/13/2019, 04/09/2020   PNEUMOCOCCAL CONJUGATE-20 01/23/2022   Pfizer Covid-19 Vaccine Bivalent Booster 30yrs & up 04/18/2021   Pneumococcal Conjugate-13 12/31/2017   Pneumococcal-Unspecified 07/20/2016   Tdap 01/04/2018    TDAP status: Up to date  Flu Vaccine status: Up to date  Pneumococcal vaccine status: Up to date  Covid-19 vaccine status: Information provided on how to obtain vaccines.   Qualifies for Shingles Vaccine? Yes   Zostavax completed No   Shingrix Completed?: No.    Education has been provided regarding the importance of this vaccine. Patient has been advised to call insurance company to determine out of pocket expense if they have not yet received this vaccine. Advised may also receive vaccine at local pharmacy or Health Dept. Verbalized acceptance and understanding.  Screening Tests Health Maintenance  Topic Date Due   Zoster Vaccines- Shingrix (1 of 2) Never done   COVID-19 Vaccine (5 - 2023-24  season) 02/17/2022   DEXA SCAN  03/17/2022   INFLUENZA VACCINE  01/18/2023   Medicare Annual Wellness (AWV)  10/02/2023   MAMMOGRAM  05/12/2024   COLONOSCOPY (Pts 45-67yrs Insurance coverage will need to be confirmed)  03/16/2026   DTaP/Tdap/Td (2 - Td or Tdap) 01/05/2028   Pneumonia Vaccine 44+ Years old  Completed   Hepatitis C Screening  Completed   HPV VACCINES  Aged Out    Health Maintenance  Health Maintenance Due  Topic Date Due   Zoster Vaccines- Shingrix (1 of 2) Never done   COVID-19 Vaccine (5 - 2023-24 season) 02/17/2022   DEXA SCAN  03/17/2022    Colorectal cancer screening: Type of screening: Colonoscopy. Completed 03/16/16. Repeat every 10 years  Mammogram status: Completed 05/12/22. Repeat every year  Bone Density status: Completed 03/17/20. Results reflect: Bone density results: OSTEOPENIA. Repeat every 2 years.  Lung Cancer  Screening: (Low Dose CT Chest recommended if Age 96-80 years, 30 pack-year currently smoking OR have quit w/in 15years.) does not qualify.   Lung Cancer Screening Referral: n/a  Additional Screening:  Hepatitis C Screening: does qualify; Completed 07/02/17  Vision Screening: Recommended annual ophthalmology exams for early detection of glaucoma and other disorders of the eye. Is the patient up to date with their annual eye exam?  Yes  Who is the provider or what is the name of the office in which the patient attends annual eye exams? Dr. Earlene Plater  If pt is not established with a provider, would they like to be referred to a provider to establish care? No .   Dental Screening: Recommended annual dental exams for proper oral hygiene  Community Resource Referral / Chronic Care Management: CRR required this visit?  No   CCM required this visit?  No      Plan:     I have personally reviewed and noted the following in the patient's chart:   Medical and social history Use of alcohol, tobacco or illicit drugs  Current medications and  supplements including opioid prescriptions. Patient is not currently taking opioid prescriptions. Functional ability and status Nutritional status Physical activity Advanced directives List of other physicians Hospitalizations, surgeries, and ER visits in previous 12 months Vitals Screenings to include cognitive, depression, and falls Referrals and appointments  In addition, I have reviewed and discussed with patient certain preventive protocols, quality metrics, and best practice recommendations. A written personalized care plan for preventive services as well as general preventive health recommendations were provided to patient.     Durwin Nora, [place]   [id number]   Due to this being a virtual visit, the after visit summary with patients personalized plan was offered to patient via mail or my-chart. Patient would like to access on my-chart/  Nurse Notes: No concerns

## 2022-10-02 NOTE — Patient Instructions (Signed)
Erin Guerrero , Thank you for taking time to come for your Medicare Wellness Visit. I appreciate your ongoing commitment to your health goals. Please review the following plan we discussed and let me know if I can assist you in the future.   These are the goals we discussed:  Goals      Exercise 3x per week (30 min per time)     Quit Smoking     Remain active and independent        This is a list of the screening recommended for you and due dates:  Health Maintenance  Topic Date Due   Zoster (Shingles) Vaccine (1 of 2) Never done   COVID-19 Vaccine (5 - 2023-24 season) 02/17/2022   DEXA scan (bone density measurement)  03/17/2022   Flu Shot  01/18/2023   Medicare Annual Wellness Visit  10/02/2023   Mammogram  05/12/2024   Colon Cancer Screening  03/16/2026   DTaP/Tdap/Td vaccine (2 - Td or Tdap) 01/05/2028   Pneumonia Vaccine  Completed   Hepatitis C Screening: USPSTF Recommendation to screen - Ages 64-79 yo.  Completed   HPV Vaccine  Aged Out    Advanced directives: Forms are available if you choose in the future to pursue completion.  This is recommended in order to make sure that your health wishes are honored in the event that you are unable to verbalize them to the provider.    Conditions/risks identified: Aim for 30 minutes of exercise or brisk walking, 6-8 glasses of water, and 5 servings of fruits and vegetables each day.   Next appointment: Follow up in one year for your annual wellness visit   I check with Dr. Louanne Skye about your Spiriva instructions and get back with you    Preventive Care 65 Years and Older, Female Preventive care refers to lifestyle choices and visits with your health care provider that can promote health and wellness. What does preventive care include? A yearly physical exam. This is also called an annual well check. Dental exams once or twice a year. Routine eye exams. Ask your health care provider how often you should have your eyes  checked. Personal lifestyle choices, including: Daily care of your teeth and gums. Regular physical activity. Eating a healthy diet. Avoiding tobacco and drug use. Limiting alcohol use. Practicing safe sex. Taking low-dose aspirin every day. Taking vitamin and mineral supplements as recommended by your health care provider. What happens during an annual well check? The services and screenings done by your health care provider during your annual well check will depend on your age, overall health, lifestyle risk factors, and family history of disease. Counseling  Your health care provider may ask you questions about your: Alcohol use. Tobacco use. Drug use. Emotional well-being. Home and relationship well-being. Sexual activity. Eating habits. History of falls. Memory and ability to understand (cognition). Work and work Astronomer. Reproductive health. Screening  You may have the following tests or measurements: Height, weight, and BMI. Blood pressure. Lipid and cholesterol levels. These may be checked every 5 years, or more frequently if you are over 60 years old. Skin check. Lung cancer screening. You may have this screening every year starting at age 22 if you have a 30-pack-year history of smoking and currently smoke or have quit within the past 15 years. Fecal occult blood test (FOBT) of the stool. You may have this test every year starting at age 37. Flexible sigmoidoscopy or colonoscopy. You may have a sigmoidoscopy every 5 years  or a colonoscopy every 10 years starting at age 51. Hepatitis C blood test. Hepatitis B blood test. Sexually transmitted disease (STD) testing. Diabetes screening. This is done by checking your blood sugar (glucose) after you have not eaten for a while (fasting). You may have this done every 1-3 years. Bone density scan. This is done to screen for osteoporosis. You may have this done starting at age 93. Mammogram. This may be done every 1-2  years. Talk to your health care provider about how often you should have regular mammograms. Talk with your health care provider about your test results, treatment options, and if necessary, the need for more tests. Vaccines  Your health care provider may recommend certain vaccines, such as: Influenza vaccine. This is recommended every year. Tetanus, diphtheria, and acellular pertussis (Tdap, Td) vaccine. You may need a Td booster every 10 years. Zoster vaccine. You may need this after age 62. Pneumococcal 13-valent conjugate (PCV13) vaccine. One dose is recommended after age 54. Pneumococcal polysaccharide (PPSV23) vaccine. One dose is recommended after age 7. Talk to your health care provider about which screenings and vaccines you need and how often you need them. This information is not intended to replace advice given to you by your health care provider. Make sure you discuss any questions you have with your health care provider. Document Released: 07/02/2015 Document Revised: 02/23/2016 Document Reviewed: 04/06/2015 Elsevier Interactive Patient Education  2017 [place] Prevention in the Home Falls can cause injuries. They can happen to people of all ages. There are many things you can do to make your home safe and to help prevent falls. What can I do on the outside of my home? Regularly fix the edges of walkways and driveways and fix any cracks. Remove anything that might make you trip as you walk through a door, such as a raised step or threshold. Trim any bushes or trees on the path to your home. Use bright outdoor lighting. Clear any walking paths of anything that might make someone trip, such as rocks or tools. Regularly check to see if handrails are loose or broken. Make sure that both sides of any steps have handrails. Any raised decks and porches should have guardrails on the edges. Have any leaves, snow, or ice cleared regularly. Use sand or salt on walking paths  during winter. Clean up any spills in your garage right away. This includes oil or grease spills. What can I do in the bathroom? Use night lights. Install grab bars by the toilet and in the tub and shower. Do not use towel bars as grab bars. Use non-skid mats or decals in the tub or shower. If you need to sit down in the shower, use a plastic, non-slip stool. Keep the floor dry. Clean up any water that spills on the floor as soon as it happens. Remove soap buildup in the tub or shower regularly. Attach bath mats securely with double-sided non-slip rug tape. Do not have throw rugs and other things on the floor that can make you trip. What can I do in the bedroom? Use night lights. Make sure that you have a light by your bed that is easy to reach. Do not use any sheets or blankets that are too big for your bed. They should not hang down onto the floor. Have a firm chair that has side arms. You can use this for support while you get dressed. Do not have throw rugs and other things on the floor  that can make you trip. What can I do in the kitchen? Clean up any spills right away. Avoid walking on wet floors. Keep items that you use a lot in easy-to-reach places. If you need to reach something above you, use a strong step stool that has a grab bar. Keep electrical cords out of the way. Do not use floor polish or wax that makes floors slippery. If you must use wax, use non-skid floor wax. Do not have throw rugs and other things on the floor that can make you trip. What can I do with my stairs? Do not leave any items on the stairs. Make sure that there are handrails on both sides of the stairs and use them. Fix handrails that are broken or loose. Make sure that handrails are as long as the stairways. Check any carpeting to make sure that it is firmly attached to the stairs. Fix any carpet that is loose or worn. Avoid having throw rugs at the top or bottom of the stairs. If you do have throw  rugs, attach them to the floor with carpet tape. Make sure that you have a light switch at the top of the stairs and the bottom of the stairs. If you do not have them, ask someone to add them for you. What else can I do to help prevent falls? Wear shoes that: Do not have high heels. Have rubber bottoms. Are comfortable and fit you well. Are closed at the toe. Do not wear sandals. If you use a stepladder: Make sure that it is fully opened. Do not climb a closed stepladder. Make sure that both sides of the stepladder are locked into place. Ask someone to hold it for you, if possible. Clearly mark and make sure that you can see: Any grab bars or handrails. First and last steps. Where the edge of each step is. Use tools that help you move around (mobility aids) if they are needed. These include: Canes. Walkers. Scooters. Crutches. Turn on the lights when you go into a dark area. Replace any light bulbs as soon as they burn out. Set up your furniture so you have a clear path. Avoid moving your furniture around. If any of your floors are uneven, fix them. If there are any pets around you, be aware of where they are. Review your medicines with your doctor. Some medicines can make you feel dizzy. This can increase your chance of falling. Ask your doctor what other things that you can do to help prevent falls. This information is not intended to replace advice given to you by your health care provider. Make sure you discuss any questions you have with your health care provider. Document Released: 04/01/2009 Document Revised: 11/11/2015 Document Reviewed: 07/10/2014 Elsevier Interactive Patient Education  2017 [employer].

## 2022-10-04 MED ORDER — SPIRIVA RESPIMAT 2.5 MCG/ACT IN AERS: 2.0000 | INHALATION_SPRAY | Freq: Two times a day (BID) | RESPIRATORY_TRACT | 5 refills | Status: DC

## 2022-10-04 NOTE — Telephone Encounter (Signed)
Appt made for 5/1 at 4:10. Pt aware.

## 2022-10-04 NOTE — Telephone Encounter (Signed)
Maybe we need to call the pharmacy and ask for sure what is going on if that is what it says on her prescription because according to the prescription that I sent over on 04/26/2022 it says 2 puffs twice a day.  If she needs a larger quantity then we need to clarify that with the pharmacy and change the prescription

## 2022-10-04 NOTE — Telephone Encounter (Signed)
Refills sent. Pt made aware. She states that they are giving her 4g and not 8g. Advised that she should call pharmacy to clarify because Dr. Louanne Skye since in #8g. Informed that it could be a insurance problem or a manufacturer problem.  Pt would also like a referral to neurology. She would like this because she has a lot of problems with her hands. Pt has had carpel tunnel surgery on both wrist. States that she is unable to grasp things. Also is unable to turn her head entirely and believes that her neck needs to be evaluated as well.  Informed pt that she may need an appt with Dettinger before the referral can be placed. Will verify this with Dettinger.

## 2022-10-04 NOTE — Telephone Encounter (Signed)
Yes she will have to make an appointment to discuss that issue, I do not know that there is something that we have discussed or if we had it has not been sometime.  Will have to discuss it so I can document the reason for the referral for the specialist.

## 2022-10-05 ENCOUNTER — Telehealth: Payer: Self-pay | Admitting: Family Medicine

## 2022-10-05 NOTE — Telephone Encounter (Signed)
Pt needs PA for Tiotropium Bromide Monohydrate (SPIRIVA RESPIMAT) 2.5 MCG/ACT AERS  use [facility] in [place]

## 2022-10-06 MED ORDER — SPIRIVA RESPIMAT 2.5 MCG/ACT IN AERS: 2.0000 | INHALATION_SPRAY | Freq: Every day | RESPIRATORY_TRACT | 3 refills | Status: DC

## 2022-10-06 NOTE — Telephone Encounter (Signed)
Information regarding your request   This medication or product is on your plan's list of covered drugs. Prior authorization is not required at this time. If your pharmacy has questions regarding the processing of your prescription, please have them call the [facility] pharmacy help desk at [phone]. **Please note: This request was submitted electronically. Formulary lowering, tiering exception, cost reduction and/or pre-benefit determination review (including prospective Medicare hospice reviews) requests cannot be requested using this method of submission. Providers contact us at [phone] for further assistance.  8g was denied but 4g is covered.  2 puffs daily  Resent for 4g 2 puffs daily

## 2022-10-18 ENCOUNTER — Ambulatory Visit (INDEPENDENT_AMBULATORY_CARE_PROVIDER_SITE_OTHER): Payer: Medicare Other | Admitting: Family Medicine

## 2022-10-18 ENCOUNTER — Ambulatory Visit: Payer: Medicare Other | Admitting: Family Medicine

## 2022-10-18 ENCOUNTER — Encounter: Payer: Self-pay | Admitting: Family Medicine

## 2022-10-18 VITALS — BP 129/66 | HR 88 | Ht 63.0 in | Wt 165.0 lb

## 2022-10-18 DIAGNOSIS — R29818 Other symptoms and signs involving the nervous system: Secondary | ICD-10-CM | POA: Diagnosis not present

## 2022-10-18 DIAGNOSIS — R29898 Other symptoms and signs involving the musculoskeletal system: Secondary | ICD-10-CM

## 2022-10-18 DIAGNOSIS — R449 Unspecified symptoms and signs involving general sensations and perceptions: Secondary | ICD-10-CM

## 2022-10-18 NOTE — Progress Notes (Signed)
BP 129/66   Pulse 88   Ht 5\' 3"  (1.6 m)   Wt 165 lb (74.8 kg)   SpO2 97%   BMI 29.23 kg/m    Subjective:   Patient ID: Erin Guerrero, female    DOB: 09-Oct-1950, 72 y.o.   MRN: [id number]  HPI: Erin Guerrero is a 73 y.o. female presenting on 10/18/2022 for Neck Pain and hand pain and numbness (Unable to grasp)   HPI Patient is coming in today with a few different issues. Her first major issue is that she is having decreased grip strength and fine touch sensation in her fingertips loss.  She did just have carpal tunnel surgery done 6 months ago by Dr. Cleophas Dunker who is now retired and she was doing great after the carpal tunnel surgery but then just recently over the past few weeks she started noticing that she is unable to grasp things and some of it because she cannot feel with her fingertips as much but that some of it is she feels like her grip strength is lost.  On top of all of this the other major issue that she is having is that when she walks she feels very quickly that her muscles in her legs are burning and aching and sore like if she had gone a long distance but it is only when she walks short distances.  She says this has been increasing over the past few months as well.  This is a new issue for her and she has not had it before.  Relevant past medical, surgical, family and social history reviewed and updated as indicated. Interim medical history since our last visit reviewed. Allergies and medications reviewed and updated.  Review of Systems  Constitutional:  Negative for chills and fever.  HENT:  Negative for congestion, ear discharge and ear pain.   Eyes:  Negative for redness and visual disturbance.  Respiratory:  Negative for chest tightness and shortness of breath.   Cardiovascular:  Negative for chest pain and leg swelling.  Genitourinary:  Negative for difficulty urinating and dysuria.  Musculoskeletal:  Positive for myalgias. Negative for back pain and gait  problem.  Skin:  Negative for rash.  Neurological:  Positive for weakness and numbness. Negative for light-headedness and headaches.  Psychiatric/Behavioral:  Negative for agitation and behavioral problems.   All other systems reviewed and are negative.   Per HPI unless specifically indicated above   Allergies as of 10/18/2022       Reactions   Diphenhydramine Hcl Anaphylaxis   Morphine Rash   Other   Statins Other (See Comments)   Muscle cramps/spasms, Severe skin rash    Red Yeast Rice [cholestin] Rash   Triclosan Rash   Other    Bleach - contact dermatitis   Benzocaine Rash   Latex Rash   Sulfa Antibiotics Rash   Muscle cramps.        Medication List        Accurate as of Oct 18, 2022  4:38 PM. If you have any questions, ask your nurse or doctor.          albuterol 108 (90 Base) MCG/ACT inhaler Commonly known as: VENTOLIN HFA Inhale 1-2 puffs into the lungs every 6 (six) hours as needed for wheezing or shortness of breath.   ascorbic acid 500 MG tablet Commonly known as: VITAMIN C Take 500 mg by mouth daily.   aspirin EC 81 MG tablet Take 81 mg by mouth daily.  CALCIUM 1200 PO Take 1,200 mg by mouth daily.   cetirizine 10 MG tablet Commonly known as: ZYRTEC Take 10 mg by mouth daily as needed.   cholecalciferol 1000 units tablet Commonly known as: VITAMIN D Take 2,000 Units by mouth daily.   clobetasol ointment 0.05 % Commonly known as: TEMOVATE APPLY EXTERNALLY TO THE AFFECTED AREA TWICE DAILY   diazepam 5 MG tablet Commonly known as: Valium One every 6 hours for muscle spasm   ibuprofen 800 MG tablet Commonly known as: ADVIL Take 800 mg by mouth every 8 (eight) hours as needed.   irbesartan 150 MG tablet Commonly known as: AVAPRO TAKE 1 TABLET BY MOUTH DAILY   Spiriva Respimat 2.5 MCG/ACT Aers Generic drug: Tiotropium Bromide Monohydrate Inhale 2 puffs into the lungs daily.   traMADol 50 MG tablet Commonly known as: ULTRAM Take 1  tablet (50 mg total) by mouth 3 (three) times daily as needed.   triamcinolone 55 MCG/ACT Aero nasal inhaler Commonly known as: NASACORT Place 2 sprays into the nose daily as needed (allergies).   TURMERIC CURCUMIN PO Take 1 tablet by mouth daily.   Varenicline Tartrate (Starter) 0.5 MG X 11 & 1 MG X 42 Tbpk Commonly known as: Chantix Starting Month Pak 0.5 mg orally once daily for 3 days, then 0.5 mg twice daily on days 4 through 7, and then 1 mg twice daily thereafter for 12 weeks of treatment   varenicline 1 MG tablet Commonly known as: Chantix Continuing Month Pak Take 1 tablet (1 mg total) by mouth 2 (two) times daily.         Objective:   BP 129/66   Pulse 88   Ht 5\' 3"  (1.6 m)   Wt 165 lb (74.8 kg)   SpO2 97%   BMI 29.23 kg/m   Wt Readings from Last 3 Encounters:  10/18/22 165 lb (74.8 kg)  10/02/22 169 lb (76.7 kg)  01/23/22 169 lb (76.7 kg)    Physical Exam Vitals and nursing note reviewed.  Constitutional:      General: She is not in acute distress.    Appearance: She is well-developed. She is not diaphoretic.  Eyes:     Conjunctiva/sclera: Conjunctivae normal.  Cardiovascular:     Rate and Rhythm: Normal rate and regular rhythm.     Heart sounds: Normal heart sounds. No murmur heard. Pulmonary:     Effort: Pulmonary effort is normal. No respiratory distress.     Breath sounds: Normal breath sounds. No wheezing.  Musculoskeletal:        General: Normal range of motion.     Right hand: No swelling, deformity or bony tenderness. Normal strength. Decreased sensation of the median distribution. Normal capillary refill. Normal pulse.     Left hand: No swelling, deformity or bony tenderness. Normal strength. Decreased sensation of the median distribution. Normal capillary refill. Normal pulse.     Cervical back: Tenderness (Paraspinal muscular tenderness) present. No bony tenderness. Pain with movement present.     Thoracic back: Normal.     Lumbar back:  Normal.     Right hip: Normal.     Left hip: Normal.     Right upper leg: Normal.     Left upper leg: Normal.     Right lower leg: Normal.     Left lower leg: Normal.  Skin:    General: Skin is warm and dry.     Findings: No rash.  Neurological:     Mental Status: She  is alert and oriented to person, place, and time.     Coordination: Coordination normal.  Psychiatric:        Behavior: Behavior normal.       Assessment & Plan:   Problem List Items Addressed This Visit   None Visit Diagnoses     Neurogenic claudication    -  Primary   Relevant Orders   Ambulatory referral to Orthopedic Surgery   Decreased grip strength       Relevant Orders   Ambulatory referral to Orthopedic Surgery   Focal sensory loss       Relevant Orders   Ambulatory referral to Orthopedic Surgery     Concern for possible return of her carpal tunnel versus neurogenic issues from her spine, will send to Ortho and see if they can do nerve conduction study again.  For her legs concern versus neurogenic claudication versus vascular claudication, we will send her for the nerve conduction test first and will discuss the possible circulation or vascular issue in the future.  Follow up plan: Return if symptoms worsen or fail to improve.  Counseling provided for all of the vaccine components Orders Placed This Encounter  Procedures   Ambulatory referral to Orthopedic Surgery    Arville Care, MD [facility] 10/18/2022, 4:38 PM

## 2022-10-20 ENCOUNTER — Telehealth: Payer: Self-pay | Admitting: Family Medicine

## 2022-10-20 NOTE — Telephone Encounter (Signed)
Pt called stating that she didn't want her Ortho referral sent to [facility]. She wants it sent to [facility].

## 2022-11-01 ENCOUNTER — Ambulatory Visit: Payer: Medicare Other | Admitting: Orthopaedic Surgery

## 2022-11-01 ENCOUNTER — Other Ambulatory Visit (INDEPENDENT_AMBULATORY_CARE_PROVIDER_SITE_OTHER): Payer: Medicare Other

## 2022-11-01 ENCOUNTER — Encounter: Payer: Self-pay | Admitting: Orthopaedic Surgery

## 2022-11-01 VITALS — Ht 63.0 in | Wt 165.0 lb

## 2022-11-01 DIAGNOSIS — G8929 Other chronic pain: Secondary | ICD-10-CM

## 2022-11-01 DIAGNOSIS — M545 Low back pain, unspecified: Secondary | ICD-10-CM | POA: Diagnosis not present

## 2022-11-01 DIAGNOSIS — M542 Cervicalgia: Secondary | ICD-10-CM | POA: Diagnosis not present

## 2022-11-01 DIAGNOSIS — M5136 Other intervertebral disc degeneration, lumbar region: Secondary | ICD-10-CM | POA: Diagnosis not present

## 2022-11-01 DIAGNOSIS — M47892 Other spondylosis, cervical region: Secondary | ICD-10-CM | POA: Diagnosis not present

## 2022-11-01 NOTE — Progress Notes (Signed)
Office Visit Note   Patient: Erin Guerrero           Date of Birth: 1950-11-01           MRN: [id number] Visit Date: 11/01/2022              Requested by: Dettinger, Elige Radon, MD [address] [place],  [place] [address] PCP: Dettinger, Elige Radon, MD   Assessment & Plan: Visit Diagnoses:  1. Neck pain   2. Chronic bilateral low back pain, unspecified whether sciatica present     Plan: Will set patient up for physical therapy for treatment of her cervical spondylosis and lumbar disc degeneration.  She can follow-up in 6 weeks.  Follow-Up Instructions: Return in about 6 weeks (around 12/13/2022).   Orders:  Orders Placed This Encounter  Procedures   XR Cervical Spine 2 or 3 views   XR Lumbar Spine 2-3 Views   Ambulatory referral to Physical Therapy   No orders of the defined types were placed in this encounter.     Procedures: No procedures performed   Clinical Data: No additional findings.   Subjective: Chief Complaint  Patient presents with   Neck - Pain   Lower Back - Pain    HPI 72 year old female here for concern about neurogenic claudication pain in her neck numbness in her hands and problems with walking with back pain.  She has had previous carpal tunnel releases by Dr. Cleophas Dunker in October and November 2023 doing well.  She has more neck than back pain numbness and tingling in her ring and small finger.  She has some difficulty moving her hands at times but does not have any locking.  Neck pain has been present for greater than 6 months progressing and she states she only has 50% rotation of her neck limited by sharp severe pains.  Low back pain issues that radiate from her back into her legs.  She has been using a back brace at times which she thinks helps to some degree.  She is on ibuprofen 800 mg p.o. twice daily with meals.  Additional problems include COPD Gold.  Continued cigarette smoking hypertension.  Review of Systems all other systems noncontributory  to HPI.   Objective: Vital Signs: Ht 5\' 3"  (1.6 m)   Wt 165 lb (74.8 kg)   BMI 29.23 kg/m   Physical Exam Constitutional:      Appearance: She is well-developed.  HENT:     Head: Normocephalic.     Right Ear: External ear normal.     Left Ear: External ear normal. There is no impacted cerumen.  Eyes:     Pupils: Pupils are equal, round, and reactive to light.  Neck:     Thyroid: No thyromegaly.     Trachea: No tracheal deviation.  Cardiovascular:     Rate and Rhythm: Normal rate.  Pulmonary:     Effort: Pulmonary effort is normal.  Abdominal:     Palpations: Abdomen is soft.  Musculoskeletal:     Cervical back: No rigidity.  Skin:    General: Skin is warm and dry.  Neurological:     Mental Status: She is alert and oriented to person, place, and time.  Psychiatric:        Behavior: Behavior normal.     Ortho Exam bilateral brachial plexus tenderness reflexes are 2+ and symmetrical healed carpal tunnel incisions.  No triggering of the flexor tendons.  Knee and ankle jerk are intact some  sciatic notch tenderness.  Station intact lower extremities.  Specialty Comments:  No specialty comments available.  Imaging: XR Lumbar Spine 2-3 Views  Result Date: 11/01/2022 AP lateral lumbar spine images are obtained and reviewed.  Multilevel disc space narrowing more pronounced at L3-4 and L5-S1. Impression: Lumbar disc degeneration more pronounced L3-4 and L5-S1.  XR Cervical Spine 2 or 3 views  Result Date: 11/01/2022 AP lateral cervical spine images obtained and reviewed.  Curvature lordosis is maintained disc base narrowing C5-6 C6-7 more pronounced.  No spondylolisthesis.  Some endplate spurring. Impression: cervical spondylosis.  No evidence of acute changes.    PMFS History: Patient Active Problem List   Diagnosis Date Noted   Carpal tunnel syndrome, bilateral upper limbs 02/15/2022   Allergic drug reaction 02/12/2018   Obesity (BMI 30.0-34.9) 12/31/2017    Osteopenia of necks of both femurs 01/25/2017   Solitary pulmonary nodule 08/11/2016   COPD  GOLD III  08/10/2016   HTN (hypertension)    Cigarette smoker    History of colonic polyps 02/25/2016   Benign neoplasm breast 01/22/2013   Hyperlipidemia 10/22/2012   Past Medical History:  Diagnosis Date   Acute respiratory failure (HCC)    CAP (community acquired pneumonia)    Cataract    Chronic bronchitis (HCC)    COPD (chronic obstructive pulmonary disease) (HCC)    HTN (hypertension)    Hyperlipidemia    Influenza    Osteoarthritis    Pulmonary nodule    Seasonal allergies    Tobacco use     Family History  Problem Relation Age of Onset   Cancer Mother 80       Question cervical/ovarian   Arthritis Mother    Kidney failure Sister 85       born with one kidney, other kidney injured by mva   Kidney failure Sister    Lupus Sister    Diabetes Sister    Thyroid cancer Daughter    Arthritis Brother    Hearing loss Brother    Scoliosis Brother    Celiac disease Neg Hx    Inflammatory bowel disease Neg Hx     Past Surgical History:  Procedure Laterality Date   BREAST BIOPSY  2014   Benign   Cervical cryo-treatment  1972   CHOLECYSTECTOMY  1998   COLONOSCOPY     Polyps   COLONOSCOPY N/A 03/16/2016   Procedure: COLONOSCOPY;  Surgeon: Corbin Ade, MD;  [facility];  Service: Endoscopy;  Laterality: N/A;  1:30 PM   DILATION AND CURETTAGE OF UTERUS     GANGLION CYST EXCISION  1970   Left wrist   GANGLION CYST EXCISION     Right hand and left ring finger   LUMBAR LAMINECTOMY  1985, 1987   MENISCECTOMY  2008   POLYPECTOMY  03/16/2016   Procedure: POLYPECTOMY;  Surgeon: Corbin Ade, MD;  Location: [facility];  Service: Endoscopy;;  colon   ROTATOR CUFF REPAIR  2004   Right   TUBAL LIGATION     Social History   Occupational History   Occupation: PT for [employer], OR/ER nurse  Tobacco Use   Smoking status: Some Days    Packs/day: 0.25    Years: 50.00     Additional pack years: 0.00    Total pack years: 12.50    Types: Cigarettes   Smokeless tobacco: Never   Tobacco comments:    Smoker of 1 ppd x 50 years - quit 03/2017 - started  back light smoker currently 4-5 per day  Vaping Use   Vaping Use: Never used  Substance and Sexual Activity   Alcohol use: No   Drug use: No   Sexual activity: Never

## 2022-11-06 DIAGNOSIS — M47892 Other spondylosis, cervical region: Secondary | ICD-10-CM | POA: Diagnosis not present

## 2022-11-06 DIAGNOSIS — M5136 Other intervertebral disc degeneration, lumbar region: Secondary | ICD-10-CM | POA: Diagnosis not present

## 2022-11-08 DIAGNOSIS — M5136 Other intervertebral disc degeneration, lumbar region: Secondary | ICD-10-CM | POA: Diagnosis not present

## 2022-11-08 DIAGNOSIS — M47892 Other spondylosis, cervical region: Secondary | ICD-10-CM | POA: Diagnosis not present

## 2022-11-14 DIAGNOSIS — M5136 Other intervertebral disc degeneration, lumbar region: Secondary | ICD-10-CM | POA: Diagnosis not present

## 2022-11-14 DIAGNOSIS — M47892 Other spondylosis, cervical region: Secondary | ICD-10-CM | POA: Diagnosis not present

## 2022-11-17 DIAGNOSIS — M47892 Other spondylosis, cervical region: Secondary | ICD-10-CM | POA: Diagnosis not present

## 2022-11-17 DIAGNOSIS — M5136 Other intervertebral disc degeneration, lumbar region: Secondary | ICD-10-CM | POA: Diagnosis not present

## 2022-11-20 DIAGNOSIS — M47892 Other spondylosis, cervical region: Secondary | ICD-10-CM | POA: Diagnosis not present

## 2022-11-20 DIAGNOSIS — M5136 Other intervertebral disc degeneration, lumbar region: Secondary | ICD-10-CM | POA: Diagnosis not present

## 2022-11-23 DIAGNOSIS — M47892 Other spondylosis, cervical region: Secondary | ICD-10-CM | POA: Diagnosis not present

## 2022-11-23 DIAGNOSIS — M5136 Other intervertebral disc degeneration, lumbar region: Secondary | ICD-10-CM | POA: Diagnosis not present

## 2022-11-27 DIAGNOSIS — M47892 Other spondylosis, cervical region: Secondary | ICD-10-CM | POA: Diagnosis not present

## 2022-11-27 DIAGNOSIS — M5136 Other intervertebral disc degeneration, lumbar region: Secondary | ICD-10-CM | POA: Diagnosis not present

## 2022-11-30 DIAGNOSIS — M47892 Other spondylosis, cervical region: Secondary | ICD-10-CM | POA: Diagnosis not present

## 2022-11-30 DIAGNOSIS — M5136 Other intervertebral disc degeneration, lumbar region: Secondary | ICD-10-CM | POA: Diagnosis not present

## 2022-12-04 DIAGNOSIS — M5136 Other intervertebral disc degeneration, lumbar region: Secondary | ICD-10-CM | POA: Diagnosis not present

## 2022-12-04 DIAGNOSIS — M47892 Other spondylosis, cervical region: Secondary | ICD-10-CM | POA: Diagnosis not present

## 2022-12-06 DIAGNOSIS — M47892 Other spondylosis, cervical region: Secondary | ICD-10-CM | POA: Diagnosis not present

## 2022-12-06 DIAGNOSIS — M5136 Other intervertebral disc degeneration, lumbar region: Secondary | ICD-10-CM | POA: Diagnosis not present

## 2022-12-13 ENCOUNTER — Ambulatory Visit (INDEPENDENT_AMBULATORY_CARE_PROVIDER_SITE_OTHER): Payer: Medicare Other | Admitting: Orthopaedic Surgery

## 2022-12-13 ENCOUNTER — Encounter: Payer: Self-pay | Admitting: Orthopaedic Surgery

## 2022-12-13 VITALS — Ht 63.0 in | Wt 162.0 lb

## 2022-12-13 DIAGNOSIS — M542 Cervicalgia: Secondary | ICD-10-CM | POA: Diagnosis not present

## 2022-12-13 NOTE — Progress Notes (Unsigned)
Office Visit Note   Patient: Erin Guerrero           Date of Birth: [birth year]           MRN: [id number] Visit Date: 12/13/2022              Requested by: Dettinger, Elige Radon, MD [address] [place],  [place] [address] PCP: Dettinger, Elige Radon, MD   Assessment & Plan: Visit Diagnoses: No diagnosis found.  Plan: ***  Follow-Up Instructions: No follow-ups on file.   Orders:  No orders of the defined types were placed in this encounter.  No orders of the defined types were placed in this encounter.     Procedures: No procedures performed   Clinical Data: No additional findings.   Subjective: Chief Complaint  Patient presents with   Neck - Pain, Follow-up    HPI  Review of Systems   Objective: Vital Signs: Ht 5\' 3"  (1.6 m)   Wt 162 lb (73.5 kg)   BMI 28.70 kg/m   Physical Exam  Ortho Exam  Specialty Comments:  No specialty comments available.  Imaging: No results found.   PMFS History: Patient Active Problem List   Diagnosis Date Noted   Carpal tunnel syndrome, bilateral upper limbs 02/15/2022   Allergic drug reaction 02/12/2018   Obesity (BMI 30.0-34.9) 12/31/2017   Osteopenia of necks of both femurs 01/25/2017   Solitary pulmonary nodule 08/11/2016   COPD  GOLD III  08/10/2016   HTN (hypertension)    Cigarette smoker    History of colonic polyps 02/25/2016   Benign neoplasm breast 01/22/2013   Hyperlipidemia 10/22/2012   Past Medical History:  Diagnosis Date   Acute respiratory failure (HCC)    CAP (community acquired pneumonia)    Cataract    Chronic bronchitis (HCC)    COPD (chronic obstructive pulmonary disease) (HCC)    HTN (hypertension)    Hyperlipidemia    Influenza    Osteoarthritis    Pulmonary nodule    Seasonal allergies    Tobacco use     Family History  Problem Relation Age of Onset   Cancer Mother 46       Question cervical/ovarian   Arthritis Mother    Kidney failure Sister 66       born with one kidney,  other kidney injured by mva   Kidney failure Sister    Lupus Sister    Diabetes Sister    Thyroid cancer Daughter    Arthritis Brother    Hearing loss Brother    Scoliosis Brother    Celiac disease Neg Hx    Inflammatory bowel disease Neg Hx     Past Surgical History:  Procedure Laterality Date   BREAST BIOPSY  2014   Benign   Cervical cryo-treatment  1972   CHOLECYSTECTOMY  1998   COLONOSCOPY     Polyps   COLONOSCOPY N/A 03/16/2016   Procedure: COLONOSCOPY;  Surgeon: Corbin Ade, MD;  [facility];  Service: Endoscopy;  Laterality: N/A;  1:30 PM   DILATION AND CURETTAGE OF UTERUS     GANGLION CYST EXCISION  1970   Left wrist   GANGLION CYST EXCISION     Right hand and left ring finger   LUMBAR LAMINECTOMY  1985, 1987   MENISCECTOMY  2008   POLYPECTOMY  03/16/2016   Procedure: POLYPECTOMY;  Surgeon: Corbin Ade, MD;  [facility];  Service: Endoscopy;;  colon  ROTATOR CUFF REPAIR  2004   Right   TUBAL LIGATION     Social History   Occupational History   Occupation: PT for [employer], OR/ER nurse  Tobacco Use   Smoking status: Some Days    Packs/day: 0.25    Years: 50.00    Additional pack years: 0.00    Total pack years: 12.50    Types: Cigarettes   Smokeless tobacco: Never   Tobacco comments:    Smoker of 1 ppd x 50 years - quit 03/2017 - started back light smoker currently 4-5 per day  Vaping Use   Vaping Use: Never used  Substance and Sexual Activity   Alcohol use: No   Drug use: No   Sexual activity: Never

## 2022-12-14 DIAGNOSIS — M542 Cervicalgia: Secondary | ICD-10-CM | POA: Insufficient documentation

## 2022-12-25 ENCOUNTER — Other Ambulatory Visit: Payer: Self-pay | Admitting: Family Medicine

## 2022-12-25 DIAGNOSIS — I10 Essential (primary) hypertension: Secondary | ICD-10-CM

## 2023-01-25 ENCOUNTER — Encounter: Payer: Medicare Other | Admitting: Family Medicine

## 2023-01-29 ENCOUNTER — Encounter: Payer: Self-pay | Admitting: *Deleted

## 2023-03-24 ENCOUNTER — Other Ambulatory Visit: Payer: Self-pay | Admitting: Family Medicine

## 2023-03-24 DIAGNOSIS — I10 Essential (primary) hypertension: Secondary | ICD-10-CM

## 2023-04-02 ENCOUNTER — Encounter: Payer: Self-pay | Admitting: Family Medicine

## 2023-04-02 ENCOUNTER — Ambulatory Visit: Payer: Medicare Other | Admitting: Family Medicine

## 2023-04-02 ENCOUNTER — Ambulatory Visit (INDEPENDENT_AMBULATORY_CARE_PROVIDER_SITE_OTHER): Payer: Medicare Other

## 2023-04-02 VITALS — BP 120/66 | HR 78 | Ht 63.0 in | Wt 166.0 lb

## 2023-04-02 DIAGNOSIS — I10 Essential (primary) hypertension: Secondary | ICD-10-CM | POA: Diagnosis not present

## 2023-04-02 DIAGNOSIS — E7800 Pure hypercholesterolemia, unspecified: Secondary | ICD-10-CM

## 2023-04-02 DIAGNOSIS — Z0001 Encounter for general adult medical examination with abnormal findings: Secondary | ICD-10-CM | POA: Diagnosis not present

## 2023-04-02 DIAGNOSIS — J449 Chronic obstructive pulmonary disease, unspecified: Secondary | ICD-10-CM

## 2023-04-02 DIAGNOSIS — M85852 Other specified disorders of bone density and structure, left thigh: Secondary | ICD-10-CM | POA: Diagnosis not present

## 2023-04-02 DIAGNOSIS — M8588 Other specified disorders of bone density and structure, other site: Secondary | ICD-10-CM | POA: Diagnosis not present

## 2023-04-02 DIAGNOSIS — F419 Anxiety disorder, unspecified: Secondary | ICD-10-CM

## 2023-04-02 DIAGNOSIS — Z0000 Encounter for general adult medical examination without abnormal findings: Secondary | ICD-10-CM | POA: Diagnosis not present

## 2023-04-02 DIAGNOSIS — Z780 Asymptomatic menopausal state: Secondary | ICD-10-CM | POA: Diagnosis not present

## 2023-04-02 DIAGNOSIS — M85851 Other specified disorders of bone density and structure, right thigh: Secondary | ICD-10-CM

## 2023-04-02 MED ORDER — IRBESARTAN 150 MG PO TABS
150.0000 mg | ORAL_TABLET | Freq: Every day | ORAL | 0 refills | Status: DC
Start: 2023-04-02 — End: 2023-08-20

## 2023-04-02 MED ORDER — IPRATROPIUM BROMIDE HFA 17 MCG/ACT IN AERS: 2.0000 | INHALATION_SPRAY | Freq: Four times a day (QID) | RESPIRATORY_TRACT | 12 refills | Status: DC | PRN

## 2023-04-02 NOTE — Progress Notes (Signed)
BP 120/66   Pulse 78   Ht 5\' 3"  (1.6 m)   Wt 166 lb (75.3 kg)   SpO2 97%   BMI 29.41 kg/m    Subjective:   Patient ID: Erin Guerrero, female    DOB: 1950/08/18, 72 y.o.   MRN: [id number]  HPI: Erin Guerrero is a 72 y.o. female presenting on 04/02/2023 for Medical Management of Chronic Issues (CPE)   HPI Physical exam Patient denies any chest pain, shortness of breath, headaches or vision issues, abdominal complaints, diarrhea, nausea, vomiting.   Hypertension Patient is currently on irbesartan, and their blood pressure today is 120/66. Patient denies any lightheadedness or dizziness. Patient denies headaches, blurred vision, chest pains, shortness of breath, or weakness. Denies any side effects from medication and is content with current medication.   Hyperlipidemia Patient is coming in for recheck of his hyperlipidemia. The patient is currently taking diet control, has not tolerated statins. They deny any issues with myalgias or history of liver damage from it. They deny any focal numbness or weakness or chest pain.   COPD Patient is coming in for COPD recheck today.  He is currently on Spiriva.  He has a mild chronic cough but denies any major coughing spells or wheezing spells.  He has 2 nighttime symptoms per week and 4 daytime symptoms per week currently.  She does not tolerate sulfur so she cannot take albuterol  Relevant past medical, surgical, family and social history reviewed and updated as indicated. Interim medical history since our last visit reviewed. Allergies and medications reviewed and updated.  Review of Systems  Constitutional:  Negative for chills and fever.  Eyes:  Negative for redness and visual disturbance.  Respiratory:  Negative for chest tightness and shortness of breath.   Cardiovascular:  Negative for chest pain and leg swelling.  Genitourinary:  Negative for difficulty urinating and dysuria.  Musculoskeletal:  Negative for back pain and gait  problem.  Skin:  Negative for rash.  Neurological:  Negative for dizziness, light-headedness and headaches.  Psychiatric/Behavioral:  Negative for agitation and behavioral problems.   All other systems reviewed and are negative.   Per HPI unless specifically indicated above   Allergies as of 04/02/2023       Reactions   Diphenhydramine Hcl Anaphylaxis   Morphine Rash   Other   Statins Other (See Comments)   Muscle cramps/spasms, Severe skin rash    Red Yeast Rice [cholestin] Rash   Triclosan Rash   Other    Bleach - contact dermatitis   Benzocaine Rash   Latex Rash   Sulfa Antibiotics Rash   Muscle cramps.        Medication List        Accurate as of April 02, 2023  1:56 PM. If you have any questions, ask your nurse or doctor.          STOP taking these medications    varenicline 1 MG tablet Commonly known as: Chantix Continuing Month Pak Stopped by: Elige Radon Jehan Ranganathan   Varenicline Tartrate (Starter) 0.5 MG X 11 & 1 MG X 42 Tbpk Commonly known as: Chantix Starting [employer] by: Elige Radon Marlaysia Lenig       TAKE these medications    albuterol 108 (90 Base) MCG/ACT inhaler Commonly known as: VENTOLIN HFA Inhale 1-2 puffs into the lungs every 6 (six) hours as needed for wheezing or shortness of breath.   ascorbic acid 500 MG tablet Commonly known as: VITAMIN  C Take 500 mg by mouth daily.   aspirin EC 81 MG tablet Take 81 mg by mouth daily.   CALCIUM 1200 PO Take 1,200 mg by mouth daily.   cetirizine 10 MG tablet Commonly known as: ZYRTEC Take 10 mg by mouth daily as needed.   cholecalciferol 1000 units tablet Commonly known as: VITAMIN D Take 2,000 Units by mouth daily.   clobetasol ointment 0.05 % Commonly known as: TEMOVATE APPLY EXTERNALLY TO THE AFFECTED AREA TWICE DAILY   diazepam 5 MG tablet Commonly known as: Valium One every 6 hours for muscle spasm   ibuprofen 800 MG tablet Commonly known as: ADVIL Take 800 mg by  mouth every 8 (eight) hours as needed.   ipratropium 17 MCG/ACT inhaler Commonly known as: ATROVENT HFA Inhale 2 puffs into the lungs every 6 (six) hours as needed for wheezing. Started by: Elige Radon Camyla Camposano   irbesartan 150 MG tablet Commonly known as: AVAPRO Take 1 tablet (150 mg total) by mouth daily.   Spiriva Respimat 2.5 MCG/ACT Aers Generic drug: Tiotropium Bromide Monohydrate Inhale 2 puffs into the lungs daily.   traMADol 50 MG tablet Commonly known as: ULTRAM Take 1 tablet (50 mg total) by mouth 3 (three) times daily as needed.   triamcinolone 55 MCG/ACT Aero nasal inhaler Commonly known as: NASACORT Place 2 sprays into the nose daily as needed (allergies).   TURMERIC CURCUMIN PO Take 1 tablet by mouth daily.         Objective:   BP 120/66   Pulse 78   Ht 5\' 3"  (1.6 m)   Wt 166 lb (75.3 kg)   SpO2 97%   BMI 29.41 kg/m   Wt Readings from Last 3 Encounters:  04/02/23 166 lb (75.3 kg)  12/13/22 162 lb (73.5 kg)  11/01/22 165 lb (74.8 kg)    Physical Exam Vitals and nursing note reviewed.  Constitutional:      General: She is not in acute distress.    Appearance: She is well-developed. She is not diaphoretic.  Eyes:     Conjunctiva/sclera: Conjunctivae normal.  Cardiovascular:     Rate and Rhythm: Normal rate and regular rhythm.     Heart sounds: Normal heart sounds. No murmur heard. Pulmonary:     Effort: Pulmonary effort is normal. No respiratory distress.     Breath sounds: Normal breath sounds. No wheezing.  Musculoskeletal:        General: No swelling.  Skin:    General: Skin is warm and dry.     Findings: No rash.  Neurological:     Mental Status: She is alert and oriented to person, place, and time.     Coordination: Coordination normal.  Psychiatric:        Behavior: Behavior normal.       Assessment & Plan:   Problem List Items Addressed This Visit       Cardiovascular and Mediastinum   HTN (hypertension)   Relevant  Medications   irbesartan (AVAPRO) 150 MG tablet     Respiratory   COPD  GOLD III    Relevant Medications   ipratropium (ATROVENT HFA) 17 MCG/ACT inhaler     Musculoskeletal and Integument   Osteopenia of necks of both femurs   Relevant Orders   DG [facility] DEXA     Other   Hyperlipidemia   Relevant Medications   irbesartan (AVAPRO) 150 MG tablet   Other Relevant Orders   CBC with Differential/Platelet   CMP14+EGFR   Lipid  panel   Other Visit Diagnoses     Physical exam    -  Primary   Relevant Orders   CBC with Differential/Platelet   CMP14+EGFR   Lipid panel   Essential hypertension       Relevant Medications   irbesartan (AVAPRO) 150 MG tablet   Other Relevant Orders   CBC with Differential/Platelet   CMP14+EGFR   Postmenopausal       Anxiety           Seems to be doing well for the most part except for a little bit of breathing issues.  Sent in ipratropium is a different rescue inhaler that she can try. Follow up plan: Return in about 1 year (around 04/01/2024), or if symptoms worsen or fail to improve, for Physical exam and hypertension and hyperlipidemia recheck.  Counseling provided for all of the vaccine components Orders Placed This Encounter  Procedures   DG [facility] DEXA   CBC with Differential/Platelet   CMP14+EGFR   Lipid panel    Arville Care, MD [facility] 04/02/2023, 1:56 PM

## 2023-04-03 ENCOUNTER — Other Ambulatory Visit: Payer: Self-pay | Admitting: Family Medicine

## 2023-04-03 LAB — CMP14+EGFR
ALT: 15 [IU]/L (ref 0–32)
AST: 16 [IU]/L (ref 0–40)
Albumin: 4.3 g/dL (ref 3.8–4.8)
Alkaline Phosphatase: 87 [IU]/L (ref 44–121)
BUN/Creatinine Ratio: 19 (ref 12–28)
BUN: 14 mg/dL (ref 8–27)
Bilirubin Total: 0.3 mg/dL (ref 0.0–1.2)
CO2: 23 mmol/L (ref 20–29)
Calcium: 10 mg/dL (ref 8.7–10.3)
Chloride: 104 mmol/L (ref 96–106)
Creatinine, Ser: 0.73 mg/dL (ref 0.57–1.00)
Globulin, Total: 2.2 g/dL (ref 1.5–4.5)
Glucose: 89 mg/dL (ref 70–99)
Potassium: 4.3 mmol/L (ref 3.5–5.2)
Sodium: 143 mmol/L (ref 134–144)
Total Protein: 6.5 g/dL (ref 6.0–8.5)
eGFR: 87 mL/min/{1.73_m2} (ref >59)

## 2023-04-03 LAB — CBC WITH DIFFERENTIAL/PLATELET
Basophils Absolute: 0 10*3/uL (ref 0.0–0.2)
Basos: 1 %
EOS (ABSOLUTE): 0.2 10*3/uL (ref 0.0–0.4)
Eos: 3 %
Hematocrit: 43.3 % (ref 34.0–46.6)
Hemoglobin: 14.9 g/dL (ref 11.1–15.9)
Immature Grans (Abs): 0 10*3/uL (ref 0.0–0.1)
Immature Granulocytes: 0 %
Lymphocytes Absolute: 2.3 10*3/uL (ref 0.7–3.1)
Lymphs: 31 %
MCH: 32.5 pg (ref 26.6–33.0)
MCHC: 34.4 g/dL (ref 31.5–35.7)
MCV: 95 fL (ref 79–97)
Monocytes Absolute: 0.8 10*3/uL (ref 0.1–0.9)
Monocytes: 11 %
Neutrophils Absolute: 4 10*3/uL (ref 1.4–7.0)
Neutrophils: 54 %
Platelets: 181 10*3/uL (ref 150–450)
RBC: 4.58 x10E6/uL (ref 3.77–5.28)
RDW: 11.8 % (ref 11.7–15.4)
WBC: 7.4 10*3/uL (ref 3.4–10.8)

## 2023-04-03 LAB — LIPID PANEL
Chol/HDL Ratio: 5.3 {ratio} — ABNORMAL HIGH (ref 0.0–4.4)
Cholesterol, Total: 269 mg/dL — ABNORMAL HIGH (ref 100–199)
HDL: 51 mg/dL (ref >39)
LDL Chol Calc (NIH): 171 mg/dL — ABNORMAL HIGH (ref 0–99)
Triglycerides: 248 mg/dL — ABNORMAL HIGH (ref 0–149)
VLDL Cholesterol Cal: 47 mg/dL — ABNORMAL HIGH (ref 5–40)

## 2023-04-05 ENCOUNTER — Other Ambulatory Visit (HOSPITAL_COMMUNITY): Payer: Self-pay | Admitting: Family Medicine

## 2023-04-05 DIAGNOSIS — Z1231 Encounter for screening mammogram for malignant neoplasm of breast: Secondary | ICD-10-CM

## 2023-04-06 DIAGNOSIS — M85852 Other specified disorders of bone density and structure, left thigh: Secondary | ICD-10-CM | POA: Diagnosis not present

## 2023-04-06 DIAGNOSIS — M85851 Other specified disorders of bone density and structure, right thigh: Secondary | ICD-10-CM | POA: Diagnosis not present

## 2023-04-06 DIAGNOSIS — Z780 Asymptomatic menopausal state: Secondary | ICD-10-CM | POA: Diagnosis not present

## 2023-04-06 MED ORDER — EZETIMIBE 10 MG PO TABS: 10.0000 mg | ORAL_TABLET | Freq: Every day | ORAL | 3 refills | Status: DC

## 2023-04-06 NOTE — Addendum Note (Signed)
Addended by: Austin Miles F on: 04/06/2023 10:39 AM   Modules accepted: Orders

## 2023-04-17 ENCOUNTER — Telehealth: Payer: Self-pay | Admitting: Internal Medicine

## 2023-04-17 NOTE — Telephone Encounter (Signed)
Procedure: Colonoscopy  Height: 5'2.5 Weight: 164lbs        Have you had a colonoscopy before?  Yes 03/16/16, Dr. Jena Gauss  Do you have family history of colon cancer?  no  Do you have a family history of polyps? no  Previous colonoscopy with polyps removed? yes  Do you have a history colorectal cancer?   no  Are you diabetic?  no  Do you have a prosthetic or mechanical heart valve? no  Do you have a pacemaker/defibrillator?   no  Have you had endocarditis/atrial fibrillation?  no  Do you use supplemental oxygen/CPAP?  no  Have you had joint replacement within the last 12 months?  no  Do you tend to be constipated or have to use laxatives?  no   Do you have history of alcohol use? If yes, how much and how often.  no  Do you have history or are you using drugs? If yes, what do are you  using?  no  Have you ever had a stroke/heart attack?  no  Have you ever had a heart or other vascular stent placed,?no  Do you take weight loss medication? no  female patients,: have you had a hysterectomy? no                              are you post menopausal?  yes                              do you still have your menstrual cycle? no    Date of last menstrual period?   Do you take any blood-thinning medications such as: (Plavix, aspirin, Coumadin, Aggrenox, Brilinta, Xarelto, Eliquis, Pradaxa, Savaysa or Effient)? Aspirin 81mg  If yes we need the name, milligram, dosage and who is prescribing doctor:               Current Outpatient Medications  Medication Sig Dispense Refill   albuterol (VENTOLIN HFA) 108 (90 Base) MCG/ACT inhaler Inhale 1-2 puffs into the lungs every 6 (six) hours as needed for wheezing or shortness of breath. 18 g 5   ascorbic acid (VITAMIN C) 500 MG tablet Take 500 mg by mouth daily.     aspirin EC 81 MG tablet Take 81 mg by mouth daily.     Calcium Carbonate-Vit D-Min (CALCIUM 1200 PO) Take 1,200 mg by mouth daily.     cetirizine (ZYRTEC) 10 MG tablet Take 10  mg by mouth daily as needed.      cholecalciferol (VITAMIN D) 1000 units tablet Take 2,000 Units by mouth daily.     clobetasol ointment (TEMOVATE) 0.05 % APPLY EXTERNALLY TO THE AFFECTED AREA TWICE DAILY 60 g 1   diazepam (VALIUM) 5 MG tablet One every 6 hours for muscle spasm 20 tablet 1   ezetimibe (ZETIA) 10 MG tablet Take 1 tablet (10 mg total) by mouth daily. 90 tablet 3   ibuprofen (ADVIL,MOTRIN) 800 MG tablet Take 800 mg by mouth every 8 (eight) hours as needed.     ipratropium (ATROVENT HFA) 17 MCG/ACT inhaler Inhale 2 puffs into the lungs every 6 (six) hours as needed for wheezing. 1 each 12   irbesartan (AVAPRO) 150 MG tablet Take 1 tablet (150 mg total) by mouth daily. 100 tablet 0   Tiotropium Bromide Monohydrate (SPIRIVA RESPIMAT) 2.5 MCG/ACT AERS INHALE 2 PUFFS INTO THE LUNGS ONCE DAILY. 4 g  1   traMADol (ULTRAM) 50 MG tablet Take 1 tablet (50 mg total) by mouth 3 (three) times daily as needed. 30 tablet 0   triamcinolone (NASACORT) 55 MCG/ACT AERO nasal inhaler Place 2 sprays into the nose daily as needed (allergies).      TURMERIC CURCUMIN PO Take 1 tablet by mouth daily.     No current facility-administered medications for this visit.    Allergies  Allergen Reactions   Diphenhydramine Hcl Anaphylaxis   Morphine Rash    Other   Statins Other (See Comments)    Muscle cramps/spasms, Severe skin rash    Red Yeast Rice [Cholestin] Rash   Triclosan Rash   Other     Bleach - contact dermatitis   Benzocaine Rash   Latex Rash   Sulfa Antibiotics Rash    Muscle cramps.

## 2023-04-17 NOTE — Telephone Encounter (Signed)
Questionnaire in review

## 2023-04-18 NOTE — Telephone Encounter (Signed)
Left a message asking pt to call office to schedule OV before procedure is scheduled

## 2023-04-18 NOTE — Telephone Encounter (Signed)
Due to copd, ASA 3. Needs ov with APP.

## 2023-04-18 NOTE — Telephone Encounter (Signed)
Questionnaire from recall, no referral needed  

## 2023-04-19 ENCOUNTER — Ambulatory Visit: Payer: Medicare Other | Admitting: Gastroenterology

## 2023-04-19 ENCOUNTER — Encounter: Payer: Self-pay | Admitting: *Deleted

## 2023-04-19 ENCOUNTER — Encounter: Payer: Self-pay | Admitting: Gastroenterology

## 2023-04-19 VITALS — BP 136/74 | HR 76 | Temp 97.9°F | Ht 62.5 in | Wt 164.6 lb

## 2023-04-19 DIAGNOSIS — F17200 Nicotine dependence, unspecified, uncomplicated: Secondary | ICD-10-CM

## 2023-04-19 DIAGNOSIS — Z860100 Personal history of colon polyps, unspecified: Secondary | ICD-10-CM

## 2023-04-19 NOTE — Patient Instructions (Signed)
We will get you scheduled for a colonoscopy in the near future with Dr. Jena Gauss.  Great to meet you today!  Happy Holidays!  It was a pleasure to see you today. I want to create trusting relationships with patients. If you receive a survey regarding your visit,  I greatly appreciate you taking time to fill this out on paper or through your MyChart. I value your feedback.  Brooke Bonito, MSN, FNP-BC, AGACNP-BC [facility]

## 2023-04-19 NOTE — Progress Notes (Signed)
GI Office Note    Referring Provider: Dettinger, Elige Radon, MD Primary Care Physician:  Dettinger, Elige Radon, MD  Primary Gastroenterologist: Gerrit Friends.Rourk, MD  Chief Complaint   Chief Complaint  Patient presents with   Colonoscopy    Colonoscopy screening    History of Present Illness   Erin Guerrero is a 72 y.o. female presenting today at the request of Dettinger, Elige Radon, MD for office visit prior to scheduling colonoscopy.  Last colonoscopy September 2017 -Sigmoid diverticulosis -3 mm polyp in the cecum -Pathology with benign polypoid tissue. -Advised repeat in 7 years  She denies any change in bowel habits, melena, brbpr, unintentional weight loss, lack of appetite, chest pain.  Denies any dizziness, lightheadedness, or syncope.  Only gets short of breath with strenuous exercise. Does not use rescue inhalers. Takes Spiriva daily for her COPD. Allergic to sulfates. She was given Atrovent and doing that once daily right now.   Blood pressure controlled with medication.   No acid reflux, N/V. No dysphagia.  Currently smokes about half pack per day.  Has been allergic to cholesterol medications and just started on Zetia.    Current Outpatient Medications  Medication Sig Dispense Refill   albuterol (VENTOLIN HFA) 108 (90 Base) MCG/ACT inhaler Inhale 1-2 puffs into the lungs every 6 (six) hours as needed for wheezing or shortness of breath. 18 g 5   ascorbic acid (VITAMIN C) 500 MG tablet Take 500 mg by mouth daily.     aspirin EC 81 MG tablet Take 81 mg by mouth daily.     Calcium Carbonate-Vit D-Min (CALCIUM 1200 PO) Take 1,200 mg by mouth daily.     cetirizine (ZYRTEC) 10 MG tablet Take 10 mg by mouth daily as needed.      cholecalciferol (VITAMIN D) 1000 units tablet Take 2,000 Units by mouth daily.     clobetasol ointment (TEMOVATE) 0.05 % APPLY EXTERNALLY TO THE AFFECTED AREA TWICE DAILY 60 g 1   diazepam (VALIUM) 5 MG tablet One every 6 hours for muscle spasm  20 tablet 1   ezetimibe (ZETIA) 10 MG tablet Take 1 tablet (10 mg total) by mouth daily. 90 tablet 3   ibuprofen (ADVIL,MOTRIN) 800 MG tablet Take 800 mg by mouth every 8 (eight) hours as needed.     ipratropium (ATROVENT HFA) 17 MCG/ACT inhaler Inhale 2 puffs into the lungs every 6 (six) hours as needed for wheezing. 1 each 12   irbesartan (AVAPRO) 150 MG tablet Take 1 tablet (150 mg total) by mouth daily. 100 tablet 0   Tiotropium Bromide Monohydrate (SPIRIVA RESPIMAT) 2.5 MCG/ACT AERS INHALE 2 PUFFS INTO THE LUNGS ONCE DAILY. 4 g 1   triamcinolone (NASACORT) 55 MCG/ACT AERO nasal inhaler Place 2 sprays into the nose daily as needed (allergies).      TURMERIC CURCUMIN PO Take 1 tablet by mouth daily.     traMADol (ULTRAM) 50 MG tablet Take 1 tablet (50 mg total) by mouth 3 (three) times daily as needed. (Patient not taking: Reported on 04/19/2023) 30 tablet 0   No current facility-administered medications for this visit.    Past Medical History:  Diagnosis Date   Acute respiratory failure (HCC)    CAP (community acquired pneumonia)    Cataract    Chronic bronchitis (HCC)    COPD (chronic obstructive pulmonary disease) (HCC)    HTN (hypertension)    Hyperlipidemia    Influenza    Osteoarthritis    Pulmonary nodule  Seasonal allergies    Tobacco use     Past Surgical History:  Procedure Laterality Date   BREAST BIOPSY  2014   Benign   Cervical cryo-treatment  1972   CHOLECYSTECTOMY  1998   COLONOSCOPY     Polyps   COLONOSCOPY N/A 03/16/2016   Procedure: COLONOSCOPY;  Surgeon: Corbin Ade, MD;  Location: [facility];  Service: Endoscopy;  Laterality: N/A;  1:30 PM   DILATION AND CURETTAGE OF UTERUS     GANGLION CYST EXCISION  1970   Left wrist   GANGLION CYST EXCISION     Right hand and left ring finger   LUMBAR LAMINECTOMY  1985, 1987   MENISCECTOMY  2008   POLYPECTOMY  03/16/2016   Procedure: POLYPECTOMY;  Surgeon: Corbin Ade, MD;  Location: [facility];   Service: Endoscopy;;  colon   ROTATOR CUFF REPAIR  2004   Right   TUBAL LIGATION      Family History  Problem Relation Age of Onset   Cancer Mother 60       Question cervical/ovarian   Arthritis Mother    Kidney failure Sister 46       born with one kidney, other kidney injured by mva   Kidney failure Sister    Lupus Sister    Diabetes Sister    Thyroid cancer Daughter    Arthritis Brother    Hearing loss Brother    Scoliosis Brother    Celiac disease Neg Hx    Inflammatory bowel disease Neg Hx     Allergies as of 04/19/2023 - Review Complete 04/19/2023  Allergen Reaction Noted   Diphenhydramine hcl Anaphylaxis 10/22/2012   Morphine Rash 10/22/2012   Statins Other (See Comments) 02/25/2016   Red yeast rice [cholestin] Rash 02/12/2018   Triclosan Rash 10/22/2012   Other  02/25/2016   Benzocaine Rash 10/22/2012   Latex Rash 02/25/2016   Sulfa antibiotics Rash 10/22/2012    Social History   Socioeconomic History   Marital status: Widowed    Spouse name: Not on file   Number of children: 1   Years of education: Not on file   Highest education level: Not on file  Occupational History   Occupation: PT for [employer], OR/ER nurse  Tobacco Use   Smoking status: Some Days    Current packs/day: 0.25    Average packs/day: 0.3 packs/day for 50.0 years (12.5 ttl pk-yrs)    Types: Cigarettes   Smokeless tobacco: Never   Tobacco comments:    Smoker of 1 ppd x 50 years - quit 03/2017 - started back light smoker currently 4-5 per day  Vaping Use   Vaping status: Never Used  Substance and Sexual Activity   Alcohol use: No   Drug use: No   Sexual activity: Never  Other Topics Concern   Not on file  Social History Narrative   Lives alone.   Daughter lives 20 miles away in [place].   Social Determinants of Health   Financial Resource Strain: Low Risk  (10/02/2022)   Overall Financial Resource Strain (CARDIA)    Difficulty of Paying Living Expenses: Not hard at all  Food  Insecurity: No Food Insecurity (10/02/2022)   Hunger Vital Sign    Worried About Running Out of Food in the Last Year: Never true    Ran Out of Food in the Last Year: Never true  Transportation Needs: No Transportation Needs (10/02/2022)   PRAPARE - Transportation    Lack of  Transportation (Medical): No    Lack of Transportation (Non-Medical): No  Physical Activity: Insufficiently Active (10/02/2022)   Exercise Vital Sign    Days of Exercise per Week: 3 days    Minutes of Exercise per Session: 30 min  Stress: No Stress Concern Present (10/02/2022)   [employer] of Occupational Health - Occupational Stress Questionnaire    Feeling of Stress : Not at all  Social Connections: Socially Isolated (10/02/2022)   Social Connection and Isolation Panel [NHANES]    Frequency of Communication with Friends and Family: More than three times a week    Frequency of Social Gatherings with Friends and Family: Three times a week    Attends Religious Services: Never    Active Member of Clubs or Organizations: No    Attends Banker Meetings: Never    Marital Status: Widowed  Intimate Partner Violence: Not At Risk (10/02/2022)   Humiliation, Afraid, Rape, and Kick questionnaire    Fear of Current or Ex-Partner: No    Emotionally Abused: No    Physically Abused: No    Sexually Abused: No     Review of Systems   Gen: Denies any fever, chills, fatigue, weight loss, lack of appetite.  CV: Denies chest pain, heart palpitations, peripheral edema, syncope.  Resp: + Dyspnea on exertion.  Denies shortness of breath at rest. Denies wheezing or cough.  GI: see HPI GU : Denies urinary burning, urinary frequency, urinary hesitancy MS: Denies joint pain, muscle weakness, cramps, or limitation of movement.  Derm: Denies rash, itching, dry skin Psych: Denies depression, anxiety, memory loss, and confusion Heme: Denies bruising, bleeding, and enlarged lymph nodes.   Physical Exam   BP 136/74 (BP  Location: Right Arm, Patient Position: Sitting, Cuff Size: Normal)   Pulse 76   Temp 97.9 F (36.6 C) (Temporal)   Ht 5' 2.5" (1.588 m)   Wt 164 lb 9.6 oz (74.7 kg)   BMI 29.63 kg/m   General:   Alert and oriented. Pleasant and cooperative. Well-nourished and well-developed.  Head:  Normocephalic and atraumatic. Eyes:  Without icterus, sclera clear and conjunctiva pink.  Ears:  Normal auditory acuity. Mouth:  No deformity or lesions, oral mucosa pink.  Lungs: No distress.  Clear to auscultation on inspiratory, expiratory wheezing noted bilaterally. Heart:  S1, S2 present without murmurs appreciated.  Rectal:  Deferred  Msk:  Symmetrical without gross deformities. Normal posture. Extremities:  Without edema. Neurologic:  Alert and  oriented x4;  grossly normal neurologically. Skin:  Intact without significant lesions or rashes. Psych:  Alert and cooperative. Normal mood and affect.   Assessment   Erin Guerrero is a 72 y.o. female with a history of COPD, cataracts, chronic bronchitis, HTN, HLD, and tobacco use presenting today for evaluation prior to scheduling colonoscopy.  History of colon polyp: Last colonoscopy in 2017 with 1 polyp removed from the cecum.  Pathology revealed benign polypoid tissue.  She was recommended to have 7-year surveillance and is currently due.  No alarm symptoms present at this time, COPD appears to be well-controlled, mild expiratory wheezing today on exam which is expected but denies any significant shortness of breath.  We will proceed with scheduling surveillance colonoscopy.  PLAN   Proceed with colonoscopy with propofol by Dr. Jena Gauss in near future: the risks, benefits, and alternatives have been discussed with the patient in detail. The patient states understanding and desires to proceed.  ASA 3 Follow up as needed.    Brooke Bonito,  MSN, FNP-BC, AGACNP-BC [facility]

## 2023-04-26 ENCOUNTER — Encounter (INDEPENDENT_AMBULATORY_CARE_PROVIDER_SITE_OTHER): Payer: Self-pay | Admitting: *Deleted

## 2023-04-26 MED ORDER — NA SULFATE-K SULFATE-MG SULF 17.5-3.13-1.6 GM/177ML PO SOLN: ORAL | 0 refills | Status: DC

## 2023-05-14 ENCOUNTER — Ambulatory Visit (HOSPITAL_COMMUNITY)
Admission: RE | Admit: 2023-05-14 | Discharge: 2023-05-14 | Disposition: A | Discharge status: Home / Self Care | Payer: Medicare Other | Source: Ambulatory Visit | Attending: Family Medicine | Admitting: Family Medicine

## 2023-05-14 DIAGNOSIS — Z1231 Encounter for screening mammogram for malignant neoplasm of breast: Secondary | ICD-10-CM | POA: Insufficient documentation

## 2023-06-01 NOTE — Patient Instructions (Signed)
   Your procedure is scheduled on: 06/06/2023  Report to [facility] Main Entrance at    9:30 AM.  Call this number if you have problems the morning of surgery: [phone]   Remember:              Follow Directions on the letter you received from Your Physician's office regarding the Bowel Prep              No Smoking the day of Procedure :   Take these medicines the morning of surgery with A SIP OF WATER: none  Use inhalers if needed   Do not wear jewelry, make-up or nail polish.    Do not bring valuables to the hospital.  Contacts, dentures or bridgework may not be worn into surgery.  .   Patients discharged the day of surgery will not be allowed to drive home.     Colonoscopy, Adult, Care After This sheet gives you information about how to care for yourself after your procedure. Your health care provider may also give you more specific instructions. If you have problems or questions, contact your health care provider. What can I expect after the procedure? After the procedure, it is common to have: A small amount of blood in your stool for 24 hours after the procedure. Some gas. Mild abdominal cramping or bloating.  Follow these instructions at home: General instructions  For the first 24 hours after the procedure: Do not drive or use machinery. Do not sign important documents. Do not drink alcohol. Do your regular daily activities at a slower pace than normal. Eat soft, easy-to-digest foods. Rest often. Take over-the-counter or prescription medicines only as told by your health care provider. It is up to you to get the results of your procedure. Ask your health care provider, or the department performing the procedure, when your results will be ready. Relieving cramping and bloating Try walking around when you have cramps or feel bloated. Apply heat to your abdomen as told by your health care provider. Use a heat source that your health care provider recommends, such  as a moist heat pack or a heating pad. Place a towel between your skin and the heat source. Leave the heat on for 20-30 minutes. Remove the heat if your skin turns bright red. This is especially important if you are unable to feel pain, heat, or cold. You may have a greater risk of getting burned. Eating and drinking Drink enough fluid to keep your urine clear or pale yellow. Resume your normal diet as instructed by your health care provider. Avoid heavy or fried foods that are hard to digest. Avoid drinking alcohol for as long as instructed by your health care provider. Contact a health care provider if: You have blood in your stool 2-3 days after the procedure. Get help right away if: You have more than a small spotting of blood in your stool. You pass large blood clots in your stool. Your abdomen is swollen. You have nausea or vomiting. You have a fever. You have increasing abdominal pain that is not relieved with medicine. This information is not intended to replace advice given to you by your health care provider. Make sure you discuss any questions you have with your health care provider. Document Released: 01/18/2004 Document Revised: 02/28/2016 Document Reviewed: 08/17/2015 Elsevier Interactive Patient Education  [employer].

## 2023-06-03 ENCOUNTER — Other Ambulatory Visit: Payer: Self-pay | Admitting: Family Medicine

## 2023-06-04 ENCOUNTER — Other Ambulatory Visit: Payer: Self-pay

## 2023-06-04 ENCOUNTER — Encounter (HOSPITAL_COMMUNITY): Payer: Self-pay

## 2023-06-04 ENCOUNTER — Encounter (HOSPITAL_COMMUNITY)
Admission: RE | Admit: 2023-06-04 | Discharge: 2023-06-04 | Disposition: A | Discharge status: Home / Self Care | Payer: Medicare Other | Source: Ambulatory Visit | Attending: Internal Medicine | Admitting: Internal Medicine

## 2023-06-04 VITALS — BP 149/82 | HR 77 | Temp 97.7°F | Resp 18 | Ht 62.5 in | Wt 164.7 lb

## 2023-06-04 DIAGNOSIS — Z01810 Encounter for preprocedural cardiovascular examination: Secondary | ICD-10-CM | POA: Diagnosis not present

## 2023-06-04 DIAGNOSIS — I10 Essential (primary) hypertension: Secondary | ICD-10-CM | POA: Diagnosis not present

## 2023-06-06 ENCOUNTER — Ambulatory Visit (HOSPITAL_COMMUNITY): Payer: Medicare Other | Admitting: Certified Registered Nurse Anesthetist

## 2023-06-06 ENCOUNTER — Encounter (HOSPITAL_COMMUNITY): Payer: Self-pay | Admitting: Internal Medicine

## 2023-06-06 ENCOUNTER — Ambulatory Visit (HOSPITAL_COMMUNITY)
Admission: RE | Admit: 2023-06-06 | Discharge: 2023-06-06 | Disposition: A | Discharge status: Home / Self Care | Payer: Medicare Other | Attending: Internal Medicine | Admitting: Internal Medicine

## 2023-06-06 ENCOUNTER — Encounter (HOSPITAL_COMMUNITY)
Admission: RE | Disposition: A | Discharge status: Home / Self Care | Payer: Self-pay | Source: Home / Self Care | Attending: Internal Medicine

## 2023-06-06 ENCOUNTER — Other Ambulatory Visit: Payer: Self-pay

## 2023-06-06 DIAGNOSIS — J449 Chronic obstructive pulmonary disease, unspecified: Secondary | ICD-10-CM | POA: Insufficient documentation

## 2023-06-06 DIAGNOSIS — F17210 Nicotine dependence, cigarettes, uncomplicated: Secondary | ICD-10-CM | POA: Insufficient documentation

## 2023-06-06 DIAGNOSIS — Z860100 Personal history of colon polyps, unspecified: Secondary | ICD-10-CM

## 2023-06-06 DIAGNOSIS — Z1211 Encounter for screening for malignant neoplasm of colon: Secondary | ICD-10-CM | POA: Insufficient documentation

## 2023-06-06 DIAGNOSIS — I10 Essential (primary) hypertension: Secondary | ICD-10-CM | POA: Insufficient documentation

## 2023-06-06 DIAGNOSIS — K640 First degree hemorrhoids: Secondary | ICD-10-CM | POA: Diagnosis not present

## 2023-06-06 DIAGNOSIS — Z860101 Personal history of adenomatous and serrated colon polyps: Secondary | ICD-10-CM | POA: Diagnosis not present

## 2023-06-06 DIAGNOSIS — K648 Other hemorrhoids: Secondary | ICD-10-CM

## 2023-06-06 DIAGNOSIS — F17200 Nicotine dependence, unspecified, uncomplicated: Secondary | ICD-10-CM | POA: Diagnosis not present

## 2023-06-06 HISTORY — PX: COLONOSCOPY WITH PROPOFOL: SHX5780

## 2023-06-06 SURGERY — COLONOSCOPY WITH PROPOFOL
Anesthesia: General

## 2023-06-06 MED ORDER — PROPOFOL 10 MG/ML IV BOLUS
INTRAVENOUS | Status: DC | PRN
  Administered 2023-06-06: 60 mg via INTRAVENOUS
  Administered 2023-06-06: 150 ug/kg/min via INTRAVENOUS

## 2023-06-06 MED ORDER — LACTATED RINGERS IV SOLN: INTRAVENOUS | Status: DC

## 2023-06-06 MED ORDER — PROPOFOL 500 MG/50ML IV EMUL
INTRAVENOUS | Status: AC
  Filled 2023-06-06: qty 50

## 2023-06-06 NOTE — Discharge Instructions (Signed)
  Colonoscopy Discharge Instructions  Read the instructions outlined below and refer to this sheet in the next few weeks. These discharge instructions provide you with general information on caring for yourself after you leave the hospital. Your doctor may also give you specific instructions. While your treatment has been planned according to the most current medical practices available, unavoidable complications occasionally occur. If you have any problems or questions after discharge, call Dr. Jena Gauss at [phone]. ACTIVITY You may resume your regular activity, but move at a slower pace for the next 24 hours.  Take frequent rest periods for the next 24 hours.  Walking will help get rid of the air and reduce the bloated feeling in your belly (abdomen).  No driving for 24 hours (because of the medicine (anesthesia) used during the test).   Do not sign any important legal documents or operate any machinery for 24 hours (because of the anesthesia used during the test).  NUTRITION Drink plenty of fluids.  You may resume your normal diet as instructed by your doctor.  Begin with a light meal and progress to your normal diet. Heavy or fried foods are harder to digest and may make you feel sick to your stomach (nauseated).  Avoid alcoholic beverages for 24 hours or as instructed.  MEDICATIONS You may resume your normal medications unless your doctor tells you otherwise.  WHAT YOU CAN EXPECT TODAY Some feelings of bloating in the abdomen.  Passage of more gas than usual.  Spotting of blood in your stool or on the toilet paper.  IF YOU HAD POLYPS REMOVED DURING THE COLONOSCOPY: No aspirin products for 7 days or as instructed.  No alcohol for 7 days or as instructed.  Eat a soft diet for the next 24 hours.  FINDING OUT THE RESULTS OF YOUR TEST Not all test results are available during your visit. If your test results are not back during the visit, make an appointment with your caregiver to find out the  results. Do not assume everything is normal if you have not heard from your caregiver or the medical facility. It is important for you to follow up on all of your test results.  SEEK IMMEDIATE MEDICAL ATTENTION IF: You have more than a spotting of blood in your stool.  Your belly is swollen (abdominal distention).  You are nauseated or vomiting.  You have a temperature over 101.  You have abdominal pain or discomfort that is severe or gets worse throughout the day.     no polyps found today  A future colonoscopy is not recommended unless new symptoms develop  at patient request, I called Melany at [phone] -  reviewed findings and recommendations

## 2023-06-06 NOTE — H&P (Signed)
@LOGO @   Primary Care Physician:  Dettinger, Elige Radon, MD Primary Gastroenterologist:  Dr. Jena Gauss  Pre-Procedure History & Physical: HPI:  Erin Guerrero is a 72 y.o. female here for surveillance colonoscopy distant history colonic adenoma; benign polyp removed 2017.  No bowel symptoms at this time.  She continues to smoke.  Past Medical History:  Diagnosis Date   Acute respiratory failure (HCC)    CAP (community acquired pneumonia)    Cataract    Chronic bronchitis (HCC)    COPD (chronic obstructive pulmonary disease) (HCC)    HTN (hypertension)    Hyperlipidemia    Influenza    Osteoarthritis    Pulmonary nodule    Seasonal allergies    Tobacco use     Past Surgical History:  Procedure Laterality Date   BREAST BIOPSY  2014   Benign   Cervical cryo-treatment  1972   CHOLECYSTECTOMY  1998   COLONOSCOPY     Polyps   COLONOSCOPY N/A 03/16/2016   Procedure: COLONOSCOPY;  Surgeon: Corbin Ade, MD;  Location: [facility];  Service: Endoscopy;  Laterality: N/A;  1:30 PM   DILATION AND CURETTAGE OF UTERUS     GANGLION CYST EXCISION  1970   Left wrist   GANGLION CYST EXCISION     Right hand and left ring finger   LUMBAR LAMINECTOMY  1985, 1987   MENISCECTOMY  2008   POLYPECTOMY  03/16/2016   Procedure: POLYPECTOMY;  Surgeon: Corbin Ade, MD;  Location: [facility];  Service: Endoscopy;;  colon   ROTATOR CUFF REPAIR  2004   Right   TUBAL LIGATION      Prior to Admission medications   Medication Sig Start Date End Date Taking? Authorizing Provider  ascorbic acid (VITAMIN C) 500 MG tablet Take 500 mg by mouth daily.   Yes [provider]  aspirin EC 81 MG tablet Take 81 mg by mouth daily.   Yes [provider]  Calcium Carbonate-Vit D-Min (CALCIUM 1200 PO) Take 1,200 mg by mouth daily.   Yes [provider]  cetirizine (ZYRTEC) 10 MG tablet Take 10 mg by mouth daily as needed.    Yes [provider]  cholecalciferol (VITAMIN  D) 1000 units tablet Take 2,000 Units by mouth daily.   Yes [provider]  clobetasol ointment (TEMOVATE) 0.05 % APPLY EXTERNALLY TO THE AFFECTED AREA TWICE DAILY 10/02/19  Yes Daphine Deutscher, Mary-Margaret, FNP  ezetimibe (ZETIA) 10 MG tablet Take 1 tablet (10 mg total) by mouth daily. 04/06/23  Yes Dettinger, Elige Radon, MD  ibuprofen (ADVIL,MOTRIN) 800 MG tablet Take 800 mg by mouth every 8 (eight) hours as needed.   Yes [provider]  ipratropium (ATROVENT HFA) 17 MCG/ACT inhaler Inhale 2 puffs into the lungs every 6 (six) hours as needed for wheezing. 04/02/23  Yes Dettinger, Elige Radon, MD  irbesartan (AVAPRO) 150 MG tablet Take 1 tablet (150 mg total) by mouth daily. 04/02/23  Yes Dettinger, Elige Radon, MD  Na Sulfate-K Sulfate-Mg Sulf 17.5-3.13-1.6 GM/177ML SOLN As directed 04/26/23  Yes Yamato Kopf, Gerrit Friends, MD  Tiotropium Bromide Monohydrate (SPIRIVA RESPIMAT) 2.5 MCG/ACT AERS INHALE 2 PUFFS INTO THE LUNGS ONCE DAILY. 06/04/23  Yes Dettinger, Elige Radon, MD  triamcinolone (NASACORT) 55 MCG/ACT AERO nasal inhaler Place 2 sprays into the nose daily as needed (allergies).    Yes [provider]  TURMERIC CURCUMIN PO Take 1 tablet by mouth daily.   Yes [provider]  diazepam (VALIUM) 5 MG  tablet One every 6 hours for muscle spasm 01/21/21   Dettinger, Elige Radon, MD  traMADol (ULTRAM) 50 MG tablet Take 1 tablet (50 mg total) by mouth 3 (three) times daily as needed. Patient not taking: Reported on 04/19/2023 03/23/22   Valeria Batman, MD    Allergies as of 04/26/2023 - Review Complete 04/19/2023  Allergen Reaction Noted   Diphenhydramine hcl Anaphylaxis 10/22/2012   Morphine Rash 10/22/2012   Statins Other (See Comments) 02/25/2016   Red yeast rice [cholestin] Rash 02/12/2018   Triclosan Rash 10/22/2012   Other  02/25/2016   Benzocaine Rash 10/22/2012   Latex Rash 02/25/2016   Sulfa antibiotics Rash 10/22/2012    Family History  Problem Relation Age of Onset    Cancer Mother 52       Question cervical/ovarian   Arthritis Mother    Kidney failure Sister 63       born with one kidney, other kidney injured by mva   Kidney failure Sister    Lupus Sister    Diabetes Sister    Arthritis Brother    Hearing loss Brother    Scoliosis Brother    Thyroid cancer Daughter    Celiac disease Neg Hx    Inflammatory bowel disease Neg Hx    Cancer - Colon Neg Hx     Social History   Socioeconomic History   Marital status: Widowed    Spouse name: Not on file   Number of children: 1   Years of education: Not on file   Highest education level: Not on file  Occupational History   Occupation: PT for [employer], OR/ER nurse  Tobacco Use   Smoking status: Every Day    Current packs/day: 1.00    Average packs/day: 0.6 packs/day for 100.0 years (62.5 ttl pk-yrs)    Types: Cigarettes    Start date: 06/03/1973   Smokeless tobacco: Never   Tobacco comments:    Smoker of 1 ppd x 50 years - quit 03/2017 - started back light smoker currently 4-5 per day  Vaping Use   Vaping status: Never Used  Substance and Sexual Activity   Alcohol use: No   Drug use: No   Sexual activity: Never  Other Topics Concern   Not on file  Social History Narrative   Lives alone.   Daughter lives 20 miles away in [place].   Social Drivers of Corporate investment banker Strain: Low Risk  (10/02/2022)   Overall Financial Resource Strain (CARDIA)    Difficulty of Paying Living Expenses: Not hard at all  Food Insecurity: No Food Insecurity (10/02/2022)   Hunger Vital Sign    Worried About Running Out of Food in the Last Year: Never true    Ran Out of Food in the Last Year: Never true  Transportation Needs: No Transportation Needs (10/02/2022)   PRAPARE - Administrator, Civil Service (Medical): No    Lack of Transportation (Non-Medical): No  Physical Activity: Insufficiently Active (10/02/2022)   Exercise Vital Sign    Days of Exercise per Week: 3 days    Minutes of  Exercise per Session: 30 min  Stress: No Stress Concern Present (10/02/2022)   [employer] of Occupational Health - Occupational Stress Questionnaire    Feeling of Stress : Not at all  Social Connections: Socially Isolated (10/02/2022)   Social Connection and Isolation Panel [NHANES]    Frequency of Communication with Friends and Family: More than three times a week  Frequency of Social Gatherings with Friends and Family: Three times a week    Attends Religious Services: Never    Active Member of Clubs or Organizations: No    Attends Banker Meetings: Never    Marital Status: Widowed  Intimate Partner Violence: Not At Risk (10/02/2022)   Humiliation, Afraid, Rape, and Kick questionnaire    Fear of Current or Ex-Partner: No    Emotionally Abused: No    Physically Abused: No    Sexually Abused: No    Review of Systems: See HPI, otherwise negative ROS  Physical Exam: BP (!) 156/69   Pulse 79   Temp 97.9 F (36.6 C) (Oral)   Resp 18   Ht 5' 2.5" (1.588 m)   Wt 74.7 kg   SpO2 98%   BMI 29.64 kg/m  General:   Alert,  Well-developed, well-nourished, pleasant and cooperative in NAD Neck:  Supple; no masses or thyromegaly. No significant cervical adenopathy. Lungs:  Clear throughout to auscultation.   No wheezes, crackles, or rhonchi. No acute distress. Heart:  Regular rate and rhythm; no murmurs, clicks, rubs,  or gallops. Abdomen: Non-distended, normal bowel sounds.  Soft and nontender without appreciable mass or hepatosplenomegaly.   Impression/Plan: 72 year old lady with distant history of colonic adenomas here for surveillance colonoscopy. The risks, benefits, limitations, alternatives and imponderables have been reviewed with the patient. Questions have been answered. All parties are agreeable.      Notice: This dictation was prepared with Dragon dictation along with smaller phrase technology. Any transcriptional errors that result from this process are  unintentional and may not be corrected upon review.

## 2023-06-06 NOTE — Anesthesia Procedure Notes (Signed)
Procedure Name: General with mask airway Date/Time: 06/06/2023 11:16 AM  Performed by: Cy Blamer, CRNAPre-anesthesia Checklist: Patient identified, Emergency Drugs available, Suction available, Patient being monitored and Timeout performed Patient Re-evaluated:Patient Re-evaluated prior to induction Oxygen Delivery Method: Nasal cannula Induction Type: IV induction Placement Confirmation: positive ETCO2 Dental Injury: Teeth and Oropharynx as per pre-operative assessment

## 2023-06-06 NOTE — Anesthesia Preprocedure Evaluation (Signed)
Anesthesia Evaluation  Patient identified by MRN, date of birth, ID band Patient awake    Reviewed: Allergy & Precautions, H&P , NPO status , Patient's Chart, lab work & pertinent test results, reviewed documented beta blocker date and time   Airway Mallampati: II  TM Distance: >3 FB Neck ROM: full    Dental no notable dental hx.    Pulmonary neg pulmonary ROS, pneumonia, COPD, Current Smoker and Patient abstained from smoking.   Pulmonary exam normal breath sounds clear to auscultation       Cardiovascular Exercise Tolerance: Good hypertension, negative cardio ROS  Rhythm:regular Rate:Normal     Neuro/Psych  Neuromuscular disease negative neurological ROS  negative psych ROS   GI/Hepatic negative GI ROS, Neg liver ROS,,,  Endo/Other  negative endocrine ROS    Renal/GU negative Renal ROS  negative genitourinary   Musculoskeletal   Abdominal   Peds  Hematology negative hematology ROS (+)   Anesthesia Other Findings   Reproductive/Obstetrics negative OB ROS                             Anesthesia Physical Anesthesia Plan  ASA: 3  Anesthesia Plan: General   Post-op Pain Management:    Induction:   PONV Risk Score and Plan: Propofol infusion  Airway Management Planned:   Additional Equipment:   Intra-op Plan:   Post-operative Plan:   Informed Consent: I have reviewed the patients History and Physical, chart, labs and discussed the procedure including the risks, benefits and alternatives for the proposed anesthesia with the patient or authorized representative who has indicated his/her understanding and acceptance.     Dental Advisory Given  Plan Discussed with: CRNA  Anesthesia Plan Comments:        Anesthesia Quick Evaluation

## 2023-06-06 NOTE — Transfer of Care (Signed)
Immediate Anesthesia Transfer of Care Note  Patient: Erin Guerrero  Procedure(s) Performed: COLONOSCOPY WITH PROPOFOL  Patient Location: Short Stay  Anesthesia Type:General  Level of Consciousness: awake, alert , and oriented  Airway & Oxygen Therapy: Patient Spontanous Breathing  Post-op Assessment: Report given to RN, Post -op Vital signs reviewed and stable, Patient moving all extremities X 4, and Patient able to stick tongue midline  Post vital signs: Reviewed and stable  Last Vitals:  Vitals Value Taken Time  BP 101/54   Temp 98.6   Pulse 72   Resp 17   SpO2 98     Last Pain:  Vitals:   06/06/23 1115  TempSrc:   PainSc: 0-No pain      Patients Stated Pain Goal: 8 (06/06/23 1022)  Complications: No notable events documented.

## 2023-06-06 NOTE — Op Note (Signed)
[facility] Patient Name: Erin Guerrero Procedure Date: 06/06/2023 11:00 AM MRN: [id number] Date of Birth: 1950/12/08 Attending MD: Gennette Pac , MD, [id number] CSN: [id number] Age: 72 Admit Type: Outpatient Procedure:                Colonoscopy Indications:              High risk colon cancer surveillance: Personal                            history of colonic polyps Providers:                Gennette Pac, MD, Doristine Mango, RN,                            Tammy Vaught, RN, Zena Amos Referring MD:              Medicines:                Propofol per Anesthesia Complications:            No immediate complications. Estimated Blood Loss:     Estimated blood loss: none. Estimated blood loss:                            none. Procedure:                Pre-Anesthesia Assessment:                           - Prior to the procedure, a History and Physical                            was performed, and patient medications and                            allergies were reviewed. The patient's tolerance of                            previous anesthesia was also reviewed. The risks                            and benefits of the procedure and the sedation                            options and risks were discussed with the patient.                            All questions were answered, and informed consent                            was obtained. Prior Anticoagulants: The patient has                            taken no anticoagulant or antiplatelet agents. ASA  Grade Assessment: II - A patient with mild systemic                            disease. After reviewing the risks and benefits,                            the patient was deemed in satisfactory condition to                            undergo the procedure.                           After obtaining informed consent, the colonoscope                            was passed under direct  vision. Throughout the                            procedure, the patient's blood pressure, pulse, and                            oxygen saturations were monitored continuously. The                            [id number] scope was introduced through the                            anus and advanced to the the cecum, identified by                            appendiceal orifice and ileocecal valve. The                            colonoscopy was performed without difficulty. The                            patient tolerated the procedure well. The quality                            of the bowel preparation was adequate. The                            ileocecal valve, appendiceal orifice, and rectum                            were photographed. The entire colon was well                            visualized. Scope In: 11:20:13 AM Scope Out: 11:32:13 AM Scope Withdrawal Time: 0 hours 6 minutes 21 seconds  Total Procedure Duration: 0 hours 12 minutes 0 seconds  Findings:      The perianal and digital rectal examinations were normal.      Non-bleeding internal hemorrhoids were found during retroflexion. The  hemorrhoids were mild, small and Grade I (internal hemorrhoids that do       not prolapse).      The exam was otherwise without abnormality on direct and retroflexion       views. Impression:               - Non-bleeding internal hemorrhoids.                           - The examination was otherwise normal on direct                            and retroflexion views.                           - No specimens collected. Moderate Sedation:      Moderate (conscious) sedation was personally administered by an       anesthesia professional. The following parameters were monitored: oxygen       saturation, heart rate, blood pressure, respiratory rate, EKG, adequacy       of pulmonary ventilation, and response to care. Recommendation:           - Patient has a contact number available  for                            emergencies. The signs and symptoms of potential                            delayed complications were discussed with the                            patient. Return to normal activities tomorrow.                            Written discharge instructions were provided to the                            patient.                           - Advance diet as tolerated.                           - Continue present medications.                           - No repeat colonoscopy due to current age (110                            years or older). Procedure Code(s):        --- Professional ---                           [phone], Colonoscopy, flexible; diagnostic, including                            collection of specimen(s) by brushing or washing,  when performed (separate procedure) Diagnosis Code(s):        --- Professional ---                           Z86.010, Personal history of colonic polyps                           K64.0, First degree hemorrhoids CPT copyright 2022 American Medical Association. All rights reserved. The codes documented in this report are preliminary and upon coder review may  be revised to meet current compliance requirements. Erin Guerrero. Erin Wiederholt, MD Gennette Pac, MD 06/06/2023 11:41:28 AM This report has been signed electronically. Number of Addenda: 0

## 2023-06-08 NOTE — Anesthesia Postprocedure Evaluation (Signed)
Anesthesia Post Note  Patient: SHAELI ANGELICA  Procedure(s) Performed: COLONOSCOPY WITH PROPOFOL  Patient location during evaluation: Phase II Anesthesia Type: General Level of consciousness: awake Pain management: pain level controlled Vital Signs Assessment: post-procedure vital signs reviewed and stable Respiratory status: spontaneous breathing and respiratory function stable Cardiovascular status: blood pressure returned to baseline and stable Postop Assessment: no headache and no apparent nausea or vomiting Anesthetic complications: no Comments: Late entry   No notable events documented.   Last Vitals:  Vitals:   06/06/23 1139 06/06/23 1143  BP: (!) 101/54 133/84  Pulse: 74 76  Resp: 15 13  Temp: 36.6 C   SpO2: 100% 100%    Last Pain:  Vitals:   06/06/23 1139  TempSrc: Oral  PainSc:                  Windell Norfolk

## 2023-06-28 ENCOUNTER — Encounter (HOSPITAL_COMMUNITY): Payer: Self-pay | Admitting: Internal Medicine

## 2023-07-05 DIAGNOSIS — H524 Presbyopia: Secondary | ICD-10-CM | POA: Diagnosis not present

## 2023-08-15 DIAGNOSIS — H2513 Age-related nuclear cataract, bilateral: Secondary | ICD-10-CM | POA: Diagnosis not present

## 2023-08-15 DIAGNOSIS — H5213 Myopia, bilateral: Secondary | ICD-10-CM | POA: Diagnosis not present

## 2023-08-15 DIAGNOSIS — H43823 Vitreomacular adhesion, bilateral: Secondary | ICD-10-CM | POA: Diagnosis not present

## 2023-08-20 ENCOUNTER — Other Ambulatory Visit: Payer: Self-pay | Admitting: Family Medicine

## 2023-08-20 DIAGNOSIS — I10 Essential (primary) hypertension: Secondary | ICD-10-CM

## 2023-09-21 ENCOUNTER — Encounter (HOSPITAL_COMMUNITY)
Admission: RE | Admit: 2023-09-21 | Discharge: 2023-09-21 | Disposition: A | Discharge status: Home / Self Care | Source: Ambulatory Visit | Attending: Optometry | Admitting: Optometry

## 2023-09-21 DIAGNOSIS — H2511 Age-related nuclear cataract, right eye: Secondary | ICD-10-CM | POA: Diagnosis not present

## 2023-09-27 NOTE — H&P (Signed)
 Surgical History & Physical  Patient Name: Erin Guerrero  DOB: 10/19/1950  Surgery: Cataract extraction with intraocular lens implant phacoemulsification; Right Eye Surgeon: Pecolia Ades MD Surgery Date: 09/28/2023 Pre-Op Date: 08/15/2023  HPI: A 61 Yr. old female patient 1. The patient is a new patient present for a Cataract Evaluation. The patient complains of nighttime light. car headlights, street lamps etc. glare causing poor vision, which began 6 months ago. Both eyes are affected. The episode is constant. The patient describes foggy symptoms affecting their eyes/vision. The condition\'s severity decreased since last visit. This is negatively affecting the patient\'s quality of life and the patient is unable to function adequately in life with the current level of vision.HPI was performed by Pecolia Ades .  Medical History: Cataracts  High Blood Pressure  Review of Systems Cardiovascular High Blood Pressure, high cholesterol Respiratory COPD All recorded systems are negative except as noted above.  Social Current some day smoker   Medication Ezetimibe, Irbesartan, Spiriva Respimat, Atrovent HFA, Prednisolone acetate, Ciprofloxacin HCl  Sx/Procedures None  Drug Allergies  Diphenhydramine HCl, Morphine, Sulfa, Benzocaine (bulk), Triclosan, Latex gloves  History & Physical: Heent: cataracts NECK: supple without bruits LUNGS: lungs clear to auscultation CV: regular rate and rhythm Abdomen: soft and non-tender  Impression & Plan: Assessment: 1.  CATARACT NUCLEAR SCLEROSIS AGE RELATED; Both Eyes (H25.13) 2.  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  Myopia ; Both Eyes (H52.13) 4.  Presbyopia ; Both Eyes (H52.4)  Plan: 1.  Cataracts are visually significant and account for the patient's complaints. Discussed all risks, benefits, procedures and recovery, including infection, loss of vision and eye, need for glasses after surgery or additional procedures.  Patient understands changing glasses will not improve vision. Patient indicated understanding of procedure. All questions answered. Patient desires to have surgery, recommend phacoemulsification with intraocular lens. Patient to have preliminary testing necessary (Argos/IOL Master, Mac OCT, TOPO) Educational materials provided:Cataract.  Plan: - Return for repeat measurements due to discrepancy in astigmatism today - Proceed with cataract surgery OD followed OS - Plan for best distance target with DIB00 - No DM, no fuchs, no prior eye surgery - good dilation - dextenza  2.  Mild VMT centrally, reassess after cataract extraction  3.  Per Dr. Earlene Plater  4.  Per Dr. Earlene Plater

## 2023-09-28 ENCOUNTER — Encounter (HOSPITAL_COMMUNITY)
Admission: RE | Disposition: A | Discharge status: Home / Self Care | Payer: Self-pay | Source: Home / Self Care | Attending: Optometry

## 2023-09-28 ENCOUNTER — Ambulatory Visit (HOSPITAL_COMMUNITY)
Admission: RE | Admit: 2023-09-28 | Discharge: 2023-09-28 | Disposition: A | Discharge status: Home / Self Care | Attending: Optometry | Admitting: Optometry

## 2023-09-28 ENCOUNTER — Ambulatory Visit (HOSPITAL_BASED_OUTPATIENT_CLINIC_OR_DEPARTMENT_OTHER): Admitting: Certified Registered"

## 2023-09-28 ENCOUNTER — Encounter (HOSPITAL_COMMUNITY): Payer: Self-pay | Admitting: Optometry

## 2023-09-28 ENCOUNTER — Ambulatory Visit (HOSPITAL_COMMUNITY): Admitting: Certified Registered"

## 2023-09-28 DIAGNOSIS — J449 Chronic obstructive pulmonary disease, unspecified: Secondary | ICD-10-CM | POA: Insufficient documentation

## 2023-09-28 DIAGNOSIS — H2511 Age-related nuclear cataract, right eye: Secondary | ICD-10-CM

## 2023-09-28 DIAGNOSIS — I10 Essential (primary) hypertension: Secondary | ICD-10-CM | POA: Insufficient documentation

## 2023-09-28 DIAGNOSIS — H5711 Ocular pain, right eye: Secondary | ICD-10-CM | POA: Diagnosis not present

## 2023-09-28 DIAGNOSIS — F17200 Nicotine dependence, unspecified, uncomplicated: Secondary | ICD-10-CM | POA: Diagnosis not present

## 2023-09-28 HISTORY — PX: CATARACT EXTRACTION W/PHACO: SHX586

## 2023-09-28 HISTORY — PX: INSERTION, STENT, DRUG-ELUTING, LACRIMAL CANALICULUS: SHX7453

## 2023-09-28 SURGERY — PHACOEMULSIFICATION, CATARACT, WITH IOL INSERTION
Anesthesia: Monitor Anesthesia Care | Site: Eye | Laterality: Right

## 2023-09-28 MED ORDER — POVIDONE-IODINE 5 % OP SOLN
OPHTHALMIC | Status: DC | PRN
  Administered 2023-09-28: 1 via OPHTHALMIC

## 2023-09-28 MED ORDER — PHENYLEPHRINE-KETOROLAC 1-0.3 % IO SOLN
INTRAOCULAR | Status: DC | PRN
  Administered 2023-09-28: 500 mL via OPHTHALMIC

## 2023-09-28 MED ORDER — MOXIFLOXACIN HCL 5 MG/ML IO SOLN
INTRAOCULAR | Status: DC | PRN
  Administered 2023-09-28: .2 mL via INTRACAMERAL

## 2023-09-28 MED ORDER — LACTATED RINGERS IV SOLN: INTRAVENOUS | Status: DC

## 2023-09-28 MED ORDER — TETRACAINE HCL 0.5 % OP SOLN
1.0000 [drp] | OPHTHALMIC | Status: AC
  Administered 2023-09-28 (×3): 1 [drp] via OPHTHALMIC

## 2023-09-28 MED ORDER — FENTANYL CITRATE (PF) 100 MCG/2ML IJ SOLN
INTRAMUSCULAR | Status: AC
  Filled 2023-09-28: qty 2

## 2023-09-28 MED ORDER — SIGHTPATH DOSE#1 NA HYALUR & NA CHOND-NA HYALUR IO KIT
PACK | INTRAOCULAR | Status: DC | PRN
  Administered 2023-09-28: 1 via OPHTHALMIC

## 2023-09-28 MED ORDER — LIDOCAINE HCL (PF) 1 % IJ SOLN
INTRAMUSCULAR | Status: DC | PRN
  Administered 2023-09-28: 1 mL

## 2023-09-28 MED ORDER — MIDAZOLAM HCL 2 MG/2ML IJ SOLN
INTRAMUSCULAR | Status: AC
  Filled 2023-09-28: qty 2

## 2023-09-28 MED ORDER — DEXAMETHASONE 0.4 MG OP INST
VAGINAL_INSERT | OPHTHALMIC | Status: AC
Start: 2023-09-28 — End: ?
  Filled 2023-09-28: qty 1

## 2023-09-28 MED ORDER — DEXAMETHASONE 0.4 MG OP INST
VAGINAL_INSERT | OPHTHALMIC | Status: DC | PRN
  Administered 2023-09-28: .4 mg via OPHTHALMIC

## 2023-09-28 MED ORDER — TROPICAMIDE 1 % OP SOLN
1.0000 [drp] | OPHTHALMIC | Status: AC
  Administered 2023-09-28 (×3): 1 [drp] via OPHTHALMIC

## 2023-09-28 MED ORDER — PHENYLEPHRINE HCL 2.5 % OP SOLN
1.0000 [drp] | OPHTHALMIC | Status: AC
Start: 2023-09-28 — End: 2023-09-28
  Administered 2023-09-28 (×3): 1 [drp] via OPHTHALMIC

## 2023-09-28 MED ORDER — MIDAZOLAM HCL 2 MG/2ML IJ SOLN
INTRAMUSCULAR | Status: DC | PRN
  Administered 2023-09-28: 1 mg via INTRAVENOUS

## 2023-09-28 MED ORDER — LIDOCAINE HCL 3.5 % OP GEL
1.0000 | Freq: Once | OPHTHALMIC | Status: AC
  Administered 2023-09-28: 1 via OPHTHALMIC

## 2023-09-28 MED ORDER — FENTANYL CITRATE (PF) 100 MCG/2ML IJ SOLN
INTRAMUSCULAR | Status: DC | PRN
  Administered 2023-09-28: 50 ug via INTRAVENOUS

## 2023-09-28 MED ORDER — BSS IO SOLN
INTRAOCULAR | Status: DC | PRN
  Administered 2023-09-28: 15 mL via INTRAOCULAR

## 2023-09-28 MED ORDER — STERILE WATER FOR IRRIGATION IR SOLN
Status: DC | PRN
  Administered 2023-09-28: 200 mL

## 2023-09-28 MED ORDER — SODIUM CHLORIDE 0.9% FLUSH
INTRAVENOUS | Status: DC | PRN
  Administered 2023-09-28: 8 mL via INTRAVENOUS

## 2023-09-28 SURGICAL SUPPLY — 14 items
CATARACT SUITE SIGHTPATH (MISCELLANEOUS) ×1 IMPLANT
CLOTH BEACON ORANGE TIMEOUT ST (SAFETY) ×1 IMPLANT
DRSG TEGADERM 4X4.75 (GAUZE/BANDAGES/DRESSINGS) ×1 IMPLANT
EYE SHIELD UNIVERSAL CLEAR (GAUZE/BANDAGES/DRESSINGS) IMPLANT
FEE CATARACT SUITE SIGHTPATH (MISCELLANEOUS) ×1 IMPLANT
GLOVE BIOGEL PI IND STRL 7.0 (GLOVE) ×2 IMPLANT
LENS IOL TECNIS EYHANCE 25.0 (Intraocular Lens) IMPLANT
NDL HYPO 18GX1.5 BLUNT FILL (NEEDLE) ×1 IMPLANT
NEEDLE HYPO 18GX1.5 BLUNT FILL (NEEDLE) ×1 IMPLANT
PAD ARMBOARD POSITIONER FOAM (MISCELLANEOUS) ×1 IMPLANT
POSITIONER HEAD 8X9X4 ADT (SOFTGOODS) ×1 IMPLANT
SYR TB 1ML LL NO SAFETY (SYRINGE) ×1 IMPLANT
TAPE SURG TRANSPORE 1 IN (GAUZE/BANDAGES/DRESSINGS) IMPLANT
WATER STERILE IRR 250ML POUR (IV SOLUTION) ×1 IMPLANT

## 2023-09-28 NOTE — Anesthesia Procedure Notes (Signed)
 Procedure Name: MAC Date/Time: 09/28/2023 7:31 AM  Performed by: Julian Reil, CRNAPre-anesthesia Checklist: Patient identified, Emergency Drugs available, Suction available and Patient being monitored Patient Re-evaluated:Patient Re-evaluated prior to induction Oxygen Delivery Method: Nasal cannula Placement Confirmation: positive ETCO2

## 2023-09-28 NOTE — Interval H&P Note (Signed)
 History and Physical Interval Note:  09/28/2023 7:18 AM  The H and P was reviewed and updated. The patient was examined.  No changes were found after exam.  The surgical eye was marked.   Erin Guerrero

## 2023-09-28 NOTE — Op Note (Signed)
 Date of procedure: 09/28/23  Pre-operative diagnosis: Visually significant age-related nuclear cataract, Right Eye (H25.11)  Post-operative diagnosis: Visually significant age-related nuclear cataract, Right Eye  Procedure: Removal of cataract via phacoemulsification and insertion of intra-ocular lens J&J DIBOO +25.0D into the capsular bag of the Right Eye  Attending surgeon: Pecolia Ades, MD  Anesthesia: MAC, Topical Akten  Complications: None  Estimated Blood Loss: <13mL (minimal)  Specimens: None  Implants:  Implant Name Type Inv. Item Serial No. Manufacturer Lot No. LRB No. Used Action  LENS IOL TECNIS EYHANCE 25.0 - [id number] Intraocular Lens LENS IOL TECNIS EYHANCE 25.0 [id number] SIGHTPATH  Right 1 Implanted    Indications:  Visually significant age-related cataract, Right Eye  Procedure:  The patient was seen and identified in the pre-operative area. The operative eye was identified and dilated.  The operative eye was marked.  Topical anesthesia was administered to the operative eye.     The patient was then to the operative suite and placed in the supine position.  A timeout was performed confirming the patient, procedure to be performed, and all other relevant information.   The patient's face was prepped and draped in the usual fashion for intra-ocular surgery.  A lid speculum was placed into the operative eye and the surgical microscope moved into place and focused.  A superotemporal paracentesis was created using a 20 gauge paracentesis blade.  BSS mixed with Omidria, followed by 1% lidocaine was injected into the anterior chamber.  Viscoelastic was injected into the anterior chamber.  A temporal clear-corneal main wound incision was created using a 2.47mm microkeratome.  A continuous curvilinear capsulorrhexis was initiated using an irrigating cystitome and completed using capsulorrhexis forceps.  Hydrodissection and hydrodeliniation were performed.  Viscoelastic was  injected into the anterior chamber.  A phacoemulsification handpiece and a chopper as a second instrument were used to remove the nucleus and epinucleus. The irrigation/aspiration handpiece was used to remove any remaining cortical material.   The capsular bag was reinflated with viscoelastic, checked, and found to be intact.  The intraocular lens was inserted into the capsular bag.  The irrigation/aspiration handpiece was used to remove any remaining viscoelastic.  The clear corneal wound and paracentesis wounds were then hydrated and checked with Weck-Cels to be watertight. Moxifloxacin was instilled into the anterior chamber.  The lid-speculum and drape were removed. The lower punctum was dilated, and the dextenza implant was inserted into it. The patient's face was cleaned with a wet and dry 4x4. A clear shield was taped over the eye. The patient was taken to the post-operative care unit in good condition, having tolerated the procedure well.  Post-Op Instructions: The patient will follow up at [facility] for a same day post-operative evaluation and will receive all other orders and instructions.

## 2023-09-28 NOTE — Anesthesia Preprocedure Evaluation (Signed)
 Anesthesia Evaluation  Patient identified by MRN, date of birth, ID band Patient awake    Reviewed: Allergy & Precautions, H&P , NPO status , Patient's Chart, lab work & pertinent test results, reviewed documented beta blocker date and time   Airway Mallampati: II  TM Distance: >3 FB Neck ROM: full    Dental no notable dental hx.    Pulmonary neg pulmonary ROS, Current Smoker and Patient abstained from smoking.   Pulmonary exam normal breath sounds clear to auscultation       Cardiovascular Exercise Tolerance: Good hypertension, negative cardio ROS  Rhythm:regular Rate:Normal     Neuro/Psych negative neurological ROS  negative psych ROS   GI/Hepatic negative GI ROS, Neg liver ROS,,,  Endo/Other  negative endocrine ROS    Renal/GU negative Renal ROS  negative genitourinary   Musculoskeletal   Abdominal   Peds  Hematology negative hematology ROS (+)   Anesthesia Other Findings   Reproductive/Obstetrics negative OB ROS                             Anesthesia Physical Anesthesia Plan  ASA: 2  Anesthesia Plan:    Post-op Pain Management:    Induction:   PONV Risk Score and Plan:   Airway Management Planned:   Additional Equipment:   Intra-op Plan:   Post-operative Plan:   Informed Consent: I have reviewed the patients History and Physical, chart, labs and discussed the procedure including the risks, benefits and alternatives for the proposed anesthesia with the patient or authorized representative who has indicated his/her understanding and acceptance.     Dental Advisory Given  Plan Discussed with: CRNA  Anesthesia Plan Comments:        Anesthesia Quick Evaluation

## 2023-09-28 NOTE — Discharge Instructions (Signed)
 Please discharge patient when stable, will follow up today with Dr. Ilsa Iha at the [facility] office immediately following discharge.  Leave shield in place until visit.  All paperwork with discharge instructions will be given at the office.  [facility] Address:  [address]  [place], [place] [address]  Dr. Chaya Jan Phone: [phone]

## 2023-09-28 NOTE — Transfer of Care (Signed)
 Immediate Anesthesia Transfer of Care Note  Patient: Erin Guerrero  Procedure(s) Performed: PHACOEMULSIFICATION, CATARACT, WITH IOL INSERTION (Right: Eye) INSERTION, STENT, DRUG-ELUTING, LACRIMAL CANALICULUS (Right: Eye)  Patient Location: Short Stay  Anesthesia Type:MAC  Level of Consciousness: awake, alert , and oriented  Airway & Oxygen Therapy: Patient Spontanous Breathing  Post-op Assessment: Report given to RN and Post -op Vital signs reviewed and stable  Post vital signs: Reviewed and stable  Last Vitals:  Vitals Value Taken Time  BP    Temp    Pulse    Resp    SpO2      Last Pain:  Vitals:   09/28/23 0656  PainSc: 0-No pain         Complications: No notable events documented.

## 2023-10-01 ENCOUNTER — Encounter (HOSPITAL_COMMUNITY): Payer: Self-pay | Admitting: Optometry

## 2023-10-02 DIAGNOSIS — H2512 Age-related nuclear cataract, left eye: Secondary | ICD-10-CM | POA: Diagnosis not present

## 2023-10-09 ENCOUNTER — Encounter (HOSPITAL_COMMUNITY): Payer: Self-pay

## 2023-10-09 ENCOUNTER — Other Ambulatory Visit: Payer: Self-pay

## 2023-10-09 ENCOUNTER — Encounter (HOSPITAL_COMMUNITY)
Admission: RE | Admit: 2023-10-09 | Discharge: 2023-10-09 | Disposition: A | Discharge status: Home / Self Care | Source: Ambulatory Visit | Attending: Optometry | Admitting: Optometry

## 2023-10-09 NOTE — H&P (Signed)
 Surgical History & Physical  Patient Name: Erin Guerrero  DOB: May 30, 1951  Surgery: Cataract extraction with intraocular lens implant phacoemulsification; Left Eye Surgeon: Ardeth Krabbe MD Surgery Date: 10/12/2023 Pre-Op Date: 10/02/2023  HPI: A 5 Yr. old female patient present for 4 day post op OD. Patient is doing well. She sees an arc at times in her periphery. Looking at a light, she sees a beam that goes though it at a 45 degree angle. This is constant. Difficulties recognizing people from a distance, reading road signs, seeing captions on TV, and when driving at night due to the cataract OS. This is negatively affecting the patient\r\n\'s quality of life and the patient is unable to function adequately in life with the current level of vision. Patient would like to proceed with cataract sx OS.  Medical History: Cataracts  High Blood Pressure  Review of Systems Cardiovascular High Blood Pressure, high cholesterol Respiratory COPD All recorded systems are negative except as noted above.  Social Current some day smoker   Medication Prednisolone-moxiflox-bromfen,  Ezetimibe , Irbesartan , Spiriva  Respimat, Atrovent  HFA, Prednisolone acetate, Ciprofloxacin HCl  Sx/Procedures Phaco c IOL OD - Dextenza   Drug Allergies  Diphenhydramine HCl, Morphine, Sulfa, Benzocaine (bulk), Triclosan, Latex gloves  History & Physical: Heent: cataract NECK: supple without bruits LUNGS: lungs clear to auscultation CV: regular rate and rhythm Abdomen: soft and non-tender  Impression & Plan: Assessment: 1.  CATARACT EXTRACTION STATUS; Right Eye (Z98.41) 2.  INTRAOCULAR LENS IOL ; Right Eye (Z96.1) 3.  CATARACT NUCLEAR SCLEROSIS AGE RELATED; Both Eyes (H25.13)  Plan: 1.  POD4 Doing well. All post-op precautions discussed and instructions reviewed. Written instructions given.  2.   As above  3.  Cataracts are visually significant and account for the patient's complaints. Discussed all  risks, benefits, procedures and recovery, including infection, loss of vision and eye, need for glasses after surgery or additional procedures. Patient understands changing glasses will not improve vision. Patient indicated understanding of procedure. All questions answered. Patient desires to have surgery, recommend phacoemulsification with intraocular lens. Patient to have preliminary testing necessary (Argos/IOL Master, Mac OCT, TOPO) Educational materials provided:Cataract.  Plan: - Proceed with cataract surgery OS when ready - Plan for best distance target with DIB00 - No DM, no fuchs, no prior eye surgery - good dilation - dextenza 

## 2023-10-10 NOTE — Anesthesia Postprocedure Evaluation (Signed)
 Anesthesia Post Note  Patient: Erin Guerrero  Procedure(s) Performed: PHACOEMULSIFICATION, CATARACT, WITH IOL INSERTION (Right: Eye) INSERTION, STENT, DRUG-ELUTING, LACRIMAL CANALICULUS (Right: Eye)  Patient location during evaluation: Phase II Anesthesia Type: General Level of consciousness: awake Pain management: pain level controlled Vital Signs Assessment: post-procedure vital signs reviewed and stable Respiratory status: spontaneous breathing and respiratory function stable Cardiovascular status: blood pressure returned to baseline and stable Postop Assessment: no headache and no apparent nausea or vomiting Anesthetic complications: no Comments: Late entry   No notable events documented.   Last Vitals:  Vitals:   09/28/23 0657 09/28/23 0754  BP: (!) 161/80 (!) 175/72  Pulse:  76  Resp: 18 17  Temp: 36.7 C 36.6 C  SpO2: 97% 98%    Last Pain:  Vitals:   10/01/23 1551  TempSrc:   PainSc: 0-No pain                 [name]

## 2023-10-12 ENCOUNTER — Ambulatory Visit (HOSPITAL_COMMUNITY): Admitting: Certified Registered"

## 2023-10-12 ENCOUNTER — Ambulatory Visit (HOSPITAL_COMMUNITY)
Admission: RE | Admit: 2023-10-12 | Discharge: 2023-10-12 | Disposition: A | Discharge status: Home / Self Care | Attending: Optometry | Admitting: Optometry

## 2023-10-12 ENCOUNTER — Encounter (HOSPITAL_COMMUNITY): Payer: Self-pay | Admitting: Optometry

## 2023-10-12 ENCOUNTER — Encounter (HOSPITAL_COMMUNITY)
Admission: RE | Disposition: A | Discharge status: Home / Self Care | Payer: Self-pay | Source: Home / Self Care | Attending: Optometry

## 2023-10-12 DIAGNOSIS — I10 Essential (primary) hypertension: Secondary | ICD-10-CM | POA: Diagnosis not present

## 2023-10-12 DIAGNOSIS — F17210 Nicotine dependence, cigarettes, uncomplicated: Secondary | ICD-10-CM

## 2023-10-12 DIAGNOSIS — H5712 Ocular pain, left eye: Secondary | ICD-10-CM | POA: Diagnosis not present

## 2023-10-12 DIAGNOSIS — Z9841 Cataract extraction status, right eye: Secondary | ICD-10-CM | POA: Insufficient documentation

## 2023-10-12 DIAGNOSIS — J449 Chronic obstructive pulmonary disease, unspecified: Secondary | ICD-10-CM | POA: Insufficient documentation

## 2023-10-12 DIAGNOSIS — F17200 Nicotine dependence, unspecified, uncomplicated: Secondary | ICD-10-CM | POA: Diagnosis not present

## 2023-10-12 DIAGNOSIS — H2512 Age-related nuclear cataract, left eye: Secondary | ICD-10-CM | POA: Diagnosis not present

## 2023-10-12 DIAGNOSIS — Z961 Presence of intraocular lens: Secondary | ICD-10-CM | POA: Insufficient documentation

## 2023-10-12 HISTORY — PX: INSERTION, STENT, DRUG-ELUTING, LACRIMAL CANALICULUS: SHX7453

## 2023-10-12 HISTORY — PX: CATARACT EXTRACTION W/PHACO: SHX586

## 2023-10-12 SURGERY — PHACOEMULSIFICATION, CATARACT, WITH IOL INSERTION
Anesthesia: Monitor Anesthesia Care | Site: Eye | Laterality: Left

## 2023-10-12 MED ORDER — TETRACAINE 0.5 % OP SOLN OPTIME - NO CHARGE
OPHTHALMIC | Status: DC | PRN
  Administered 2023-10-12: 1 [drp] via OPHTHALMIC

## 2023-10-12 MED ORDER — MOXIFLOXACIN HCL 5 MG/ML IO SOLN
INTRAOCULAR | Status: AC
  Filled 2023-10-12: qty 1

## 2023-10-12 MED ORDER — BSS IO SOLN
INTRAOCULAR | Status: DC | PRN
  Administered 2023-10-12: 15 mL via INTRAOCULAR

## 2023-10-12 MED ORDER — DEXAMETHASONE 0.4 MG OP INST
VAGINAL_INSERT | OPHTHALMIC | Status: AC
  Filled 2023-10-12: qty 1

## 2023-10-12 MED ORDER — PHENYLEPHRINE-KETOROLAC 1-0.3 % IO SOLN
INTRAOCULAR | Status: DC | PRN
  Administered 2023-10-12: 500 mL via OPHTHALMIC

## 2023-10-12 MED ORDER — MOXIFLOXACIN HCL 5 MG/ML IO SOLN
INTRAOCULAR | Status: DC | PRN
  Administered 2023-10-12: .2 mL via OPHTHALMIC

## 2023-10-12 MED ORDER — TROPICAMIDE 1 % OP SOLN
1.0000 [drp] | OPHTHALMIC | Status: AC | PRN
  Administered 2023-10-12 (×3): 1 [drp] via OPHTHALMIC

## 2023-10-12 MED ORDER — MIDAZOLAM HCL 2 MG/2ML IJ SOLN
INTRAMUSCULAR | Status: DC | PRN
Start: 2023-10-12 — End: 2023-10-12
  Administered 2023-10-12: 2 mg via INTRAVENOUS

## 2023-10-12 MED ORDER — PHENYLEPHRINE HCL 2.5 % OP SOLN
1.0000 [drp] | OPHTHALMIC | Status: AC | PRN
  Administered 2023-10-12 (×3): 1 [drp] via OPHTHALMIC

## 2023-10-12 MED ORDER — LIDOCAINE HCL 3.5 % OP GEL
1.0000 | Freq: Once | OPHTHALMIC | Status: AC
  Administered 2023-10-12: 1 via OPHTHALMIC

## 2023-10-12 MED ORDER — POVIDONE-IODINE 5 % OP SOLN
OPHTHALMIC | Status: DC | PRN
  Administered 2023-10-12: 1 via OPHTHALMIC

## 2023-10-12 MED ORDER — MIDAZOLAM HCL 2 MG/2ML IJ SOLN
INTRAMUSCULAR | Status: AC
  Filled 2023-10-12: qty 2

## 2023-10-12 MED ORDER — SIGHTPATH DOSE#1 NA HYALUR & NA CHOND-NA HYALUR IO KIT
PACK | INTRAOCULAR | Status: DC | PRN
  Administered 2023-10-12: 1 via OPHTHALMIC

## 2023-10-12 MED ORDER — LIDOCAINE HCL (PF) 1 % IJ SOLN
INTRAMUSCULAR | Status: DC | PRN
  Administered 2023-10-12: 2 mL

## 2023-10-12 MED ORDER — DEXAMETHASONE 0.4 MG OP INST
VAGINAL_INSERT | OPHTHALMIC | Status: DC | PRN
  Administered 2023-10-12: .4 mg via OPHTHALMIC

## 2023-10-12 MED ORDER — STERILE WATER FOR IRRIGATION IR SOLN
Status: DC | PRN
  Administered 2023-10-12: 250 mL

## 2023-10-12 MED ORDER — TETRACAINE HCL 0.5 % OP SOLN
1.0000 [drp] | OPHTHALMIC | Status: AC | PRN
  Administered 2023-10-12 (×3): 1 [drp] via OPHTHALMIC

## 2023-10-12 MED ORDER — PHENYLEPHRINE-KETOROLAC 1-0.3 % IO SOLN
INTRAOCULAR | Status: AC
  Filled 2023-10-12: qty 4

## 2023-10-12 SURGICAL SUPPLY — 14 items
CATARACT SUITE SIGHTPATH (MISCELLANEOUS) ×1 IMPLANT
CLOTH BEACON ORANGE TIMEOUT ST (SAFETY) ×1 IMPLANT
DRSG TEGADERM 4X4.75 (GAUZE/BANDAGES/DRESSINGS) ×1 IMPLANT
EYE SHIELD UNIVERSAL CLEAR (GAUZE/BANDAGES/DRESSINGS) IMPLANT
FEE CATARACT SUITE SIGHTPATH (MISCELLANEOUS) ×1 IMPLANT
GLOVE BIOGEL PI IND STRL 7.0 (GLOVE) ×2 IMPLANT
LENS IOL TECNIS EYHANCE 25.0 (Intraocular Lens) IMPLANT
NDL HYPO 18GX1.5 BLUNT FILL (NEEDLE) ×1 IMPLANT
NEEDLE HYPO 18GX1.5 BLUNT FILL (NEEDLE) ×1 IMPLANT
PAD ARMBOARD POSITIONER FOAM (MISCELLANEOUS) ×1 IMPLANT
POSITIONER HEAD 8X9X4 ADT (SOFTGOODS) ×1 IMPLANT
SYR TB 1ML LL NO SAFETY (SYRINGE) ×1 IMPLANT
TAPE SURG TRANSPORE 1 IN (GAUZE/BANDAGES/DRESSINGS) IMPLANT
WATER STERILE IRR 250ML POUR (IV SOLUTION) ×1 IMPLANT

## 2023-10-12 NOTE — Discharge Instructions (Signed)
 Please discharge patient when stable, will follow up today with Dr. Ilsa Iha at the [facility] office immediately following discharge.  Leave shield in place until visit.  All paperwork with discharge instructions will be given at the office.  [facility] Address:  [address]  [place], [place] [address]  Dr. Chaya Jan Phone: [phone]

## 2023-10-12 NOTE — Anesthesia Preprocedure Evaluation (Signed)
 Anesthesia Evaluation  Patient identified by MRN, date of birth, ID band Patient awake    Reviewed: Allergy & Precautions, H&P , NPO status , Patient's Chart, lab work & pertinent test results, reviewed documented beta blocker date and time   Airway Mallampati: II  TM Distance: >3 FB Neck ROM: full    Dental no notable dental hx.    Pulmonary neg pulmonary ROS, pneumonia, COPD, Current Smoker and Patient abstained from smoking.   Pulmonary exam normal breath sounds clear to auscultation       Cardiovascular Exercise Tolerance: Good hypertension, negative cardio ROS  Rhythm:regular Rate:Normal     Neuro/Psych  Neuromuscular disease negative neurological ROS  negative psych ROS   GI/Hepatic negative GI ROS, Neg liver ROS,,,  Endo/Other  negative endocrine ROS    Renal/GU negative Renal ROS  negative genitourinary   Musculoskeletal   Abdominal   Peds  Hematology negative hematology ROS (+)   Anesthesia Other Findings   Reproductive/Obstetrics negative OB ROS                             Anesthesia Physical Anesthesia Plan  ASA: 3  Anesthesia Plan: MAC   Post-op Pain Management:    Induction:   PONV Risk Score and Plan:   Airway Management Planned:   Additional Equipment:   Intra-op Plan:   Post-operative Plan:   Informed Consent: I have reviewed the patients History and Physical, chart, labs and discussed the procedure including the risks, benefits and alternatives for the proposed anesthesia with the patient or authorized representative who has indicated his/her understanding and acceptance.     Dental Advisory Given  Plan Discussed with: CRNA  Anesthesia Plan Comments:        Anesthesia Quick Evaluation

## 2023-10-12 NOTE — Op Note (Signed)
 Date of procedure: 10/12/23  Pre-operative diagnosis: Visually significant age-related nuclear cataract, Left Eye (H25.12)  Post-operative diagnosis: Visually significant age-related nuclear cataract, Left Eye  Procedure: Removal of cataract via phacoemulsification and insertion of intra-ocular lens J&J DIB00 +25.0D into the capsular bag of the Left Eye  Attending surgeon: Gale Jude, MD  Anesthesia: MAC, Topical Akten   Complications: None  Estimated Blood Loss: <58mL (minimal)  Specimens: None  Implants:  Implant Name Type Inv. Item Serial No. Manufacturer Lot No. LRB No. Used Action  LENS IOL TECNIS EYHANCE 25.0 - [id number] Intraocular Lens LENS IOL TECNIS EYHANCE 25.0 [id number] [employer]PATH  Left 1 Implanted    Indications:  Visually significant age-related cataract, Left Eye  Procedure:  The patient was seen and identified in the pre-operative area. The operative eye was identified and dilated.  The operative eye was marked.  Topical anesthesia was administered to the operative eye.     The patient was then to the operative suite and placed in the supine position.  A timeout was performed confirming the patient, procedure to be performed, and all other relevant information.   The patient's face was prepped and draped in the usual fashion for intra-ocular surgery.  A lid speculum was placed into the operative eye and the surgical microscope moved into place and focused.  An inferotemporal paracentesis was created using a 20 gauge paracentesis blade.  BSS mixed with Omidria , followed by 1% lidocaine  was injected into the anterior chamber.  Viscoelastic was injected into the anterior chamber.  A temporal clear-corneal main wound incision was created using a 2.1mm microkeratome.  A continuous curvilinear capsulorrhexis was initiated using an irrigating cystitome and completed using capsulorrhexis forceps.  Hydrodissection and hydrodeliniation were performed.  Viscoelastic was  injected into the anterior chamber.  A phacoemulsification handpiece and a chopper as a second instrument were used to remove the nucleus and epinucleus. The irrigation/aspiration handpiece was used to remove any remaining cortical material.   The capsular bag was reinflated with viscoelastic, checked, and found to be intact.  The intraocular lens was inserted into the capsular bag.  The irrigation/aspiration handpiece was used to remove any remaining viscoelastic.  The clear corneal wound and paracentesis wounds were then hydrated and checked with Weck-Cels to be watertight. Moxifloxacin  was instilled into the anterior chamber.  The lid-speculum and drape were removed. The lower punctum was dilated, and the dextenza  implant was inserted into it. The patient's face was cleaned with a wet and dry 4x4.  A clear shield was taped over the eye. The patient was taken to the post-operative care unit in good condition, having tolerated the procedure well.  Post-Op Instructions: The patient will follow up at [facility] for a same day post-operative evaluation and will receive all other orders and instructions.

## 2023-10-12 NOTE — Transfer of Care (Addendum)
 Immediate Anesthesia Transfer of Care Note  Patient: Erin Guerrero  Procedure(s) Performed: PHACOEMULSIFICATION, CATARACT, WITH IOL INSERTION (Left: Eye) INSERTION, STENT, DRUG-ELUTING, LACRIMAL CANALICULUS (Left: Eye)  Patient Location: PACU  Anesthesia Type:MAC  Level of Consciousness: awake, alert , oriented, and patient cooperative  Airway & Oxygen  Therapy: Patient Spontanous Breathing  Post-op Assessment: Report given to RN, Post -op Vital signs reviewed and stable, and Patient moving all extremities X 4  Post vital signs: Reviewed and stable  Last Vitals:  Vitals Value Taken Time  BP 154/92 10/12/23 1244  Temp 36.7 C 10/12/23 1244  Pulse 68 10/12/23 1244  Resp 12 10/12/23 1244  SpO2 99 % 10/12/23 1244    Last Pain:  Vitals:   10/12/23 1244  TempSrc: Oral  PainSc:          Complications: No notable events documented.

## 2023-10-12 NOTE — Anesthesia Postprocedure Evaluation (Signed)
 Anesthesia Post Note  Patient: Erin Guerrero  Procedure(s) Performed: PHACOEMULSIFICATION, CATARACT, WITH IOL INSERTION (Left: Eye) INSERTION, STENT, DRUG-ELUTING, LACRIMAL CANALICULUS (Left: Eye)  Patient location during evaluation: Phase II Anesthesia Type: MAC Level of consciousness: awake Pain management: pain level controlled Vital Signs Assessment: post-procedure vital signs reviewed and stable Respiratory status: spontaneous breathing and respiratory function stable Cardiovascular status: blood pressure returned to baseline and stable Postop Assessment: no headache and no apparent nausea or vomiting Anesthetic complications: no Comments: Late entry   No notable events documented.   Last Vitals:  Vitals:   10/12/23 1118 10/12/23 1244  BP: (!) 169/62 (!) 154/92  Pulse:  68  Resp: 12 12  Temp: 36.5 C 36.7 C  SpO2: 100% 99%    Last Pain:  Vitals:   10/12/23 1244  TempSrc: Oral  PainSc:                  [name]

## 2023-10-12 NOTE — Interval H&P Note (Signed)
 History and Physical Interval Note:  10/12/2023 11:42 AM  The H and P was reviewed and updated. The patient was examined.  No changes were found after exam.  The surgical eye was marked.  Clydell Sposito

## 2023-10-15 ENCOUNTER — Encounter (HOSPITAL_COMMUNITY): Payer: Self-pay | Admitting: Optometry

## 2023-12-01 ENCOUNTER — Other Ambulatory Visit: Payer: Self-pay | Admitting: Family Medicine

## 2023-12-03 ENCOUNTER — Other Ambulatory Visit: Payer: Self-pay | Admitting: Family Medicine

## 2023-12-20 ENCOUNTER — Ambulatory Visit

## 2023-12-24 ENCOUNTER — Ambulatory Visit

## 2023-12-24 VITALS — BP 136/74 | HR 76 | Ht 63.0 in | Wt 164.0 lb

## 2023-12-24 DIAGNOSIS — Z122 Encounter for screening for malignant neoplasm of respiratory organs: Secondary | ICD-10-CM

## 2023-12-24 DIAGNOSIS — Z0000 Encounter for general adult medical examination without abnormal findings: Secondary | ICD-10-CM | POA: Diagnosis not present

## 2023-12-24 NOTE — Patient Instructions (Signed)
 Ms. Erin Guerrero , Thank you for taking time out of your busy schedule to complete your Annual Wellness Visit with me. I enjoyed our conversation and look forward to speaking with you again next year. I, as well as your care team,  appreciate your ongoing commitment to your health goals. Please review the following plan we discussed and let me know if I can assist you in the future. Your Game plan/ To Do List    Follow up Visits: Next Medicare AWV with our clinical staff: 12/24/24 at 1:10p.m.   Next Office Visit with your provider: 04/03/24 at 9:55a.m.  Clinician Recommendations:  Aim for 30 minutes of exercise or brisk walking, 6-8 glasses of water , and 5 servings of fruits and vegetables each day. Patient is aware and encouraged to get it done at her next office visit with provider: Shingles vaccine and Lung cancer screening done-ordered placed and pt is aware. Will wait for call to schedule appt.       This is a list of the screening recommended for you and due dates:  Health Maintenance  Topic Date Due   Zoster (Shingles) Vaccine (1 of 2) Never done   Screening for Lung Cancer  08/01/2017   Medicare Annual Wellness Visit  10/02/2023   COVID-19 Vaccine (5 - 2024-25 season) 01/08/2025*   Flu Shot  01/18/2024   DEXA scan (bone density measurement)  04/05/2025   Mammogram  05/13/2025   DTaP/Tdap/Td vaccine (2 - Td or Tdap) 01/05/2028   Colon Cancer Screening  06/05/2033   Pneumococcal Vaccine for age over 1  Completed   Hepatitis C Screening  Completed   Hepatitis B Vaccine  Aged Out   HPV Vaccine  Aged Out   Meningitis B Vaccine  Aged Out  *Topic was postponed. The date shown is not the original due date.    Advanced directives: (Declined) Advance directive discussed with you today. Even though you declined this today, please call our office should you change your mind, and we can give you the proper paperwork for you to fill out. Advance Care Planning is important because it:  [x]  Makes  sure you receive the medical care that is consistent with your values, goals, and preferences  [x]  It provides guidance to your family and loved ones and reduces their decisional burden about whether or not they are making the right decisions based on your wishes.  Follow the link provided in your after visit summary or read over the paperwork we have mailed to you to help you started getting your Advance Directives in place. If you need assistance in completing these, please reach out to us  so that we can help you!  See attachments for Preventive Care and Fall Prevention Tips.

## 2023-12-24 NOTE — Progress Notes (Signed)
 Subjective:   Erin Guerrero is a 73 y.o. who presents for a Medicare Wellness preventive visit.  As a reminder, Annual Wellness Visits don't include a physical exam, and some assessments may be limited, especially if this visit is performed virtually. We may recommend an in-person follow-up visit with your provider if needed.  Visit Complete: Virtual I connected with  Karna CHRISTELLA Kettering on 12/24/23 by a audio enabled telemedicine application and verified that I am speaking with the correct person using two identifiers.  Patient Location: Home  Provider Location: Home Office  I discussed the limitations of evaluation and management by telemedicine. The patient expressed understanding and agreed to proceed.  Vital Signs: Because this visit was a virtual/telehealth visit, some criteria may be missing or patient reported. Any vitals not documented were not able to be obtained and vitals that have been documented are patient reported.  VideoDeclined- This patient declined Librarian, academic. Therefore the visit was completed with audio only.  Persons Participating in Visit: Patient.  AWV Questionnaire: No: Patient Medicare AWV questionnaire was not completed prior to this visit.  Cardiac Risk Factors include: advanced age (>11men, >61 women);dyslipidemia;hypertension;sedentary lifestyle;smoking/ tobacco exposure     Objective:    Today's Vitals   12/24/23 1522  BP: 136/74  Pulse: 76  Weight: 164 lb (74.4 kg)  Height: 5' 3 (1.6 m)   Body mass index is 29.05 kg/m.     12/24/2023    3:13 PM 10/09/2023   10:13 AM 06/06/2023   10:11 AM 06/04/2023   10:42 AM 10/02/2022    3:24 PM 01/31/2021   10:41 AM 09/03/2016   10:25 AM  Advanced Directives  Does Patient Have a Medical Advance Directive? No No No No No No No   Would patient like information on creating a medical advance directive?  No - Patient declined No - Patient declined No - Patient declined No  - Patient declined No - Patient declined      Data saved with a previous flowsheet row definition    Current Medications (verified) Outpatient Encounter Medications as of 12/24/2023  Medication Sig   ascorbic acid (VITAMIN C) 500 MG tablet Take 500 mg by mouth daily.   aspirin  EC 81 MG tablet Take 81 mg by mouth daily.   Calcium  Carbonate-Vit D-Min (CALCIUM  1200 PO) Take 1,200 mg by mouth daily.   cetirizine (ZYRTEC) 10 MG tablet Take 10 mg by mouth daily as needed.    cholecalciferol  (VITAMIN D ) 1000 units tablet Take 2,000 Units by mouth daily.   clobetasol  ointment (TEMOVATE ) 0.05 % APPLY EXTERNALLY TO THE AFFECTED AREA TWICE DAILY   diazepam  (VALIUM ) 5 MG tablet One every 6 hours for muscle spasm   ezetimibe  (ZETIA ) 10 MG tablet TAKE 1 TABLET BY MOUTH DAILY   ibuprofen  (ADVIL ,MOTRIN ) 800 MG tablet Take 800 mg by mouth every 8 (eight) hours as needed.   ipratropium (ATROVENT  HFA) 17 MCG/ACT inhaler Inhale 2 puffs into the lungs every 6 (six) hours as needed for wheezing.   irbesartan  (AVAPRO ) 150 MG tablet TAKE 1 TABLET BY MOUTH DAILY   Tiotropium Bromide  Monohydrate (SPIRIVA  RESPIMAT) 2.5 MCG/ACT AERS INHALE 2 PUFFS INTO THE LUNGS ONCE DAILY.   traMADol  (ULTRAM ) 50 MG tablet Take 1 tablet (50 mg total) by mouth 3 (three) times daily as needed.   triamcinolone (NASACORT) 55 MCG/ACT AERO nasal inhaler Place 2 sprays into the nose daily as needed (allergies).    TURMERIC CURCUMIN PO Take 1 tablet  by mouth daily.   No facility-administered encounter medications on file as of 12/24/2023.    Allergies (verified) Diphenhydramine hcl, Morphine, Statins, Red yeast rice [cholestin], Triclosan, Other, Benzocaine, Latex, and Sulfa antibiotics   History: Past Medical History:  Diagnosis Date   Acute respiratory failure (HCC)    Allergy    CAP (community acquired pneumonia)    Cataract    Chronic bronchitis (HCC)    COPD (chronic obstructive pulmonary disease) (HCC)    HTN (hypertension)     Hyperlipidemia    Influenza    Osteoarthritis    Pulmonary nodule    Seasonal allergies    Tobacco use    Past Surgical History:  Procedure Laterality Date   BREAST BIOPSY  2014   Benign   CATARACT EXTRACTION W/PHACO Right 09/28/2023   Procedure: PHACOEMULSIFICATION, CATARACT, WITH IOL INSERTION;  Surgeon: Juli Blunt, MD;  Location: [facility];  Service: Ophthalmology;  Laterality: Right;  CDE: 5.48   CATARACT EXTRACTION W/PHACO Left 10/12/2023   Procedure: PHACOEMULSIFICATION, CATARACT, WITH IOL INSERTION;  Surgeon: Juli Blunt, MD;  Location: [facility];  Service: Ophthalmology;  Laterality: Left;  CDE: 4.24   Cervical cryo-treatment  1972   CHOLECYSTECTOMY  1998   COLONOSCOPY     Polyps   COLONOSCOPY N/A 03/16/2016   Procedure: COLONOSCOPY;  Surgeon: Lamar CHRISTELLA Hollingshead, MD;  [facility]: [facility];  Service: Endoscopy;  Laterality: N/A;  1:30 PM   COLONOSCOPY WITH PROPOFOL  N/A 06/06/2023   Procedure: COLONOSCOPY WITH PROPOFOL ;  Surgeon: Hollingshead Lamar CHRISTELLA, MD;  [facility]: [facility];  Service: Endoscopy;  Laterality: N/A;  11:30am, asa 3   DILATION AND CURETTAGE OF UTERUS     EYE SURGERY     GANGLION CYST EXCISION  1970   Left wrist   GANGLION CYST EXCISION     Right hand and left ring finger   INSERTION, STENT, DRUG-ELUTING, LACRIMAL CANALICULUS Right 09/28/2023   Procedure: INSERTION, STENT, DRUG-ELUTING, LACRIMAL CANALICULUS;  Surgeon: Juli Blunt, MD;  Location: [facility];  Service: Ophthalmology;  Laterality: Right;   INSERTION, STENT, DRUG-ELUTING, LACRIMAL CANALICULUS Left 10/12/2023   Procedure: INSERTION, STENT, DRUG-ELUTING, LACRIMAL CANALICULUS;  Surgeon: Juli Blunt, MD;  Location: [facility];  Service: Ophthalmology;  Laterality: Left;   LUMBAR LAMINECTOMY  1985, 1987   MENISCECTOMY  2008   POLYPECTOMY  03/16/2016   Procedure: POLYPECTOMY;  Surgeon: Lamar CHRISTELLA Hollingshead, MD;  [facility];  Service: Endoscopy;;  colon   ROTATOR CUFF REPAIR  2004    Right   SPINE SURGERY     TUBAL LIGATION     Family History  Problem Relation Age of Onset   Cancer Mother 62       Question cervical/ovarian   Arthritis Mother    Kidney failure Sister 47       born with one kidney, other kidney injured by mva   Kidney failure Sister    Lupus Sister    Diabetes Sister    Arthritis Brother    Hearing loss Brother    Scoliosis Brother    Alcohol abuse Brother    Thyroid  cancer Daughter    Celiac disease Neg Hx    Inflammatory bowel disease Neg Hx    Cancer - Colon Neg Hx    Social History   Socioeconomic History   Marital status: Widowed    Spouse name: Not on file   Number of children: 1   Years of education: Not on file   Highest education level:  Master's degree (e.g., MA, MS, MEng, MEd, MSW, MBA)  Occupational History   Occupation: PT for [employer], OR/ER nurse  Tobacco Use   Smoking status: Every Day    Current packs/day: 1.00    Average packs/day: 0.6 packs/day for 100.6 years (63.1 ttl pk-yrs)    Types: Cigarettes    Start date: 06/03/1973   Smokeless tobacco: Never   Tobacco comments:    Smoker of 1 ppd x 50 years - quit 03/2017 - started back light smoker currently 4-5 per day  Vaping Use   Vaping status: Never Used  Substance and Sexual Activity   Alcohol use: No   Drug use: No   Sexual activity: Not Currently    Birth control/protection: Post-menopausal  Other Topics Concern   Not on file  Social History Narrative   Lives alone.   Daughter lives 20 miles away in [place].   Social Drivers of Corporate investment banker Strain: Low Risk  (12/24/2023)   Overall Financial Resource Strain (CARDIA)    Difficulty of Paying Living Expenses: Not hard at all  Food Insecurity: No Food Insecurity (12/24/2023)   Hunger Vital Sign    Worried About Running Out of Food in the Last Year: Never true    Ran Out of Food in the Last Year: Never true  Transportation Needs: No Transportation Needs (12/24/2023)   PRAPARE - Therapist, art (Medical): No    Lack of Transportation (Non-Medical): No  Physical Activity: Sufficiently Active (12/24/2023)   Exercise Vital Sign    Days of Exercise per Week: 7 days    Minutes of Exercise per Session: 50 min  Stress: No Stress Concern Present (12/24/2023)   [employer] of Occupational Health - Occupational Stress Questionnaire    Feeling of Stress: Not at all  Social Connections: Socially Isolated (12/24/2023)   Social Connection and Isolation Panel    Frequency of Communication with Friends and Family: More than three times a week    Frequency of Social Gatherings with Friends and Family: Twice a week    Attends Religious Services: Never    Database administrator or Organizations: No    Attends Banker Meetings: Never    Marital Status: Widowed    Tobacco Counseling Ready to quit: Not Answered Counseling given: Yes Tobacco comments: Smoker of 1 ppd x 50 years - quit 03/2017 - started back light smoker currently 4-5 per day    Clinical Intake:  Pre-visit preparation completed: Yes  Pain : No/denies pain     BMI - recorded: 29.05 Nutritional Status: BMI 25 -29 Overweight Nutritional Risks: None Diabetes: No  No results found for: HGBA1C   How often do you need to have someone help you when you read instructions, pamphlets, or other written materials from your doctor or pharmacy?: 1 - Never  Interpreter Needed?: No  Information entered by :: alia t/cma   Activities of Daily Living     12/24/2023    3:12 PM 12/16/2023    1:40 PM  In your present state of health, do you have any difficulty performing the following activities:  Hearing? 0 0  Vision? 0 0  Difficulty concentrating or making decisions? 0 0  Walking or climbing stairs? 0 0  Dressing or bathing? 0 0  Doing errands, shopping? 0 0  Preparing Food and eating ? N N  Using the Toilet? N N  In the past six months, have you accidently leaked urine?  N N  Do you have  problems with loss of bowel control? N N  Managing your Medications? N N  Managing your Finances? N N  Housekeeping or managing your Housekeeping? N N    Patient Care Team: Dettinger, Fonda LABOR, MD as PCP - General (Family Medicine) Shaaron Lamar HERO, MD as Consulting Physician (Gastroenterology) Billee Mliss BIRCH, [facility] as [facility] Management (Pharmacist) Dr Willma Mo[facility], OD (Optometry)  I have updated your Care Teams any recent Medical Services you may have received from other providers in the past year.     Assessment:   This is a routine wellness examination for [place].  Hearing/Vision screen Hearing Screening - Comments:: Pt denies hearing dif Vision Screening - Comments:: Pt denies vision dif/pt goes [facility] Dr. Eller ov a month ago   Goals Addressed             This Visit's Progress    Quit Smoking   Not on track    Remain active and independent   On track      Depression Screen     12/24/2023    3:16 PM 04/02/2023    1:22 PM 10/18/2022    4:06 PM 10/02/2022    3:23 PM 01/23/2022   10:29 AM 01/31/2021   10:36 AM 01/02/2020   10:25 AM  PHQ 2/9 Scores  PHQ - 2 Score 0 0 0 0 0 0 0  PHQ- 9 Score   0        Fall Risk     12/24/2023    3:11 PM 12/16/2023    1:40 PM 04/02/2023    1:22 PM 10/18/2022    4:06 PM 10/02/2022    3:24 PM  Fall Risk   Falls in the past year? 0 0 0 0 0  Number falls in past yr: 0    0  Injury with Fall? 0    0  Risk for fall due to : No Fall Risks    No Fall Risks  Follow up Falls evaluation completed    Falls prevention discussed;Education provided;Falls evaluation completed    MEDICARE RISK AT HOME:  Medicare Risk at Home Any stairs in or around the home?: Yes If so, are there any without handrails?: Yes Home free of loose throw rugs in walkways, pet beds, electrical cords, etc?: Yes Adequate lighting in your home to reduce risk of falls?: Yes Life alert?: No Use of a cane, walker or  w/c?: No Grab bars in the bathroom?: No Shower chair or bench in shower?: Yes Elevated toilet seat or a handicapped toilet?: Yes  TIMED UP AND GO:  Was the test performed?  no  Cognitive Function: 6CIT completed        12/24/2023    3:17 PM 10/02/2022    3:24 PM 01/31/2021   10:39 AM  6CIT Screen  What Year? 0 points 0 points 0 points  What month? 0 points 0 points 0 points  What time? 0 points 0 points 0 points  Count back from 20 0 points 0 points 0 points  Months in reverse 0 points 0 points 0 points  Repeat phrase 0 points 0 points 0 points  Total Score 0 points 0 points 0 points    Immunizations Immunization History  Administered Date(s) Administered   Influenza Whole 03/19/2016   Influenza, High Dose Seasonal PF 04/19/2017, 03/18/2023   Influenza,inj,Quad PF,6+ Mos 03/16/2022   PFIZER(Purple Top)SARS-COV-2 Vaccination 08/09/2019, 09/13/2019, 04/09/2020  PNEUMOCOCCAL CONJUGATE-20 01/23/2022   Pfizer Covid-19 Vaccine Bivalent Booster 57yrs & up 04/18/2021   Pneumococcal Conjugate-13 12/31/2017   Pneumococcal-Unspecified 07/20/2016   Tdap 01/04/2018    Screening Tests Health Maintenance  Topic Date Due   Zoster Vaccines- Shingrix (1 of 2) Never done   Lung Cancer Screening  08/01/2017   COVID-19 Vaccine (5 - 2024-25 season) 01/08/2025 (Originally 02/18/2023)   INFLUENZA VACCINE  01/18/2024   Medicare Annual Wellness (AWV)  12/23/2024   DEXA SCAN  04/05/2025   MAMMOGRAM  05/13/2025   DTaP/Tdap/Td (2 - Td or Tdap) 01/05/2028   Colonoscopy  06/05/2033   Pneumococcal Vaccine: 50+ Years  Completed   Hepatitis C Screening  Completed   Hepatitis B Vaccines  Aged Out   HPV VACCINES  Aged Out   Meningococcal B Vaccine  Aged Out    Health Maintenance  Health Maintenance Due  Topic Date Due   Zoster Vaccines- Shingrix (1 of 2) Never done   Lung Cancer Screening  08/01/2017   Health Maintenance Items Addressed: See Nurse Notes at the end of this  note  Additional Screening:  Vision Screening: Recommended annual ophthalmology exams for early detection of glaucoma and other disorders of the eye. Would you like a referral to an eye doctor? No    Dental Screening: Recommended annual dental exams for proper oral hygiene  Community Resource Referral / Chronic Care Management: CRR required this visit?  No   CCM required this visit?  No   Plan:    I have personally reviewed and noted the following in the patient's chart:   Medical and social history Use of alcohol, tobacco or illicit drugs  Current medications and supplements including opioid prescriptions. Patient is not currently taking opioid prescriptions. Functional ability and status Nutritional status Physical activity Advanced directives List of other physicians Hospitalizations, surgeries, and ER visits in previous 12 months Vitals Screenings to include cognitive, depression, and falls Referrals and appointments  In addition, I have reviewed and discussed with patient certain preventive protocols, quality metrics, and best practice recommendations. A written personalized care plan for preventive services as well as general preventive health recommendations were provided to patient.   Ozie Ned, CMA   12/24/2023   After Visit Summary: (MyChart) Due to this being a telephonic visit, the after visit summary with patients personalized plan was offered to patient via MyChart   Notes: Patient is aware and encouraged to get it done at her next office visit with provider: Shingles vaccine and Lung cancer screening done-ordered placed and pt is aware. Will wait for call to schedule appt.

## 2024-01-09 ENCOUNTER — Telehealth: Payer: Self-pay | Admitting: Acute Care

## 2024-01-09 DIAGNOSIS — Z87891 Personal history of nicotine dependence: Secondary | ICD-10-CM

## 2024-01-09 DIAGNOSIS — Z122 Encounter for screening for malignant neoplasm of respiratory organs: Secondary | ICD-10-CM

## 2024-01-09 DIAGNOSIS — F17210 Nicotine dependence, cigarettes, uncomplicated: Secondary | ICD-10-CM

## 2024-01-09 NOTE — Telephone Encounter (Signed)
 Lung Cancer Screening Narrative/Criteria Questionnaire (Cigarette Smokers Only- No Cigars/Pipes/vapes)   Erin Guerrero   SDMV:01/11/2024 11:00 [name]     15-Feb-1951   LDCT: 01/22/2024 12:30 [facility]    73 y.o.   Phone: [phone]  Lung Screening Narrative (confirm age 40-77 yrs Medicare / 50-80 yrs Private pay insurance)   Insurance information:UHC mcr   Referring Provider:Dr. Museum/gallery exhibitions officer   This screening involves an initial phone call with a team member from our program. It is called a shared decision making visit. The initial meeting is required by  insurance and Medicare to make sure you understand the program. This appointment takes about 15-20 minutes to complete. You will complete the screening scan at your scheduled date/time.  This scan takes about 5-10 minutes to complete. You can eat and drink normally before and after the scan.  Criteria questions for Lung Cancer Screening:   Are you a current or former smoker? Current Age began smoking: 73yo   If you are a former smoker, what year did you quit smoking? N/A(within 15 yrs)   To calculate your smoking history, I need an accurate estimate of how many packs of cigarettes you smoked per day and for how many years. (Not just the number of PPD you are now smoking)   Years smoking 56 x Packs per day 3/4 = Pack years 42   (at least 20 pack yrs)   (Make sure they understand that we need to know how much they have smoked in the past, not just the number of PPD they are smoking now)  Do you have a personal history of cancer?  No    Do you have a family history of cancer? Yes  (cancer type and and relative) MOther - ovarian   Are you coughing up blood?  No  Have you had unexplained weight loss of 15 lbs or more in the last 6 months? No  It looks like you meet all criteria.  When would be a good time for us  to schedule you for this screening?   Additional information: N/A

## 2024-01-10 ENCOUNTER — Encounter: Payer: Self-pay | Admitting: Adult Health

## 2024-01-10 NOTE — Progress Notes (Unsigned)
  Virtual Visit via Telephone Note  I connected with DALONDA SIMONI , 01/10/24 11:30 AM by a telemedicine application and verified that I am speaking with the correct person using two identifiers.  Location: Patient: home Provider: home   I discussed the limitations of evaluation and management by telemedicine and the availability of in person appointments. The patient expressed understanding and agreed to proceed.   Shared Decision Making Visit Lung Cancer Screening Program [phone])   Eligibility: 73 y.o. Pack Years Smoking History Calculation = 42 pack years  (# packs/per year x # years smoked) Recent History of coughing up blood  no Unexplained weight loss? no ( >Than 15 pounds within the last 6 months ) Prior History Lung / other cancer no (Diagnosis within the last 5 years already requiring surveillance chest CT Scans). Smoking Status Current Smoker   Visit Components: Discussion included one or more decision making aids. YES Discussion included risk/benefits of screening. YES Discussion included potential follow up diagnostic testing for abnormal scans. YES Discussion included meaning and risk of over diagnosis. YES Discussion included meaning and risk of False Positives. YES Discussion included meaning of total radiation exposure. YES  Counseling Included: Importance of adherence to annual lung cancer LDCT screening. YES Impact of comorbidities on ability to participate in the program. YES Ability and willingness to under diagnostic treatment. YES  Smoking Cessation Counseling: Current Smokers:  Discussed importance of smoking cessation. {YES [id number] Information about tobacco cessation classes and interventions provided to patient. {YES [id number] Patient provided with ticket for LDCT Scan. {YES NO:22349} Symptomatic Patient. NO Diagnosis Code: Tobacco Use Z72.0 Asymptomatic Patient {YES NO:22349}  Counseling - 4 minutes of smoking cessation  counseling Former Smokers:  Discussed the importance of maintaining cigarette abstinence. {YES NO:22349} Diagnosis Code: Personal History of Nicotine Dependence. S12.108 Information about tobacco cessation classes and interventions provided to patient. {Responses; yes/no/refused:32142} Patient provided with ticket for LDCT Scan. {YES [id number] Written Order for Lung Cancer Screening with LDCT placed in Epic. {Smoking cessesion custom:[id number]} (CT Chest Lung Cancer Screening Low Dose W/O CM) [id number]  Z12.2-Screening of respiratory organs Z87.891-Personal history of nicotine dependence   Lamarr Myers 01/10/24

## 2024-01-10 NOTE — Patient Instructions (Signed)

## 2024-01-11 ENCOUNTER — Ambulatory Visit: Admitting: Adult Health

## 2024-01-11 DIAGNOSIS — F17210 Nicotine dependence, cigarettes, uncomplicated: Secondary | ICD-10-CM

## 2024-01-16 DIAGNOSIS — H26493 Other secondary cataract, bilateral: Secondary | ICD-10-CM | POA: Diagnosis not present

## 2024-01-22 ENCOUNTER — Ambulatory Visit (HOSPITAL_COMMUNITY)
Admission: RE | Admit: 2024-01-22 | Discharge: 2024-01-22 | Disposition: A | Discharge status: Home / Self Care | Source: Ambulatory Visit | Attending: Acute Care | Admitting: Acute Care

## 2024-01-22 DIAGNOSIS — F17210 Nicotine dependence, cigarettes, uncomplicated: Secondary | ICD-10-CM | POA: Diagnosis not present

## 2024-01-22 DIAGNOSIS — Z122 Encounter for screening for malignant neoplasm of respiratory organs: Secondary | ICD-10-CM | POA: Diagnosis not present

## 2024-01-22 DIAGNOSIS — Z87891 Personal history of nicotine dependence: Secondary | ICD-10-CM | POA: Insufficient documentation

## 2024-02-04 ENCOUNTER — Other Ambulatory Visit: Payer: Self-pay

## 2024-02-04 DIAGNOSIS — Z87891 Personal history of nicotine dependence: Secondary | ICD-10-CM

## 2024-02-04 DIAGNOSIS — Z122 Encounter for screening for malignant neoplasm of respiratory organs: Secondary | ICD-10-CM

## 2024-02-04 DIAGNOSIS — F17210 Nicotine dependence, cigarettes, uncomplicated: Secondary | ICD-10-CM

## 2024-04-03 ENCOUNTER — Ambulatory Visit: Payer: Medicare Other | Admitting: Family Medicine

## 2024-04-03 ENCOUNTER — Encounter: Payer: Self-pay | Admitting: Family Medicine

## 2024-04-03 VITALS — BP 165/82 | HR 82 | Ht 63.0 in | Wt 155.0 lb

## 2024-04-03 DIAGNOSIS — J449 Chronic obstructive pulmonary disease, unspecified: Secondary | ICD-10-CM

## 2024-04-03 DIAGNOSIS — I10 Essential (primary) hypertension: Secondary | ICD-10-CM | POA: Diagnosis not present

## 2024-04-03 DIAGNOSIS — E7800 Pure hypercholesterolemia, unspecified: Secondary | ICD-10-CM

## 2024-04-03 DIAGNOSIS — F419 Anxiety disorder, unspecified: Secondary | ICD-10-CM | POA: Diagnosis not present

## 2024-04-03 LAB — CBC WITH DIFFERENTIAL/PLATELET
Basophils Absolute: 0 x10E3/uL (ref 0.0–0.2)
Basos: 1 %
EOS (ABSOLUTE): 0.2 x10E3/uL (ref 0.0–0.4)
Eos: 3 %
Hematocrit: 44.6 % (ref 34.0–46.6)
Hemoglobin: 14.9 g/dL (ref 11.1–15.9)
Immature Grans (Abs): 0 x10E3/uL (ref 0.0–0.1)
Immature Granulocytes: 0 %
Lymphocytes Absolute: 1.9 x10E3/uL (ref 0.7–3.1)
Lymphs: 25 %
MCH: 31.6 pg (ref 26.6–33.0)
MCHC: 33.4 g/dL (ref 31.5–35.7)
MCV: 95 fL (ref 79–97)
Monocytes Absolute: 0.8 x10E3/uL (ref 0.1–0.9)
Monocytes: 10 %
Neutrophils Absolute: 4.8 x10E3/uL (ref 1.4–7.0)
Neutrophils: 61 %
Platelets: 186 x10E3/uL (ref 150–450)
RBC: 4.72 x10E6/uL (ref 3.77–5.28)
RDW: 12 % (ref 11.7–15.4)
WBC: 7.8 x10E3/uL (ref 3.4–10.8)

## 2024-04-03 LAB — LIPID PANEL
Chol/HDL Ratio: 4.3 ratio (ref 0.0–4.4)
Cholesterol, Total: 229 mg/dL — ABNORMAL HIGH (ref 100–199)
HDL: 53 mg/dL (ref >39)
LDL Chol Calc (NIH): 152 mg/dL — ABNORMAL HIGH (ref 0–99)
Triglycerides: 134 mg/dL (ref 0–149)
VLDL Cholesterol Cal: 24 mg/dL (ref 5–40)

## 2024-04-03 LAB — CMP14+EGFR
ALT: 12 IU/L (ref 0–32)
AST: 13 IU/L (ref 0–40)
Albumin: 4.4 g/dL (ref 3.8–4.8)
Alkaline Phosphatase: 88 IU/L (ref 49–135)
BUN/Creatinine Ratio: 19 (ref 12–28)
BUN: 13 mg/dL (ref 8–27)
Bilirubin Total: 0.4 mg/dL (ref 0.0–1.2)
CO2: 23 mmol/L (ref 20–29)
Calcium: 10 mg/dL (ref 8.7–10.3)
Chloride: 104 mmol/L (ref 96–106)
Creatinine, Ser: 0.68 mg/dL (ref 0.57–1.00)
Globulin, Total: 2 g/dL (ref 1.5–4.5)
Glucose: 71 mg/dL (ref 70–99)
Potassium: 4.5 mmol/L (ref 3.5–5.2)
Sodium: 143 mmol/L (ref 134–144)
Total Protein: 6.4 g/dL (ref 6.0–8.5)
eGFR: 91 mL/min/1.73 (ref >59)

## 2024-04-03 MED ORDER — DIAZEPAM 5 MG PO TABS
ORAL_TABLET | ORAL | 1 refills | Status: AC
Start: 2024-04-03 — End: ?

## 2024-04-03 NOTE — Progress Notes (Signed)
 BP (!) 165/82   Pulse 82   Ht 5' 3 (1.6 m)   Wt 155 lb (70.3 kg)   SpO2 96%   BMI 27.46 kg/m    Subjective:   Patient ID: Erin Guerrero, female    DOB: Feb 15, 1951, 73 y.o.   MRN: [id number]  HPI: Erin Guerrero is a 73 y.o. female presenting on 04/03/2024 for Medical Management of Chronic Issues (CPE), Hypertension, Hyperlipidemia, and Back Pain   Discussed the use of AI scribe software for clinical note transcription with the patient, who gave verbal consent to proceed.  History of Present Illness   Erin Guerrero is a 73 year old female with hypertension, hyperlipidemia, and COPD who presents for a recheck of her chronic medical issues.  Anxiety symptoms - Intermittent anxiety managed with diazepam  5 mg as needed - Last diazepam  refill in 2022, indicating infrequent use - Reserves medication use for when non-pharmacologic methods are insufficient - A few tablets of diazepam  remain  Chronic obstructive pulmonary disease (copd) symptoms and management - Uses Atrovent  once or twice daily, frequency dependent on memory and activity level - Atrovent  is expensive - Uses Spiriva  daily - Remains active outdoors, performing yard work with assistance for heavy tasks - History of CT scan for pulmonary evaluation  Muscle cramps - Previously experienced muscle cramps in feet and hands while on Ethervent and statins - Resumed Ethervent without recurrence of cramps - Considering increasing potassium intake to manage cramps  Blood pressure monitoring - Home blood pressure this morning was 133/80 mmHg - Office blood pressure higher than at home - Monitors blood pressure regularly at home - Blood pressure usually well-controlled  Visual disturbances post-cataract surgery - Underwent cataract surgery in April - Occasionally perceives the edges of intraocular lenses, with improvement over the last few weeks  Cervical spine dysfunction and mobility limitations - History of  cervical spine issues affecting lower extremities - Limited ability to walk long distances - Performs chair and neck exercises to maintain mobility  Hyperlipidemia management - Takes Zetia  without issues - Previously discontinued statins due to muscle cramps  Fall risk and history - History of falls, including one caused by tripping over a dog - Dog that caused fall is now deceased  Lifestyle modifications - Reduced red meat intake - Exercises almost daily, focusing on non-strenuous activities          Relevant past medical, surgical, family and social history reviewed and updated as indicated. Interim medical history since our last visit reviewed. Allergies and medications reviewed and updated.  Review of Systems  Constitutional:  Negative for chills and fever.  HENT:  Negative for ear discharge and ear pain.   Eyes:  Negative for photophobia and visual disturbance.  Respiratory:  Negative for chest tightness and shortness of breath.   Cardiovascular:  Negative for chest pain and leg swelling.  Genitourinary:  Negative for difficulty urinating and dysuria.  Musculoskeletal:  Positive for arthralgias and myalgias. Negative for back pain and gait problem.  Skin:  Negative for rash.  Neurological:  Negative for dizziness, light-headedness and headaches.  Psychiatric/Behavioral:  Negative for agitation and behavioral problems.   All other systems reviewed and are negative.   Per HPI unless specifically indicated above   Allergies as of 04/03/2024       Reactions   Diphenhydramine Hcl Anaphylaxis   Morphine Rash   Other   Statins Other (See Comments)   Muscle cramps/spasms, Severe skin rash    Red  Yeast Rice [cholestin] Rash   Triclosan Rash   Other    Bleach - contact dermatitis   Benzocaine Rash   Latex Rash   Sulfa Antibiotics Rash   Muscle cramps.        Medication List        Accurate as of April 03, 2024 10:22 AM. If you have any questions, ask your  nurse or doctor.          ascorbic acid 500 MG tablet Commonly known as: VITAMIN C Take 500 mg by mouth daily.   aspirin  EC 81 MG tablet Take 81 mg by mouth daily.   CALCIUM  1200 PO Take 1,200 mg by mouth daily.   cetirizine 10 MG tablet Commonly known as: ZYRTEC Take 10 mg by mouth daily as needed.   cholecalciferol  1000 units tablet Commonly known as: VITAMIN D  Take 2,000 Units by mouth daily.   clobetasol  ointment 0.05 % Commonly known as: TEMOVATE  APPLY EXTERNALLY TO THE AFFECTED AREA TWICE DAILY   diazepam  5 MG tablet Commonly known as: Valium  One every 6 hours for muscle spasm   ezetimibe  10 MG tablet Commonly known as: ZETIA  TAKE 1 TABLET BY MOUTH DAILY   ibuprofen  800 MG tablet Commonly known as: ADVIL  Take 800 mg by mouth every 8 (eight) hours as needed.   ipratropium 17 MCG/ACT inhaler Commonly known as: ATROVENT  HFA Inhale 2 puffs into the lungs every 6 (six) hours as needed for wheezing.   irbesartan  150 MG tablet Commonly known as: AVAPRO  TAKE 1 TABLET BY MOUTH DAILY   Spiriva  Respimat 2.5 MCG/ACT Aers Generic drug: Tiotropium Bromide  INHALE 2 PUFFS INTO THE LUNGS ONCE DAILY.   traMADol  50 MG tablet Commonly known as: ULTRAM  Take 1 tablet (50 mg total) by mouth 3 (three) times daily as needed.   triamcinolone 55 MCG/ACT Aero nasal inhaler Commonly known as: NASACORT Place 2 sprays into the nose daily as needed (allergies).   TURMERIC CURCUMIN PO Take 1 tablet by mouth daily.         Objective:   BP (!) 165/82   Pulse 82   Ht 5' 3 (1.6 m)   Wt 155 lb (70.3 kg)   SpO2 96%   BMI 27.46 kg/m   Wt Readings from Last 3 Encounters:  04/03/24 155 lb (70.3 kg)  12/24/23 164 lb (74.4 kg)  10/12/23 160 lb 0.9 oz (72.6 kg)    Physical Exam Vitals and nursing note reviewed.  Constitutional:      Appearance: Normal appearance.  Musculoskeletal:        General: No swelling.  Neurological:     Mental Status: She is alert.     Physical Exam   VITALS: BP- 143/80 CHEST: Lungs clear to auscultation. CARDIOVASCULAR: Heart sounds regular.         Assessment & Plan:   Problem List Items Addressed This Visit       Cardiovascular and Mediastinum   HTN (hypertension) - Primary   Relevant Orders   CBC with Differential/Platelet   CMP14+EGFR     Respiratory   COPD  GOLD III    Relevant Orders   CBC with Differential/Platelet     Other   Hyperlipidemia   Relevant Orders   CBC with Differential/Platelet   Lipid panel   Other Visit Diagnoses       Anxiety       Relevant Medications   diazepam  (VALIUM ) 5 MG tablet   Other Relevant Orders   ToxASSURE Select 13 (  MW), Urine           Chronic obstructive pulmonary disease (COPD) COPD is well-managed with Atrovent  and Spiriva . Occasional exertional dyspnea managed by resting. Medication cost is a concern. - Continue Atrovent  as needed, up to twice daily. - Continue Spiriva  daily. - Encourage rest during episodes of dyspnea.  Cervical spine disease with lower extremity symptoms Cervical spine disease causes leg aches and limited walking distance. Neck exercises performed to maintain mobility and prevent progression. - Continue neck exercises to maintain mobility.  Essential hypertension Blood pressure well-controlled at home with readings of 133/80 mmHg. Office reading likely elevated due to anxiety. - Monitor blood pressure at home regularly.  Pure hypercholesterolemia Managed with Zetia  without issues. Previous muscle cramps with statins, but no current issues with Zetia . - Continue Zetia . - Check cholesterol levels today.  Intermittent anxiety Anxiety managed with diazepam  5 mg as needed, used infrequently. Last refill was in 2022. - Refill diazepam  5 mg as needed.          Follow up plan: Return in about 1 year (around 04/03/2025), or if symptoms worsen or fail to improve, for Physical exam and hyperlipidemia and hypertension  recheck.  Counseling provided for all of the vaccine components Orders Placed This Encounter  Procedures   CBC with Differential/Platelet   CMP14+EGFR   Lipid panel   ToxASSURE Select 13 (MW), Urine    Fonda Levins, MD [facility] 04/03/2024, 10:22 AM

## 2024-04-06 LAB — TOXASSURE SELECT 13 (MW), URINE

## 2024-04-10 ENCOUNTER — Ambulatory Visit: Payer: Self-pay | Admitting: Family Medicine

## 2024-04-19 ENCOUNTER — Other Ambulatory Visit: Payer: Self-pay | Admitting: Family Medicine

## 2024-04-23 ENCOUNTER — Other Ambulatory Visit: Payer: Self-pay | Admitting: *Deleted

## 2024-04-27 ENCOUNTER — Other Ambulatory Visit: Payer: Self-pay | Admitting: Family Medicine

## 2024-05-03 ENCOUNTER — Other Ambulatory Visit: Payer: Self-pay | Admitting: *Deleted

## 2024-05-03 DIAGNOSIS — I10 Essential (primary) hypertension: Secondary | ICD-10-CM

## 2024-05-05 ENCOUNTER — Other Ambulatory Visit (HOSPITAL_COMMUNITY): Payer: Self-pay | Admitting: Family Medicine

## 2024-05-05 DIAGNOSIS — Z1231 Encounter for screening mammogram for malignant neoplasm of breast: Secondary | ICD-10-CM

## 2024-05-14 ENCOUNTER — Ambulatory Visit (HOSPITAL_COMMUNITY)
Admission: RE | Admit: 2024-05-14 | Discharge: 2024-05-14 | Disposition: A | Discharge status: Home / Self Care | Source: Ambulatory Visit | Attending: Family Medicine | Admitting: Family Medicine

## 2024-05-14 ENCOUNTER — Other Ambulatory Visit: Payer: Self-pay | Admitting: Family Medicine

## 2024-05-14 DIAGNOSIS — Z1231 Encounter for screening mammogram for malignant neoplasm of breast: Secondary | ICD-10-CM | POA: Diagnosis present

## 2024-07-20 ENCOUNTER — Other Ambulatory Visit: Payer: Self-pay | Admitting: Family Medicine

## 2024-07-20 DIAGNOSIS — I10 Essential (primary) hypertension: Secondary | ICD-10-CM

## 2024-12-24 ENCOUNTER — Ambulatory Visit: Payer: Self-pay

## 2025-04-08 ENCOUNTER — Encounter: Payer: Self-pay | Admitting: Family Medicine
